# Patient Record
Sex: Male | Born: 1935
Health system: Southern US, Community
[De-identification: ages and names within clinical notes are randomized; demographics above are authoritative.]

## PROBLEM LIST (undated history)

## (undated) DIAGNOSIS — I480 Paroxysmal atrial fibrillation: Secondary | ICD-10-CM

## (undated) DIAGNOSIS — E785 Hyperlipidemia, unspecified: Secondary | ICD-10-CM

## (undated) DIAGNOSIS — Z972 Presence of dental prosthetic device (complete) (partial): Secondary | ICD-10-CM

## (undated) DIAGNOSIS — I10 Essential (primary) hypertension: Secondary | ICD-10-CM

## (undated) DIAGNOSIS — K219 Gastro-esophageal reflux disease without esophagitis: Secondary | ICD-10-CM

## (undated) DIAGNOSIS — E119 Type 2 diabetes mellitus without complications: Secondary | ICD-10-CM

## (undated) DIAGNOSIS — M109 Gout, unspecified: Secondary | ICD-10-CM

## (undated) DIAGNOSIS — E039 Hypothyroidism, unspecified: Secondary | ICD-10-CM

## (undated) HISTORY — PX: HERNIA REPAIR: SHX51

## (undated) HISTORY — PX: TONSILLECTOMY: SUR1361

## (undated) HISTORY — PX: COLONOSCOPY: SHX174

---

## 2007-09-18 ENCOUNTER — Ambulatory Visit: Payer: Self-pay | Admitting: Unknown Physician Specialty

## 2011-01-18 ENCOUNTER — Ambulatory Visit: Payer: Self-pay | Admitting: Unknown Physician Specialty

## 2012-08-01 DIAGNOSIS — N411 Chronic prostatitis: Secondary | ICD-10-CM | POA: Insufficient documentation

## 2012-08-01 DIAGNOSIS — N529 Male erectile dysfunction, unspecified: Secondary | ICD-10-CM | POA: Insufficient documentation

## 2012-08-01 DIAGNOSIS — R972 Elevated prostate specific antigen [PSA]: Secondary | ICD-10-CM | POA: Insufficient documentation

## 2012-08-01 DIAGNOSIS — N390 Urinary tract infection, site not specified: Secondary | ICD-10-CM | POA: Insufficient documentation

## 2012-08-01 DIAGNOSIS — R339 Retention of urine, unspecified: Secondary | ICD-10-CM | POA: Insufficient documentation

## 2012-08-01 DIAGNOSIS — N138 Other obstructive and reflux uropathy: Secondary | ICD-10-CM | POA: Insufficient documentation

## 2012-08-01 DIAGNOSIS — N509 Disorder of male genital organs, unspecified: Secondary | ICD-10-CM | POA: Insufficient documentation

## 2015-11-13 DIAGNOSIS — E1122 Type 2 diabetes mellitus with diabetic chronic kidney disease: Secondary | ICD-10-CM | POA: Diagnosis not present

## 2015-11-13 DIAGNOSIS — R195 Other fecal abnormalities: Secondary | ICD-10-CM | POA: Diagnosis not present

## 2015-11-13 DIAGNOSIS — N183 Chronic kidney disease, stage 3 (moderate): Secondary | ICD-10-CM | POA: Diagnosis not present

## 2015-11-13 DIAGNOSIS — R0789 Other chest pain: Secondary | ICD-10-CM | POA: Diagnosis not present

## 2015-11-13 DIAGNOSIS — N400 Enlarged prostate without lower urinary tract symptoms: Secondary | ICD-10-CM | POA: Diagnosis not present

## 2015-11-13 DIAGNOSIS — Z794 Long term (current) use of insulin: Secondary | ICD-10-CM | POA: Diagnosis not present

## 2015-11-13 DIAGNOSIS — I10 Essential (primary) hypertension: Secondary | ICD-10-CM | POA: Diagnosis not present

## 2015-11-13 DIAGNOSIS — E7800 Pure hypercholesterolemia, unspecified: Secondary | ICD-10-CM | POA: Diagnosis not present

## 2015-11-20 DIAGNOSIS — Z0000 Encounter for general adult medical examination without abnormal findings: Secondary | ICD-10-CM | POA: Diagnosis not present

## 2015-11-20 DIAGNOSIS — Z794 Long term (current) use of insulin: Secondary | ICD-10-CM | POA: Insufficient documentation

## 2015-11-20 DIAGNOSIS — E1122 Type 2 diabetes mellitus with diabetic chronic kidney disease: Secondary | ICD-10-CM | POA: Insufficient documentation

## 2015-11-20 DIAGNOSIS — I10 Essential (primary) hypertension: Secondary | ICD-10-CM | POA: Diagnosis not present

## 2015-11-20 DIAGNOSIS — E039 Hypothyroidism, unspecified: Secondary | ICD-10-CM | POA: Diagnosis not present

## 2015-11-20 DIAGNOSIS — Z23 Encounter for immunization: Secondary | ICD-10-CM | POA: Diagnosis not present

## 2015-11-20 DIAGNOSIS — E1140 Type 2 diabetes mellitus with diabetic neuropathy, unspecified: Secondary | ICD-10-CM | POA: Diagnosis not present

## 2015-11-20 DIAGNOSIS — E7800 Pure hypercholesterolemia, unspecified: Secondary | ICD-10-CM | POA: Diagnosis not present

## 2016-03-11 DIAGNOSIS — E039 Hypothyroidism, unspecified: Secondary | ICD-10-CM | POA: Diagnosis not present

## 2016-03-11 DIAGNOSIS — E1140 Type 2 diabetes mellitus with diabetic neuropathy, unspecified: Secondary | ICD-10-CM | POA: Diagnosis not present

## 2016-03-11 DIAGNOSIS — E7800 Pure hypercholesterolemia, unspecified: Secondary | ICD-10-CM | POA: Diagnosis not present

## 2016-03-11 DIAGNOSIS — Z23 Encounter for immunization: Secondary | ICD-10-CM | POA: Diagnosis not present

## 2016-03-11 DIAGNOSIS — Z794 Long term (current) use of insulin: Secondary | ICD-10-CM | POA: Diagnosis not present

## 2016-03-11 DIAGNOSIS — I10 Essential (primary) hypertension: Secondary | ICD-10-CM | POA: Diagnosis not present

## 2016-03-11 DIAGNOSIS — Z0000 Encounter for general adult medical examination without abnormal findings: Secondary | ICD-10-CM | POA: Diagnosis not present

## 2016-03-18 DIAGNOSIS — E1140 Type 2 diabetes mellitus with diabetic neuropathy, unspecified: Secondary | ICD-10-CM | POA: Diagnosis not present

## 2016-03-18 DIAGNOSIS — Z794 Long term (current) use of insulin: Secondary | ICD-10-CM | POA: Diagnosis not present

## 2016-03-18 DIAGNOSIS — E1122 Type 2 diabetes mellitus with diabetic chronic kidney disease: Secondary | ICD-10-CM | POA: Diagnosis not present

## 2016-03-18 DIAGNOSIS — N400 Enlarged prostate without lower urinary tract symptoms: Secondary | ICD-10-CM | POA: Diagnosis not present

## 2016-03-18 DIAGNOSIS — Z23 Encounter for immunization: Secondary | ICD-10-CM | POA: Diagnosis not present

## 2016-03-18 DIAGNOSIS — N183 Chronic kidney disease, stage 3 (moderate): Secondary | ICD-10-CM | POA: Diagnosis not present

## 2016-03-18 DIAGNOSIS — I10 Essential (primary) hypertension: Secondary | ICD-10-CM | POA: Diagnosis not present

## 2016-03-18 DIAGNOSIS — E039 Hypothyroidism, unspecified: Secondary | ICD-10-CM | POA: Diagnosis not present

## 2016-03-18 DIAGNOSIS — E7800 Pure hypercholesterolemia, unspecified: Secondary | ICD-10-CM | POA: Diagnosis not present

## 2016-05-19 DIAGNOSIS — I10 Essential (primary) hypertension: Secondary | ICD-10-CM | POA: Diagnosis not present

## 2016-05-19 DIAGNOSIS — E119 Type 2 diabetes mellitus without complications: Secondary | ICD-10-CM | POA: Diagnosis not present

## 2016-05-19 DIAGNOSIS — E7800 Pure hypercholesterolemia, unspecified: Secondary | ICD-10-CM | POA: Diagnosis not present

## 2016-05-19 DIAGNOSIS — M109 Gout, unspecified: Secondary | ICD-10-CM | POA: Diagnosis not present

## 2016-05-25 DIAGNOSIS — K921 Melena: Secondary | ICD-10-CM | POA: Diagnosis not present

## 2016-05-26 DIAGNOSIS — N411 Chronic prostatitis: Secondary | ICD-10-CM | POA: Diagnosis not present

## 2016-05-26 DIAGNOSIS — R339 Retention of urine, unspecified: Secondary | ICD-10-CM | POA: Diagnosis not present

## 2016-05-26 DIAGNOSIS — R972 Elevated prostate specific antigen [PSA]: Secondary | ICD-10-CM | POA: Diagnosis not present

## 2016-05-26 DIAGNOSIS — N403 Nodular prostate with lower urinary tract symptoms: Secondary | ICD-10-CM | POA: Diagnosis not present

## 2016-05-26 DIAGNOSIS — N138 Other obstructive and reflux uropathy: Secondary | ICD-10-CM | POA: Diagnosis not present

## 2016-05-31 DIAGNOSIS — K921 Melena: Secondary | ICD-10-CM | POA: Diagnosis not present

## 2016-06-23 DIAGNOSIS — E119 Type 2 diabetes mellitus without complications: Secondary | ICD-10-CM | POA: Diagnosis not present

## 2016-06-28 DIAGNOSIS — D649 Anemia, unspecified: Secondary | ICD-10-CM | POA: Diagnosis not present

## 2016-07-15 DIAGNOSIS — I10 Essential (primary) hypertension: Secondary | ICD-10-CM | POA: Diagnosis not present

## 2016-07-15 DIAGNOSIS — Z794 Long term (current) use of insulin: Secondary | ICD-10-CM | POA: Diagnosis not present

## 2016-07-15 DIAGNOSIS — E1140 Type 2 diabetes mellitus with diabetic neuropathy, unspecified: Secondary | ICD-10-CM | POA: Diagnosis not present

## 2016-07-15 DIAGNOSIS — N400 Enlarged prostate without lower urinary tract symptoms: Secondary | ICD-10-CM | POA: Diagnosis not present

## 2016-07-15 DIAGNOSIS — E7800 Pure hypercholesterolemia, unspecified: Secondary | ICD-10-CM | POA: Diagnosis not present

## 2016-07-18 DIAGNOSIS — E039 Hypothyroidism, unspecified: Secondary | ICD-10-CM | POA: Diagnosis not present

## 2016-07-18 DIAGNOSIS — Z8739 Personal history of other diseases of the musculoskeletal system and connective tissue: Secondary | ICD-10-CM | POA: Diagnosis not present

## 2016-07-18 DIAGNOSIS — E119 Type 2 diabetes mellitus without complications: Secondary | ICD-10-CM | POA: Diagnosis not present

## 2016-07-18 DIAGNOSIS — I10 Essential (primary) hypertension: Secondary | ICD-10-CM | POA: Diagnosis not present

## 2016-07-18 DIAGNOSIS — Z794 Long term (current) use of insulin: Secondary | ICD-10-CM | POA: Diagnosis not present

## 2016-07-18 DIAGNOSIS — E7800 Pure hypercholesterolemia, unspecified: Secondary | ICD-10-CM | POA: Diagnosis not present

## 2016-07-18 DIAGNOSIS — Z0000 Encounter for general adult medical examination without abnormal findings: Secondary | ICD-10-CM | POA: Diagnosis not present

## 2016-11-17 DIAGNOSIS — I10 Essential (primary) hypertension: Secondary | ICD-10-CM | POA: Diagnosis not present

## 2016-11-17 DIAGNOSIS — Z8739 Personal history of other diseases of the musculoskeletal system and connective tissue: Secondary | ICD-10-CM | POA: Diagnosis not present

## 2016-11-17 DIAGNOSIS — E7800 Pure hypercholesterolemia, unspecified: Secondary | ICD-10-CM | POA: Diagnosis not present

## 2016-11-17 DIAGNOSIS — E039 Hypothyroidism, unspecified: Secondary | ICD-10-CM | POA: Diagnosis not present

## 2016-11-17 DIAGNOSIS — E119 Type 2 diabetes mellitus without complications: Secondary | ICD-10-CM | POA: Diagnosis not present

## 2016-11-17 DIAGNOSIS — Z794 Long term (current) use of insulin: Secondary | ICD-10-CM | POA: Diagnosis not present

## 2016-11-24 DIAGNOSIS — E119 Type 2 diabetes mellitus without complications: Secondary | ICD-10-CM | POA: Diagnosis not present

## 2016-11-24 DIAGNOSIS — E7800 Pure hypercholesterolemia, unspecified: Secondary | ICD-10-CM | POA: Diagnosis not present

## 2016-11-24 DIAGNOSIS — D649 Anemia, unspecified: Secondary | ICD-10-CM | POA: Diagnosis not present

## 2016-11-24 DIAGNOSIS — I10 Essential (primary) hypertension: Secondary | ICD-10-CM | POA: Diagnosis not present

## 2016-11-24 DIAGNOSIS — Z8739 Personal history of other diseases of the musculoskeletal system and connective tissue: Secondary | ICD-10-CM | POA: Diagnosis not present

## 2016-11-24 DIAGNOSIS — Z0000 Encounter for general adult medical examination without abnormal findings: Secondary | ICD-10-CM | POA: Diagnosis not present

## 2016-11-24 DIAGNOSIS — Z794 Long term (current) use of insulin: Secondary | ICD-10-CM | POA: Diagnosis not present

## 2017-03-22 DIAGNOSIS — E119 Type 2 diabetes mellitus without complications: Secondary | ICD-10-CM | POA: Diagnosis not present

## 2017-03-22 DIAGNOSIS — Z8739 Personal history of other diseases of the musculoskeletal system and connective tissue: Secondary | ICD-10-CM | POA: Diagnosis not present

## 2017-03-22 DIAGNOSIS — I10 Essential (primary) hypertension: Secondary | ICD-10-CM | POA: Diagnosis not present

## 2017-03-22 DIAGNOSIS — D649 Anemia, unspecified: Secondary | ICD-10-CM | POA: Diagnosis not present

## 2017-03-22 DIAGNOSIS — Z0000 Encounter for general adult medical examination without abnormal findings: Secondary | ICD-10-CM | POA: Diagnosis not present

## 2017-03-22 DIAGNOSIS — Z794 Long term (current) use of insulin: Secondary | ICD-10-CM | POA: Diagnosis not present

## 2017-03-22 DIAGNOSIS — E7800 Pure hypercholesterolemia, unspecified: Secondary | ICD-10-CM | POA: Diagnosis not present

## 2017-03-29 DIAGNOSIS — E039 Hypothyroidism, unspecified: Secondary | ICD-10-CM | POA: Diagnosis not present

## 2017-03-29 DIAGNOSIS — Z794 Long term (current) use of insulin: Secondary | ICD-10-CM | POA: Diagnosis not present

## 2017-03-29 DIAGNOSIS — E7800 Pure hypercholesterolemia, unspecified: Secondary | ICD-10-CM | POA: Diagnosis not present

## 2017-03-29 DIAGNOSIS — E1122 Type 2 diabetes mellitus with diabetic chronic kidney disease: Secondary | ICD-10-CM | POA: Diagnosis not present

## 2017-03-29 DIAGNOSIS — I10 Essential (primary) hypertension: Secondary | ICD-10-CM | POA: Diagnosis not present

## 2017-03-29 DIAGNOSIS — Z8739 Personal history of other diseases of the musculoskeletal system and connective tissue: Secondary | ICD-10-CM | POA: Diagnosis not present

## 2017-03-29 DIAGNOSIS — N183 Chronic kidney disease, stage 3 (moderate): Secondary | ICD-10-CM | POA: Diagnosis not present

## 2017-07-24 DIAGNOSIS — N183 Chronic kidney disease, stage 3 (moderate): Secondary | ICD-10-CM | POA: Diagnosis not present

## 2017-07-24 DIAGNOSIS — E7800 Pure hypercholesterolemia, unspecified: Secondary | ICD-10-CM | POA: Diagnosis not present

## 2017-07-24 DIAGNOSIS — I10 Essential (primary) hypertension: Secondary | ICD-10-CM | POA: Diagnosis not present

## 2017-07-24 DIAGNOSIS — Z794 Long term (current) use of insulin: Secondary | ICD-10-CM | POA: Diagnosis not present

## 2017-07-24 DIAGNOSIS — E1122 Type 2 diabetes mellitus with diabetic chronic kidney disease: Secondary | ICD-10-CM | POA: Diagnosis not present

## 2017-07-24 DIAGNOSIS — Z8739 Personal history of other diseases of the musculoskeletal system and connective tissue: Secondary | ICD-10-CM | POA: Diagnosis not present

## 2017-07-24 DIAGNOSIS — E039 Hypothyroidism, unspecified: Secondary | ICD-10-CM | POA: Diagnosis not present

## 2017-07-31 DIAGNOSIS — Z8739 Personal history of other diseases of the musculoskeletal system and connective tissue: Secondary | ICD-10-CM | POA: Diagnosis not present

## 2017-07-31 DIAGNOSIS — G8929 Other chronic pain: Secondary | ICD-10-CM | POA: Diagnosis not present

## 2017-07-31 DIAGNOSIS — E7800 Pure hypercholesterolemia, unspecified: Secondary | ICD-10-CM | POA: Diagnosis not present

## 2017-07-31 DIAGNOSIS — M25512 Pain in left shoulder: Secondary | ICD-10-CM | POA: Diagnosis not present

## 2017-07-31 DIAGNOSIS — I10 Essential (primary) hypertension: Secondary | ICD-10-CM | POA: Diagnosis not present

## 2017-07-31 DIAGNOSIS — E1140 Type 2 diabetes mellitus with diabetic neuropathy, unspecified: Secondary | ICD-10-CM | POA: Diagnosis not present

## 2017-07-31 DIAGNOSIS — Z794 Long term (current) use of insulin: Secondary | ICD-10-CM | POA: Diagnosis not present

## 2017-08-03 DIAGNOSIS — N411 Chronic prostatitis: Secondary | ICD-10-CM | POA: Diagnosis not present

## 2017-08-03 DIAGNOSIS — R972 Elevated prostate specific antigen [PSA]: Secondary | ICD-10-CM | POA: Diagnosis not present

## 2017-08-03 DIAGNOSIS — R339 Retention of urine, unspecified: Secondary | ICD-10-CM | POA: Diagnosis not present

## 2017-08-03 DIAGNOSIS — N138 Other obstructive and reflux uropathy: Secondary | ICD-10-CM | POA: Diagnosis not present

## 2017-08-03 DIAGNOSIS — N403 Nodular prostate with lower urinary tract symptoms: Secondary | ICD-10-CM | POA: Diagnosis not present

## 2017-08-04 DIAGNOSIS — H2513 Age-related nuclear cataract, bilateral: Secondary | ICD-10-CM | POA: Diagnosis not present

## 2018-02-22 DIAGNOSIS — H2513 Age-related nuclear cataract, bilateral: Secondary | ICD-10-CM | POA: Diagnosis not present

## 2018-02-23 DIAGNOSIS — H2512 Age-related nuclear cataract, left eye: Secondary | ICD-10-CM | POA: Diagnosis not present

## 2018-03-09 DIAGNOSIS — E1140 Type 2 diabetes mellitus with diabetic neuropathy, unspecified: Secondary | ICD-10-CM | POA: Diagnosis not present

## 2018-03-09 DIAGNOSIS — Z794 Long term (current) use of insulin: Secondary | ICD-10-CM | POA: Diagnosis not present

## 2018-03-09 DIAGNOSIS — Z8739 Personal history of other diseases of the musculoskeletal system and connective tissue: Secondary | ICD-10-CM | POA: Diagnosis not present

## 2018-03-09 DIAGNOSIS — G8929 Other chronic pain: Secondary | ICD-10-CM | POA: Diagnosis not present

## 2018-03-09 DIAGNOSIS — I10 Essential (primary) hypertension: Secondary | ICD-10-CM | POA: Diagnosis not present

## 2018-03-09 DIAGNOSIS — E7800 Pure hypercholesterolemia, unspecified: Secondary | ICD-10-CM | POA: Diagnosis not present

## 2018-03-09 DIAGNOSIS — M25512 Pain in left shoulder: Secondary | ICD-10-CM | POA: Diagnosis not present

## 2018-03-16 DIAGNOSIS — I10 Essential (primary) hypertension: Secondary | ICD-10-CM | POA: Diagnosis not present

## 2018-03-16 DIAGNOSIS — Z0000 Encounter for general adult medical examination without abnormal findings: Secondary | ICD-10-CM | POA: Diagnosis not present

## 2018-03-16 DIAGNOSIS — E1140 Type 2 diabetes mellitus with diabetic neuropathy, unspecified: Secondary | ICD-10-CM | POA: Diagnosis not present

## 2018-03-16 DIAGNOSIS — Z794 Long term (current) use of insulin: Secondary | ICD-10-CM | POA: Diagnosis not present

## 2018-03-16 DIAGNOSIS — Z8739 Personal history of other diseases of the musculoskeletal system and connective tissue: Secondary | ICD-10-CM | POA: Diagnosis not present

## 2018-03-16 DIAGNOSIS — N400 Enlarged prostate without lower urinary tract symptoms: Secondary | ICD-10-CM | POA: Diagnosis not present

## 2018-03-16 DIAGNOSIS — E7800 Pure hypercholesterolemia, unspecified: Secondary | ICD-10-CM | POA: Diagnosis not present

## 2018-04-03 DIAGNOSIS — H2512 Age-related nuclear cataract, left eye: Secondary | ICD-10-CM | POA: Diagnosis not present

## 2018-04-09 ENCOUNTER — Encounter: Payer: Self-pay | Admitting: *Deleted

## 2018-04-09 ENCOUNTER — Other Ambulatory Visit: Payer: Self-pay

## 2018-04-09 DIAGNOSIS — I10 Essential (primary) hypertension: Secondary | ICD-10-CM | POA: Insufficient documentation

## 2018-04-09 DIAGNOSIS — E039 Hypothyroidism, unspecified: Secondary | ICD-10-CM | POA: Insufficient documentation

## 2018-04-09 DIAGNOSIS — I152 Hypertension secondary to endocrine disorders: Secondary | ICD-10-CM | POA: Insufficient documentation

## 2018-04-09 DIAGNOSIS — E1169 Type 2 diabetes mellitus with other specified complication: Secondary | ICD-10-CM | POA: Insufficient documentation

## 2018-04-09 DIAGNOSIS — E785 Hyperlipidemia, unspecified: Secondary | ICD-10-CM | POA: Insufficient documentation

## 2018-04-09 DIAGNOSIS — N400 Enlarged prostate without lower urinary tract symptoms: Secondary | ICD-10-CM | POA: Insufficient documentation

## 2018-04-09 NOTE — Progress Notes (Signed)
04/10/2018 10:20 AM   Glen Martinez [birth year] [id number]  Referring provider: Tracie Harrier, MD [address] [facility] [place], [place] [address]  Chief Complaint  Patient presents with  . Benign Prostatic Hypertrophy    HPI: Patient is an 84 year old male with BPH who was referred by Dr. Ginette Martinez for further evaluation.  His only complaint is intermittency that occurs occasionally.  He is currently on tamsulosin 0.4 mg daily and finasteride daily.  Patient denies any gross hematuria, dysuria or suprapubic/flank pain.  Patient denies any fevers, chills, nausea or vomiting.   His PVR 45 mL.    He does not have a history of prostate surgery, nephrolithiasis or family history of prostate cancer.    PMH: Past Medical History:  Diagnosis Date  . Diabetes mellitus without complication ([place])   . GERD (gastroesophageal reflux disease)   . Gout   . Hyperlipidemia   . Hypertension   . Hypothyroidism   . Wears dentures    partial upper and lower    Surgical History: Past Surgical History:  Procedure Laterality Date  . COLONOSCOPY    . HERNIA REPAIR    . TONSILLECTOMY      Home Medications:  Allergies as of 04/10/2018   No Known Allergies     Medication List        Accurate as of 04/10/18 10:20 AM. Always use your most recent med list.          allopurinol 100 MG tablet Commonly known as:  ZYLOPRIM Take 100 mg by mouth daily.   aspirin 81 MG tablet Take 81 mg by mouth daily.   finasteride 5 MG tablet Commonly known as:  PROSCAR Take 5 mg by mouth daily.   gabapentin 100 MG capsule Commonly known as:  NEURONTIN Take 100 mg by mouth daily.   insulin NPH-regular Human (70-30) 100 UNIT/ML injection Commonly known as:  NOVOLIN 70/30 Inject into the skin. 28 units at breakfast. 8 units at bedtime.   levothyroxine 75 MCG tablet Commonly known as:  SYNTHROID, LEVOTHROID Take 75 mcg by mouth daily before breakfast.   lisinopril 40 MG  tablet Commonly known as:  PRINIVIL,ZESTRIL Take 40 mg by mouth daily.   metoprolol tartrate 100 MG tablet Commonly known as:  LOPRESSOR Take 100 mg by mouth daily.   omeprazole 20 MG capsule Commonly known as:  PRILOSEC Take 20 mg by mouth daily.   simvastatin 40 MG tablet Commonly known as:  ZOCOR Take 40 mg by mouth daily.       Allergies: No Known Allergies  Family History: History reviewed. No pertinent family history.  Social History:  reports that he has never smoked. He has never used smokeless tobacco. He reports that he drinks alcohol. His drug history is not on file.  ROS: UROLOGY Frequent Urination?: No Hard to postpone urination?: No Burning/pain with urination?: No Get up at night to urinate?: No Leakage of urine?: No Urine stream starts and stops?: No Trouble starting stream?: No Do you have to strain to urinate?: No Blood in urine?: No Urinary tract infection?: No Sexually transmitted disease?: No Injury to kidneys or bladder?: No Painful intercourse?: No Weak stream?: No Erection problems?: No Penile pain?: No  Gastrointestinal Nausea?: No Vomiting?: No Indigestion/heartburn?: No Diarrhea?: No Constipation?: No  Constitutional Fever: No Night sweats?: No Weight loss?: No Fatigue?: No  Skin Skin rash/lesions?: No Itching?: No  Eyes Blurred vision?: Yes Double vision?: No  Ears/Nose/Throat Sore throat?: No Sinus  problems?: No  Hematologic/Lymphatic Swollen glands?: No Easy bruising?: No  Cardiovascular Leg swelling?: No Chest pain?: No  Respiratory Cough?: No Shortness of breath?: No  Endocrine Excessive thirst?: No  Musculoskeletal Back pain?: No Joint pain?: No  Neurological Headaches?: No Dizziness?: No  Psychologic Depression?: No Anxiety?: No  Physical Exam: BP (!) 195/81   Pulse (!) 59   Resp 16   Ht 5\' 6"  (1.676 m)   Wt 188 lb 9.6 oz (85.5 kg)   SpO2 96%   BMI 30.44 kg/m   Constitutional:   Well nourished. Alert and oriented, No acute distress. HEENT: Glen Martinez AT, moist mucus membranes.  Trachea midline, no masses. Cardiovascular: No clubbing, cyanosis, or edema. Respiratory: Normal respiratory effort, no increased work of breathing. GI: Abdomen is soft, non tender, non distended, no abdominal masses. Liver and spleen not palpable.  No hernias appreciated.  Stool sample for occult testing is not indicated.   GU: No CVA tenderness.  No bladder fullness or masses.  Patient with uncircumcised phallus.  Foreskin easily retracted.   Urethral meatus is patent.  No penile discharge. No penile lesions or rashes. Scrotum without lesions, cysts, rashes and/or edema.  Testicles are located scrotally bilaterally. No masses are appreciated in the testicles. Left and right epididymis are normal. Rectal: Patient with  normal sphincter tone. Anus and perineum without scarring or rashes. No rectal masses are appreciated. Prostate is approximately 60 grams, 3 mm x 5 mm nodule is appreciated on the ridge of the right lobe.  Seminal vesicles are normal. Skin: No rashes, bruises or suspicious lesions. Lymph: No cervical or inguinal adenopathy. Neurologic: Grossly intact, no focal deficits, moving all 4 extremities. Psychiatric: Normal mood and affect.  Laboratory Data: No results found for: WBC, HGB, HCT, MCV, PLT  No results found for: CREATININE  No results found for: PSA  No results found for: TESTOSTERONE  No results found for: HGBA1C  No results found for: TSH  No results found for: CHOL, HDL, CHOLHDL, VLDL, LDLCALC  No results found for: AST No results found for: ALT No components found for: ALKALINEPHOPHATASE No components found for: BILIRUBINTOTAL  No results found for: ESTRADIOL  Urinalysis No results found for: COLORURINE, APPEARANCEUR, LABSPEC, PHURINE, GLUCOSEU, HGBUR, BILIRUBINUR, KETONESUR, PROTEINUR, UROBILINOGEN, NITRITE, LEUKOCYTESUR  I have reviewed the labs.   Pertinent  Imaging: Results for Glen Martinez (MRN [id number]) as of 04/10/2018 10:20  Ref. Range 04/10/2018 10:02  Scan Result Unknown 31ml    Assessment & Plan:    1. BPH with LUTS Continue conservative management, avoiding bladder irritants and timed voiding's Continue tamsulosin 0.4 mg daily and finasteride 5 mg daily RTC in 6 months for IPSS, PSA and exam   2. Prostate nodule Stable per Dr. Bjorn Loser exam Discussed with patient that this may be a prostate cancer and the treatment of prostate cancer would be geared toward palliative care and not curative and it would be appropriate to continue monitoring the patient with biannual DRE's and PSA as long as they remain stable RTC in 6 months for DRE      Return in about 6 months (around 10/11/2018) for IPSS, PSA and exam.  These notes generated with voice recognition software. I apologize for typographical errors.  Zara Council, PA-C  [facility] [address]  [place],  [address] [phone]

## 2018-04-09 NOTE — Discharge Instructions (Signed)

## 2018-04-10 ENCOUNTER — Encounter: Payer: Self-pay | Admitting: Urology

## 2018-04-10 ENCOUNTER — Encounter: Payer: Self-pay | Admitting: Anesthesiology

## 2018-04-10 ENCOUNTER — Ambulatory Visit: Payer: PPO | Admitting: Urology

## 2018-04-10 VITALS — BP 195/81 | HR 59 | Resp 16 | Ht 66.0 in | Wt 188.6 lb

## 2018-04-10 DIAGNOSIS — N402 Nodular prostate without lower urinary tract symptoms: Secondary | ICD-10-CM | POA: Diagnosis not present

## 2018-04-10 DIAGNOSIS — N401 Enlarged prostate with lower urinary tract symptoms: Secondary | ICD-10-CM

## 2018-04-10 LAB — BLADDER SCAN AMB NON-IMAGING

## 2018-04-11 ENCOUNTER — Telehealth: Payer: Self-pay

## 2018-04-11 LAB — PSA: PROSTATE SPECIFIC AG, SERUM: 2.7 ng/mL (ref 0.0–4.0)

## 2018-04-11 NOTE — Telephone Encounter (Signed)
-----   Message from Nori Riis, PA-C sent at 04/11/2018  7:55 AM EDT ----- Please let [name] know that his PSA is stable and we will see him in 6 months.

## 2018-04-11 NOTE — Telephone Encounter (Signed)
Called pt, informed him of lab results. Pt gave verbal understanding.

## 2018-04-17 ENCOUNTER — Encounter
Admission: RE | Disposition: A | Discharge status: Home / Self Care | Payer: Self-pay | Source: Ambulatory Visit | Attending: Ophthalmology

## 2018-04-17 ENCOUNTER — Ambulatory Visit
Admission: RE | Admit: 2018-04-17 | Discharge: 2018-04-17 | Disposition: A | Discharge status: Home / Self Care | Payer: PPO | Source: Ambulatory Visit | Attending: Ophthalmology | Admitting: Ophthalmology

## 2018-04-17 DIAGNOSIS — E039 Hypothyroidism, unspecified: Secondary | ICD-10-CM | POA: Diagnosis not present

## 2018-04-17 DIAGNOSIS — Z538 Procedure and treatment not carried out for other reasons: Secondary | ICD-10-CM | POA: Insufficient documentation

## 2018-04-17 DIAGNOSIS — E7800 Pure hypercholesterolemia, unspecified: Secondary | ICD-10-CM | POA: Diagnosis not present

## 2018-04-17 DIAGNOSIS — I10 Essential (primary) hypertension: Secondary | ICD-10-CM | POA: Diagnosis not present

## 2018-04-17 DIAGNOSIS — H269 Unspecified cataract: Secondary | ICD-10-CM | POA: Diagnosis not present

## 2018-04-17 DIAGNOSIS — N183 Chronic kidney disease, stage 3 (moderate): Secondary | ICD-10-CM | POA: Diagnosis not present

## 2018-04-17 DIAGNOSIS — E1122 Type 2 diabetes mellitus with diabetic chronic kidney disease: Secondary | ICD-10-CM | POA: Diagnosis not present

## 2018-04-17 DIAGNOSIS — Z794 Long term (current) use of insulin: Secondary | ICD-10-CM | POA: Diagnosis not present

## 2018-04-17 HISTORY — DX: Gastro-esophageal reflux disease without esophagitis: K21.9

## 2018-04-17 HISTORY — DX: Presence of dental prosthetic device (complete) (partial): Z97.2

## 2018-04-17 HISTORY — DX: Type 2 diabetes mellitus without complications: E11.9

## 2018-04-17 HISTORY — DX: Hyperlipidemia, unspecified: E78.5

## 2018-04-17 HISTORY — DX: Hypothyroidism, unspecified: E03.9

## 2018-04-17 HISTORY — DX: Essential (primary) hypertension: I10

## 2018-04-17 HISTORY — DX: Gout, unspecified: M10.9

## 2018-04-17 LAB — GLUCOSE, CAPILLARY: Glucose-Capillary: 87 mg/dL (ref 70–99)

## 2018-04-17 SURGERY — PHACOEMULSIFICATION, CATARACT, WITH IOL INSERTION
Anesthesia: Topical | Laterality: Left

## 2018-04-17 SURGICAL SUPPLY — 24 items

## 2018-04-17 NOTE — Progress Notes (Signed)
Canceled due to high blood pressure

## 2018-04-17 NOTE — H&P (Signed)
The History and Physical notes are on paper, have been signed, and are to be scanned.    Surgery canceled due to elevated BP.  Glen Martinez 04/17/2018 10:06 AM

## 2018-04-25 NOTE — Discharge Instructions (Signed)

## 2018-04-30 ENCOUNTER — Encounter: Payer: Self-pay | Admitting: *Deleted

## 2018-04-30 ENCOUNTER — Other Ambulatory Visit: Payer: Self-pay

## 2018-05-02 ENCOUNTER — Ambulatory Visit: Payer: PPO | Admitting: Student in an Organized Health Care Education/Training Program

## 2018-05-02 ENCOUNTER — Ambulatory Visit
Admission: RE | Admit: 2018-05-02 | Discharge: 2018-05-02 | Disposition: A | Discharge status: Home / Self Care | Payer: PPO | Source: Ambulatory Visit | Attending: Ophthalmology | Admitting: Ophthalmology

## 2018-05-02 ENCOUNTER — Encounter
Admission: RE | Disposition: A | Discharge status: Home / Self Care | Payer: Self-pay | Source: Ambulatory Visit | Attending: Ophthalmology

## 2018-05-02 DIAGNOSIS — Z794 Long term (current) use of insulin: Secondary | ICD-10-CM | POA: Insufficient documentation

## 2018-05-02 DIAGNOSIS — H25812 Combined forms of age-related cataract, left eye: Secondary | ICD-10-CM | POA: Diagnosis not present

## 2018-05-02 DIAGNOSIS — E039 Hypothyroidism, unspecified: Secondary | ICD-10-CM | POA: Insufficient documentation

## 2018-05-02 DIAGNOSIS — I10 Essential (primary) hypertension: Secondary | ICD-10-CM | POA: Insufficient documentation

## 2018-05-02 DIAGNOSIS — E1136 Type 2 diabetes mellitus with diabetic cataract: Secondary | ICD-10-CM | POA: Insufficient documentation

## 2018-05-02 DIAGNOSIS — K219 Gastro-esophageal reflux disease without esophagitis: Secondary | ICD-10-CM | POA: Insufficient documentation

## 2018-05-02 DIAGNOSIS — H2512 Age-related nuclear cataract, left eye: Secondary | ICD-10-CM | POA: Insufficient documentation

## 2018-05-02 DIAGNOSIS — Z79899 Other long term (current) drug therapy: Secondary | ICD-10-CM | POA: Diagnosis not present

## 2018-05-02 DIAGNOSIS — E7800 Pure hypercholesterolemia, unspecified: Secondary | ICD-10-CM | POA: Diagnosis not present

## 2018-05-02 HISTORY — PX: CATARACT EXTRACTION W/PHACO: SHX586

## 2018-05-02 LAB — GLUCOSE, CAPILLARY: GLUCOSE-CAPILLARY: 148 mg/dL — AB (ref 70–99)

## 2018-05-02 SURGERY — PHACOEMULSIFICATION, CATARACT, WITH IOL INSERTION
Anesthesia: Monitor Anesthesia Care | Site: Eye | Laterality: Left | Wound class: "Clean "

## 2018-05-02 MED ORDER — NA HYALUR & NA CHOND-NA HYALUR 0.4-0.35 ML IO KIT
PACK | INTRAOCULAR | Status: DC | PRN
Start: 2018-05-02 — End: 2018-05-02
  Administered 2018-05-02: 1 mL via INTRAOCULAR

## 2018-05-02 MED ORDER — MOXIFLOXACIN HCL 0.5 % OP SOLN
1.0000 [drp] | OPHTHALMIC | Status: DC | PRN
  Administered 2018-05-02 (×3): 1 [drp] via OPHTHALMIC

## 2018-05-02 MED ORDER — EPINEPHRINE PF 1 MG/ML IJ SOLN
INTRAOCULAR | Status: DC | PRN
  Administered 2018-05-02: 68 mL via OPHTHALMIC

## 2018-05-02 MED ORDER — LIDOCAINE HCL (PF) 2 % IJ SOLN
INTRAOCULAR | Status: DC | PRN
  Administered 2018-05-02: 1 mL via INTRAMUSCULAR

## 2018-05-02 MED ORDER — PHENYLEPHRINE HCL 10 % OP SOLN
1.0000 [drp] | OPHTHALMIC | Status: DC | PRN
  Administered 2018-05-02 (×3): 1 [drp] via OPHTHALMIC

## 2018-05-02 MED ORDER — MIDAZOLAM HCL 2 MG/2ML IJ SOLN
INTRAMUSCULAR | Status: DC | PRN
  Administered 2018-05-02: 1 mg via INTRAVENOUS

## 2018-05-02 MED ORDER — CYCLOPENTOLATE HCL 2 % OP SOLN
1.0000 [drp] | OPHTHALMIC | Status: DC | PRN
  Administered 2018-05-02 (×3): 1 [drp] via OPHTHALMIC

## 2018-05-02 MED ORDER — CEFUROXIME OPHTHALMIC INJECTION 1 MG/0.1 ML
INJECTION | OPHTHALMIC | Status: DC | PRN
  Administered 2018-05-02: .3 mL via OPHTHALMIC

## 2018-05-02 MED ORDER — BRIMONIDINE TARTRATE-TIMOLOL 0.2-0.5 % OP SOLN
OPHTHALMIC | Status: DC | PRN
Start: 2018-05-02 — End: 2018-05-02
  Administered 2018-05-02: 1 [drp] via OPHTHALMIC

## 2018-05-02 MED ORDER — FENTANYL CITRATE (PF) 100 MCG/2ML IJ SOLN
INTRAMUSCULAR | Status: DC | PRN
  Administered 2018-05-02: 50 ug via INTRAVENOUS

## 2018-05-02 MED ORDER — TETRACAINE HCL 0.5 % OP SOLN
1.0000 [drp] | OPHTHALMIC | Status: DC | PRN
  Administered 2018-05-02 (×2): 1 [drp] via OPHTHALMIC

## 2018-05-02 SURGICAL SUPPLY — 27 items

## 2018-05-02 NOTE — Anesthesia Postprocedure Evaluation (Signed)
Anesthesia Post Note  Patient: Glen Martinez  Procedure(s) Performed: CATARACT EXTRACTION PHACO AND INTRAOCULAR LENS PLACEMENT (IOC) DIABETIC (Left Eye)  Patient location during evaluation: PACU Anesthesia Type: MAC Level of consciousness: awake and alert Pain management: pain level controlled Vital Signs Assessment: post-procedure vital signs reviewed and stable Respiratory status: spontaneous breathing Cardiovascular status: blood pressure returned to baseline Postop Assessment: no headache Anesthetic complications: no    Jaci Standard, III,  Braydee Shimkus D

## 2018-05-02 NOTE — Transfer of Care (Signed)
Immediate Anesthesia Transfer of Care Note  Patient: Glen Martinez  Procedure(s) Performed: CATARACT EXTRACTION PHACO AND INTRAOCULAR LENS PLACEMENT (IOC) DIABETIC (Left Eye)  Patient Location: PACU  Anesthesia Type: MAC  Level of Consciousness: awake, alert  and patient cooperative  Airway and Oxygen Therapy: Patient Spontanous Breathing and Patient connected to supplemental oxygen  Post-op Assessment: Post-op Vital signs reviewed, Patient's Cardiovascular Status Stable, Respiratory Function Stable, Patent Airway and No signs of Nausea or vomiting  Post-op Vital Signs: Reviewed and stable  Complications: No apparent anesthesia complications

## 2018-05-02 NOTE — Anesthesia Procedure Notes (Signed)
Procedure Name: [place] Performed by: Cameron Ali, CRNA Pre-anesthesia Checklist: Patient identified, Emergency Drugs available, Suction available, Timeout performed and Patient being monitored Patient Re-evaluated:Patient Re-evaluated prior to induction Oxygen Delivery Method: Nasal cannula Placement Confirmation: positive ETCO2

## 2018-05-02 NOTE — H&P (Signed)
The History and Physical notes are on paper, have been signed, and are to be scanned. The patient remains stable and unchanged from the H&P.   Previous H&P reviewed, patient examined, and there are no changes.  Glen Martinez 05/02/2018 7:36 AM

## 2018-05-02 NOTE — Anesthesia Preprocedure Evaluation (Signed)
Anesthesia Evaluation  Patient identified by MRN, date of birth, ID band Patient awake    Reviewed: Allergy & Precautions, H&P , NPO status , Patient's Chart, lab work & pertinent test results  Airway Mallampati: II  TM Distance: >3 FB Neck ROM: full    Dental no notable dental hx.    Pulmonary neg pulmonary ROS,    Pulmonary exam normal breath sounds clear to auscultation       Cardiovascular hypertension, On Medications Normal cardiovascular exam     Neuro/Psych negative neurological ROS     GI/Hepatic Neg liver ROS, Medicated,  Endo/Other  diabetes, Well Controlled, Type 2, Insulin DependentHypothyroidism   Renal/GU   negative genitourinary   Musculoskeletal   Abdominal   Peds  Hematology negative hematology ROS (+)   Anesthesia Other Findings   Reproductive/Obstetrics negative OB ROS                             Anesthesia Physical Anesthesia Plan  ASA: II  Anesthesia Plan: MAC   Post-op Pain Management:    Induction:   PONV Risk Score and Plan:   Airway Management Planned:   Additional Equipment:   Intra-op Plan:   Post-operative Plan:   Informed Consent: I have reviewed the patients History and Physical, chart, labs and discussed the procedure including the risks, benefits and alternatives for the proposed anesthesia with the patient or authorized representative who has indicated his/her understanding and acceptance.     Plan Discussed with:   Anesthesia Plan Comments:         Anesthesia Quick Evaluation

## 2018-05-02 NOTE — Op Note (Signed)
OPERATIVE NOTE  KOUROSH JABLONSKY [id number] 05/02/2018   PREOPERATIVE DIAGNOSIS:  Nuclear sclerotic cataract left eye. H25.12   POSTOPERATIVE DIAGNOSIS:    Nuclear sclerotic cataract left eye.     PROCEDURE:  Phacoemusification with posterior chamber intraocular lens placement of the left eye   LENS:   Implant Name Type Inv. Item Serial No. Manufacturer Lot No. LRB No. Used  LENS IOL DIOP 20.5 - [id number] Intraocular Lens LENS IOL DIOP 20.5 [id number] AMO  Left 1        ULTRASOUND TIME: 13  % of 1 minutes 30 seconds, CDE 11.8  SURGEON:  Wyonia Hough, MD   ANESTHESIA:  Topical with tetracaine drops and 2% Xylocaine jelly, augmented with 1% preservative-free intracameral lidocaine.    COMPLICATIONS:  None.   DESCRIPTION OF PROCEDURE:  The patient was identified in the holding room and transported to the operating room and placed in the supine position under the operating microscope.  The left eye was identified as the operative eye and it was prepped and draped in the usual sterile ophthalmic fashion.   A 1 millimeter clear-corneal paracentesis was made at the 1:30 position.  0.5 ml of preservative-free 1% lidocaine was injected into the anterior chamber.  The anterior chamber was filled with Viscoat viscoelastic.  A 2.4 millimeter keratome was used to make a near-clear corneal incision at the 10:30 position.  .  A curvilinear capsulorrhexis was made with a cystotome and capsulorrhexis forceps.  Balanced salt solution was used to hydrodissect and hydrodelineate the nucleus.   Phacoemulsification was then used in stop and chop fashion to remove the lens nucleus and epinucleus.  The remaining cortex was then removed using the irrigation and aspiration handpiece. Provisc was then placed into the capsular bag to distend it for lens placement.  A lens was then injected into the capsular bag.  The remaining viscoelastic was aspirated.   Wounds were hydrated with balanced salt  solution.  The anterior chamber was inflated to a physiologic pressure with balanced salt solution.  No wound leaks were noted. Cefuroxime 0.1 ml of a 10mg /ml solution was injected into the anterior chamber for a dose of 1 mg of intracameral antibiotic at the completion of the case.   Timolol and Brimonidine drops were applied to the eye.  The patient was taken to the recovery room in stable condition without complications of anesthesia or surgery.  Koen Antilla 05/02/2018, 8:05 AM

## 2018-05-03 ENCOUNTER — Encounter: Payer: Self-pay | Admitting: Ophthalmology

## 2018-07-16 DIAGNOSIS — I10 Essential (primary) hypertension: Secondary | ICD-10-CM | POA: Diagnosis not present

## 2018-07-16 DIAGNOSIS — E7800 Pure hypercholesterolemia, unspecified: Secondary | ICD-10-CM | POA: Diagnosis not present

## 2018-07-16 DIAGNOSIS — E1140 Type 2 diabetes mellitus with diabetic neuropathy, unspecified: Secondary | ICD-10-CM | POA: Diagnosis not present

## 2018-07-16 DIAGNOSIS — N400 Enlarged prostate without lower urinary tract symptoms: Secondary | ICD-10-CM | POA: Diagnosis not present

## 2018-07-16 DIAGNOSIS — Z8739 Personal history of other diseases of the musculoskeletal system and connective tissue: Secondary | ICD-10-CM | POA: Diagnosis not present

## 2018-07-16 DIAGNOSIS — Z794 Long term (current) use of insulin: Secondary | ICD-10-CM | POA: Diagnosis not present

## 2018-07-16 DIAGNOSIS — Z0000 Encounter for general adult medical examination without abnormal findings: Secondary | ICD-10-CM | POA: Diagnosis not present

## 2018-07-18 DIAGNOSIS — Z8739 Personal history of other diseases of the musculoskeletal system and connective tissue: Secondary | ICD-10-CM | POA: Diagnosis not present

## 2018-07-18 DIAGNOSIS — E7800 Pure hypercholesterolemia, unspecified: Secondary | ICD-10-CM | POA: Diagnosis not present

## 2018-07-18 DIAGNOSIS — Z794 Long term (current) use of insulin: Secondary | ICD-10-CM | POA: Diagnosis not present

## 2018-07-18 DIAGNOSIS — E1140 Type 2 diabetes mellitus with diabetic neuropathy, unspecified: Secondary | ICD-10-CM | POA: Diagnosis not present

## 2018-07-18 DIAGNOSIS — N182 Chronic kidney disease, stage 2 (mild): Secondary | ICD-10-CM | POA: Diagnosis not present

## 2018-07-18 DIAGNOSIS — N400 Enlarged prostate without lower urinary tract symptoms: Secondary | ICD-10-CM | POA: Diagnosis not present

## 2018-07-18 DIAGNOSIS — I10 Essential (primary) hypertension: Secondary | ICD-10-CM | POA: Diagnosis not present

## 2018-07-18 DIAGNOSIS — E1122 Type 2 diabetes mellitus with diabetic chronic kidney disease: Secondary | ICD-10-CM | POA: Diagnosis not present

## 2018-10-15 ENCOUNTER — Encounter: Payer: Self-pay | Admitting: Urology

## 2018-10-15 ENCOUNTER — Other Ambulatory Visit: Payer: Self-pay | Admitting: Family Medicine

## 2018-10-15 ENCOUNTER — Other Ambulatory Visit: Payer: PPO

## 2018-10-15 DIAGNOSIS — N401 Enlarged prostate with lower urinary tract symptoms: Secondary | ICD-10-CM

## 2018-10-17 ENCOUNTER — Ambulatory Visit: Payer: PPO | Admitting: Urology

## 2018-10-17 ENCOUNTER — Encounter: Payer: Self-pay | Admitting: Urology

## 2018-11-09 ENCOUNTER — Other Ambulatory Visit: Payer: PPO

## 2018-11-09 DIAGNOSIS — N401 Enlarged prostate with lower urinary tract symptoms: Secondary | ICD-10-CM

## 2018-11-10 LAB — PSA: Prostate Specific Ag, Serum: 2.9 ng/mL (ref 0.0–4.0)

## 2018-11-13 DIAGNOSIS — N400 Enlarged prostate without lower urinary tract symptoms: Secondary | ICD-10-CM | POA: Diagnosis not present

## 2018-11-13 DIAGNOSIS — I10 Essential (primary) hypertension: Secondary | ICD-10-CM | POA: Diagnosis not present

## 2018-11-13 DIAGNOSIS — E1140 Type 2 diabetes mellitus with diabetic neuropathy, unspecified: Secondary | ICD-10-CM | POA: Diagnosis not present

## 2018-11-13 DIAGNOSIS — N182 Chronic kidney disease, stage 2 (mild): Secondary | ICD-10-CM | POA: Diagnosis not present

## 2018-11-13 DIAGNOSIS — Z794 Long term (current) use of insulin: Secondary | ICD-10-CM | POA: Diagnosis not present

## 2018-11-13 DIAGNOSIS — Z8739 Personal history of other diseases of the musculoskeletal system and connective tissue: Secondary | ICD-10-CM | POA: Diagnosis not present

## 2018-11-13 DIAGNOSIS — E1122 Type 2 diabetes mellitus with diabetic chronic kidney disease: Secondary | ICD-10-CM | POA: Diagnosis not present

## 2018-11-13 DIAGNOSIS — E7800 Pure hypercholesterolemia, unspecified: Secondary | ICD-10-CM | POA: Diagnosis not present

## 2018-11-15 ENCOUNTER — Encounter: Payer: Self-pay | Admitting: Urology

## 2018-11-15 ENCOUNTER — Ambulatory Visit: Payer: PPO | Admitting: Urology

## 2018-11-15 VITALS — BP 183/82 | HR 59 | Ht 66.0 in | Wt 185.0 lb

## 2018-11-15 DIAGNOSIS — N401 Enlarged prostate with lower urinary tract symptoms: Secondary | ICD-10-CM

## 2018-11-15 LAB — BLADDER SCAN AMB NON-IMAGING

## 2018-11-15 MED ORDER — SILDENAFIL CITRATE 20 MG PO TABS: ORAL_TABLET | ORAL | 3 refills | Status: DC

## 2018-11-15 NOTE — Progress Notes (Signed)
11/15/2018  10:00 AM   Glen Martinez 03-16-1931 [id number]  Referring provider: Tracie Harrier, MD [address] [facility] [place], [place] [address]  Chief Complaint  Patient presents with  . Benign Prostatic Hypertrophy    HPI: Glen Martinez is a 85 y.o. Black or [place] American male that presents today for routine 65-month evaluation and management. He has a history of BPH with LUTS and prostate nodule. Previously followed by Dr. Jacqlyn Larsen (Last seen 2018), referred by Dr. Ginette Pitman for further evaluation.  Patient's BP was elevated today (183/82). He admits that he checks it daily and was aware of its elevation, usually his systolic runs ~536. He plans to follow up with his PCP within the next couple of weeks.  BPH with LUTS Patient is currently managing his symptoms with tamsulosin and finasteride daily.  I PSS 6/0.  PVR 5 mL.   His major complaint of occasional intermittency has remained relatively the same and he experiences occasional leakage following urination. His I-PSS score reports mild lower urinary symptomatology is delighted with his quality of life due to his urinary symptoms.  Denies dysuria, gross hematuria or flank/abdominal/pelvic/scrotal pain.  IPSS    Row Name 11/15/18 0900         International Prostate Symptom Score   How often have you had the sensation of not emptying your bladder?  Less than half the time     How often have you had to urinate less than every two hours?  Less than half the time     How often have you found you stopped and started again several times when you urinated?  Less than half the time     How often have you found it difficult to postpone urination?  Not at All     How often have you had a weak urinary stream?  Not at All     How often have you had to strain to start urination?  Not at All     How many times did you typically get up at night to urinate?  None     Total IPSS Score  6       Quality of Life due to  urinary symptoms   If you were to spend the rest of your life with your urinary condition just the way it is now how would you feel about that?  Delighted       Prostate Nodule Stable per Dr. Bjorn Loser exam  Last PSA (11/09/2018) was 2.9 ng/mL.  ED Patient reports that sildenafil is an effective aid in achieving and maintaining erections. Denies pain or curvature with erections.  PMH: Past Medical History:  Diagnosis Date  . Diabetes mellitus without complication ([place])   . GERD (gastroesophageal reflux disease)   . Gout   . Hyperlipidemia   . Hypertension   . Hypothyroidism   . Wears dentures    partial upper and lower    Surgical History: Past Surgical History:  Procedure Laterality Date  . CATARACT EXTRACTION W/PHACO Left 05/02/2018   Procedure: CATARACT EXTRACTION PHACO AND INTRAOCULAR LENS PLACEMENT ([place]) DIABETIC;  Surgeon: Leandrew Koyanagi, MD;  Location: [facility];  Service: Ophthalmology;  Laterality: Left;  diabetic - insulin  . COLONOSCOPY    . HERNIA REPAIR    . TONSILLECTOMY      Home Medications:  Allergies as of 11/15/2018   No Known Allergies     Medication List       Accurate as of  November 15, 2018 10:00 AM. Always use your most recent med list.        allopurinol 100 MG tablet Commonly known as:  ZYLOPRIM Take 100 mg by mouth daily.   aspirin 81 MG tablet Take 81 mg by mouth daily.   finasteride 5 MG tablet Commonly known as:  PROSCAR Take 5 mg by mouth daily.   gabapentin 100 MG capsule Commonly known as:  NEURONTIN Take 100 mg by mouth daily.   hydrochlorothiazide 25 MG tablet Commonly known as:  HYDRODIURIL Take 25 mg by mouth daily.   insulin NPH-regular Human (70-30) 100 UNIT/ML injection Inject into the skin. 28 units at breakfast. 8 units at bedtime.   INSULIN SYRINGE .3CC/31GX5/16" 31G X 5/16" 0.3 ML Misc TWICE DAILY   ULTICARE INSULIN SYRINGE 31G X 5/16" 0.3 ML Misc Generic drug:  Insulin Syringe-Needle U-100     levothyroxine 75 MCG tablet Commonly known as:  SYNTHROID, LEVOTHROID Take 75 mcg by mouth daily before breakfast.   lisinopril 40 MG tablet Commonly known as:  PRINIVIL,ZESTRIL Take 40 mg by mouth daily.   metoprolol succinate 100 MG 24 hr tablet Commonly known as:  TOPROL-XL Take by mouth.   metoprolol tartrate 100 MG tablet Commonly known as:  LOPRESSOR Take 100 mg by mouth daily.   omeprazole 20 MG capsule Commonly known as:  PRILOSEC Take 20 mg by mouth daily.   sildenafil 20 MG tablet Commonly known as:  REVATIO Take 3 to 5 tablets two hours before intercouse on an empty stomach.  Do not take with nitrates.   simvastatin 40 MG tablet Commonly known as:  ZOCOR Take 40 mg by mouth daily.       Allergies: No Known Allergies  Family History: No family history on file.  Social History:  reports that he has never smoked. He has never used smokeless tobacco. He reports current alcohol use. No history on file for drug.  ROS: UROLOGY Frequent Urination?: No Hard to postpone urination?: No Burning/pain with urination?: No Get up at night to urinate?: No Leakage of urine?: Yes Urine stream starts and stops?: Yes Trouble starting stream?: No Do you have to strain to urinate?: No Blood in urine?: No Urinary tract infection?: No Sexually transmitted disease?: No Injury to kidneys or bladder?: No Painful intercourse?: No Weak stream?: No Erection problems?: No Penile pain?: No  Gastrointestinal Nausea?: No Vomiting?: No Indigestion/heartburn?: No Diarrhea?: No Constipation?: No  Constitutional Fever: No Night sweats?: No Weight loss?: No Fatigue?: No  Skin Skin rash/lesions?: No Itching?: No  Eyes Blurred vision?: No Double vision?: No  Ears/Nose/Throat Sore throat?: No Sinus problems?: No  Hematologic/Lymphatic Swollen glands?: No Easy bruising?: Yes  Cardiovascular Leg swelling?: No Chest pain?: No  Respiratory Cough?:  No Shortness of breath?: No  Endocrine Excessive thirst?: No  Musculoskeletal Back pain?: No Joint pain?: No  Neurological Headaches?: No Dizziness?: No  Psychologic Depression?: No Anxiety?: No  Physical Exam: BP (!) 183/82 (BP Location: Left Arm, Patient Position: Sitting)   Pulse (!) 59   Ht 5\' 6"  (1.676 m)   Wt 185 lb (83.9 kg)   BMI 29.86 kg/m   Constitutional:  Alert and oriented, No acute distress. Respiratory: Normal respiratory effort, no increased work of breathing. Head: Normocephalic and Atraumatic. GU: No CVA tenderness. Uncircumcised phallus, easily retractable foreskin. Urethral meatus is patient. No penile lesions, rash, or discharge. Scrotum without lesions, cysts, rashes and/pr edema. Testicles located bilaterally, without masses. Normal epididymi. Rectal: Normal sphincter tone. Anus  and perineum without rashes or scaring. Prostate is enlarged, approximately 60 grams, right sided nodule about 5 mm x 5 mm. Seminal vesicle could not be palpated. Skin: No rashes, bruises or suspicious lesions. Neurologic: Grossly intact, no focal deficits, moving all 4 extremities. Psychiatric: Normal mood and affect.  Laboratory Results  Prostate Specific Antigen, Serum Ref: 0.1 - 4 ng/mL  02/07/2013 08:53 AM 2.6  09/04/2013 12:17 PM 2.5  04/02/2014 02:08 PM 2.8  04/10/2015 11:36 AM 2.68  05/26/2016 9:36 AM 2.17  08/03/2017 10:59 AM 2.12  04/10/2018 09:57 AM 2.7  11/09/2018 09:55 AM 2.9   Pertinent Imaging: Results for orders placed or performed in visit on 11/15/18  BLADDER SCAN AMB NON-IMAGING  Result Value Ref Range   Scan Result 35ml    Assessment & Plan:    1. BPH with LUTS  - We discussed methods to attempt prevent post urination leakage/dribbling, I.e. pushing on scrotum  - I-PSS is 6 today  - Patient retains an elevated outlook on his quality of life due to his urinary symptoms as his symptoms are very mild in nature and well managed on  tamsulosin and finasteride   - Plan to continue tamsulosin and finasteride  2. Prostate Nodule  - Palpated on DRE, remains stable  - PSA is 2.9, stable  3. ED  - Refill of sildenafil sent to pharmacy. Patient reports that it is effective in the management of his erectile symptoms.  Return in about 6 months (around 05/16/2019) for IPSS, SHIM, PSA and exam.  Zara Council, PA-C [place] [address], [place], [place] [address] [phone]  I, Temidayo Atanda-[place]leye , am acting as a scribe for [facility], PA-C  I have reviewed the above documentation for accuracy and completeness, and I agree with the above.    Zara Council, PA-C

## 2018-11-20 DIAGNOSIS — D649 Anemia, unspecified: Secondary | ICD-10-CM | POA: Diagnosis not present

## 2018-11-20 DIAGNOSIS — Z8739 Personal history of other diseases of the musculoskeletal system and connective tissue: Secondary | ICD-10-CM | POA: Diagnosis not present

## 2018-11-20 DIAGNOSIS — Z0000 Encounter for general adult medical examination without abnormal findings: Secondary | ICD-10-CM | POA: Diagnosis not present

## 2018-11-20 DIAGNOSIS — I10 Essential (primary) hypertension: Secondary | ICD-10-CM | POA: Diagnosis not present

## 2018-11-20 DIAGNOSIS — E1122 Type 2 diabetes mellitus with diabetic chronic kidney disease: Secondary | ICD-10-CM | POA: Diagnosis not present

## 2018-11-20 DIAGNOSIS — Z794 Long term (current) use of insulin: Secondary | ICD-10-CM | POA: Diagnosis not present

## 2018-11-20 DIAGNOSIS — E039 Hypothyroidism, unspecified: Secondary | ICD-10-CM | POA: Diagnosis not present

## 2018-11-20 DIAGNOSIS — N183 Chronic kidney disease, stage 3 (moderate): Secondary | ICD-10-CM | POA: Diagnosis not present

## 2018-11-20 DIAGNOSIS — N400 Enlarged prostate without lower urinary tract symptoms: Secondary | ICD-10-CM | POA: Diagnosis not present

## 2018-11-20 DIAGNOSIS — E7800 Pure hypercholesterolemia, unspecified: Secondary | ICD-10-CM | POA: Diagnosis not present

## 2019-03-19 DIAGNOSIS — N400 Enlarged prostate without lower urinary tract symptoms: Secondary | ICD-10-CM | POA: Diagnosis not present

## 2019-03-19 DIAGNOSIS — E7800 Pure hypercholesterolemia, unspecified: Secondary | ICD-10-CM | POA: Diagnosis not present

## 2019-03-19 DIAGNOSIS — E039 Hypothyroidism, unspecified: Secondary | ICD-10-CM | POA: Diagnosis not present

## 2019-03-19 DIAGNOSIS — I10 Essential (primary) hypertension: Secondary | ICD-10-CM | POA: Diagnosis not present

## 2019-03-19 DIAGNOSIS — Z8739 Personal history of other diseases of the musculoskeletal system and connective tissue: Secondary | ICD-10-CM | POA: Diagnosis not present

## 2019-03-19 DIAGNOSIS — E1122 Type 2 diabetes mellitus with diabetic chronic kidney disease: Secondary | ICD-10-CM | POA: Diagnosis not present

## 2019-03-19 DIAGNOSIS — N183 Chronic kidney disease, stage 3 (moderate): Secondary | ICD-10-CM | POA: Diagnosis not present

## 2019-03-19 DIAGNOSIS — Z794 Long term (current) use of insulin: Secondary | ICD-10-CM | POA: Diagnosis not present

## 2019-03-19 DIAGNOSIS — E538 Deficiency of other specified B group vitamins: Secondary | ICD-10-CM | POA: Diagnosis not present

## 2019-03-19 DIAGNOSIS — D649 Anemia, unspecified: Secondary | ICD-10-CM | POA: Diagnosis not present

## 2019-03-21 DIAGNOSIS — Z8739 Personal history of other diseases of the musculoskeletal system and connective tissue: Secondary | ICD-10-CM | POA: Diagnosis not present

## 2019-03-21 DIAGNOSIS — N183 Chronic kidney disease, stage 3 (moderate): Secondary | ICD-10-CM | POA: Diagnosis not present

## 2019-03-21 DIAGNOSIS — E039 Hypothyroidism, unspecified: Secondary | ICD-10-CM | POA: Diagnosis not present

## 2019-03-21 DIAGNOSIS — Z794 Long term (current) use of insulin: Secondary | ICD-10-CM | POA: Diagnosis not present

## 2019-03-21 DIAGNOSIS — E1122 Type 2 diabetes mellitus with diabetic chronic kidney disease: Secondary | ICD-10-CM | POA: Diagnosis not present

## 2019-03-21 DIAGNOSIS — Z0000 Encounter for general adult medical examination without abnormal findings: Secondary | ICD-10-CM | POA: Diagnosis not present

## 2019-03-21 DIAGNOSIS — E538 Deficiency of other specified B group vitamins: Secondary | ICD-10-CM | POA: Diagnosis not present

## 2019-03-21 DIAGNOSIS — E7800 Pure hypercholesterolemia, unspecified: Secondary | ICD-10-CM | POA: Diagnosis not present

## 2019-03-25 DIAGNOSIS — E119 Type 2 diabetes mellitus without complications: Secondary | ICD-10-CM | POA: Diagnosis not present

## 2019-05-10 ENCOUNTER — Other Ambulatory Visit: Payer: Self-pay

## 2019-05-10 DIAGNOSIS — R972 Elevated prostate specific antigen [PSA]: Secondary | ICD-10-CM

## 2019-05-13 ENCOUNTER — Other Ambulatory Visit: Payer: PPO

## 2019-05-13 ENCOUNTER — Encounter: Payer: Self-pay | Admitting: Urology

## 2019-05-14 ENCOUNTER — Encounter: Payer: Self-pay | Admitting: Urology

## 2019-05-14 NOTE — Progress Notes (Signed)
05/15/2019  9:37 AM   [name] June 17, 1931 [id number]  Referring provider: Tracie Harrier, MD [address] [facility] [place],  [place] [address]  Chief Complaint  Patient presents with  . Benign Prostatic Hypertrophy    HPI: Glen Martinez is a 85 y.o.  male with BPH and LU TS, prostate nodule and ED that presents today for routine 76-month evaluation and management.   BPH WITH LUTS  (prostate and/or bladder) IPSS score: 2/2     Previous score: 6/0  Previous PVR: 5 mL  Major complaint(s):  No complaints at this time.  Denies any dysuria, hematuria or suprapubic pain.   Currently taking: finasteride 5 mg daily   Denies any recent fevers, chills, nausea or vomiting.  He does not have a family history of PCa.  IPSS    Row Name 05/15/19 0900         International Prostate Symptom Score   How often have you had the sensation of not emptying your bladder?  Not at All     How often have you had to urinate less than every two hours?  Not at All     How often have you found you stopped and started again several times when you urinated?  Less than 1 in 5 times     How often have you found it difficult to postpone urination?  Not at All     How often have you had a weak urinary stream?  Not at All     How often have you had to strain to start urination?  Not at All     How many times did you typically get up at night to urinate?  1 Time     Total IPSS Score  2       Quality of Life due to urinary symptoms   If you were to spend the rest of your life with your urinary condition just the way it is now how would you feel about that?  Mostly Satisfied        Score:  1-7 Mild 8-19 Moderate 20-35 Severe   Prostate Nodule Stable per Dr. Bjorn Loser exam.   Last PSA (11/09/2018) was 2.9 ng/mL.  Erectile dysfunction His SHIM score is 17, which is mild ED.   He has been having difficulty with erections  His risk factors for ED are age, BPH, DM, HTN, HLD and  hypothyroidism.   He denies any painful erections or curvatures with his erections.   He is no longer having spontaneous erections.  He has tried PDE5i in the past with good results.     SHIM    Row Name 05/15/19 [phone]         SHIM: Over the last 6 months:   How do you rate your confidence that you could get and keep an erection?  High     When you had erections with sexual stimulation, how often were your erections hard enough for penetration (entering your partner)?  Almost Never or Never     During sexual intercourse, how often were you able to maintain your erection after you had penetrated (entered) your partner?  Sometimes (about half the time)     During sexual intercourse, how difficult was it to maintain your erection to completion of intercourse?  Not Difficult     When you attempted sexual intercourse, how often was it satisfactory for you?  Most Times (much more than half the time)  SHIM Total Score   SHIM  17        Score: 1-7 Severe ED 8-11 Moderate ED 12-16 Mild-Moderate ED 17-21 Mild ED 22-25 No ED     PMH: Past Medical History:  Diagnosis Date  . Diabetes mellitus without complication ([place])   . GERD (gastroesophageal reflux disease)   . Gout   . Hyperlipidemia   . Hypertension   . Hypothyroidism   . Wears dentures    partial upper and lower    Surgical History: Past Surgical History:  Procedure Laterality Date  . CATARACT EXTRACTION W/PHACO Left 05/02/2018   Procedure: CATARACT EXTRACTION PHACO AND INTRAOCULAR LENS PLACEMENT ([place]) DIABETIC;  Surgeon: Leandrew Koyanagi, MD;  Location: [facility];  Service: Ophthalmology;  Laterality: Left;  diabetic - insulin  . COLONOSCOPY    . HERNIA REPAIR    . TONSILLECTOMY      Home Medications:  Allergies as of 05/15/2019   No Known Allergies     Medication List       Accurate as of May 15, 2019  9:37 AM. If you have any questions, ask your nurse or doctor.        allopurinol 100 MG  tablet Commonly known as: ZYLOPRIM Take 100 mg by mouth daily.   aspirin 81 MG tablet Take 81 mg by mouth daily.   finasteride 5 MG tablet Commonly known as: PROSCAR Take 5 mg by mouth daily.   gabapentin 100 MG capsule Commonly known as: NEURONTIN Take 100 mg by mouth daily.   hydrochlorothiazide 25 MG tablet Commonly known as: HYDRODIURIL Take 25 mg by mouth daily.   insulin NPH-regular Human (70-30) 100 UNIT/ML injection Inject into the skin. 28 units at breakfast. 8 units at bedtime.   INSULIN SYRINGE .3CC/31GX5/16" 31G X 5/16" 0.3 ML Misc TWICE DAILY   UltiCare Insulin Syringe 31G X 5/16" 0.3 ML Misc Generic drug: Insulin Syringe-Needle U-100   levothyroxine 75 MCG tablet Commonly known as: SYNTHROID Take 75 mcg by mouth daily before breakfast.   lisinopril 40 MG tablet Commonly known as: ZESTRIL Take 40 mg by mouth daily.   metoprolol succinate 100 MG 24 hr tablet Commonly known as: TOPROL-XL Take by mouth.   metoprolol tartrate 100 MG tablet Commonly known as: LOPRESSOR Take 100 mg by mouth daily.   omeprazole 20 MG capsule Commonly known as: PRILOSEC Take 20 mg by mouth daily.   sildenafil 20 MG tablet Commonly known as: REVATIO Take 3 to 5 tablets two hours before intercouse on an empty stomach.  Do not take with nitrates.   simvastatin 40 MG tablet Commonly known as: ZOCOR Take 40 mg by mouth daily.   vitamin B-12 1000 MCG tablet Commonly known as: CYANOCOBALAMIN Take by mouth.       Allergies: No Known Allergies  Family History: History reviewed. No pertinent family history.  Social History:  reports that he has never smoked. He has never used smokeless tobacco. He reports current alcohol use. No history on file for drug.  ROS: UROLOGY Frequent Urination?: No Hard to postpone urination?: No Burning/pain with urination?: No Get up at night to urinate?: No Leakage of urine?: No Urine stream starts and stops?: No Trouble starting  stream?: No Do you have to strain to urinate?: No Blood in urine?: No Urinary tract infection?: No Sexually transmitted disease?: No Injury to kidneys or bladder?: No Painful intercourse?: No Weak stream?: No Erection problems?: No Penile pain?: No  Gastrointestinal Nausea?: No Vomiting?: No Indigestion/heartburn?: No  Diarrhea?: No Constipation?: No  Constitutional Fever: No Night sweats?: No Weight loss?: No Fatigue?: No  Skin Skin rash/lesions?: No Itching?: No  Eyes Blurred vision?: No Double vision?: No  Ears/Nose/Throat Sore throat?: No Sinus problems?: No  Hematologic/Lymphatic Swollen glands?: No Easy bruising?: No  Cardiovascular Leg swelling?: No Chest pain?: No  Respiratory Cough?: No Shortness of breath?: No  Endocrine Excessive thirst?: No  Musculoskeletal Back pain?: No Joint pain?: No  Neurological Headaches?: No Dizziness?: No  Psychologic Depression?: No Anxiety?: No  Physical Exam: BP (!) 206/80 (BP Location: Left Arm, Patient Position: Sitting, Cuff Size: Normal)   Pulse (!) 58   Ht 5\' 6"  (1.676 m)   Wt 182 lb (82.6 kg)   BMI 29.38 kg/m   Constitutional:  Well nourished. Alert and oriented, No acute distress. HEENT: [place] AT, moist mucus membranes.  Trachea midline, no masses. Cardiovascular: No clubbing, cyanosis, or edema. Respiratory: Normal respiratory effort, no increased work of breathing. GI: Abdomen is soft, non tender, non distended, no abdominal masses. Liver and spleen not palpable.  No hernias appreciated.  Stool sample for occult testing is not indicated.   GU: No CVA tenderness.  No bladder fullness or masses.  Patient with uncircumcised phallus.  Foreskin easily retracted  Urethral meatus is patent.  No penile discharge. No penile lesions or rashes. Scrotum without lesions, cysts, rashes and/or edema.  Testicles are located scrotally bilaterally. No masses are appreciated in the testicles. Left and right  epididymis are normal. Rectal: Patient with  normal sphincter tone. Anus and perineum without scarring or rashes. No rectal masses are appreciated. Prostate is approximately 50 grams, could only palpate the apex and midportion, 1 cm  Nodule in the right lobe.  Seminal vesicles could not be palpated.  Skin: No rashes, bruises or suspicious lesions. Lymph: No cervical or inguinal adenopathy. Neurologic: Grossly intact, no focal deficits, moving all 4 extremities. Psychiatric: Normal mood and affect.   Laboratory Results  Prostate Specific Antigen, Serum Ref: 0.1 - 4 ng/mL  02/07/2013 08:53 AM 2.6  09/04/2013 12:17 PM 2.5  04/02/2014 02:08 PM 2.8  04/10/2015 11:36 AM 2.68  05/26/2016 9:36 AM 2.17  08/03/2017 10:59 AM 2.12  04/10/2018 09:57 AM 2.7  11/09/2018 09:55 AM 2.9  I have reviewed the labs.  Assessment & Plan:    1. BPH with LUTS IPSS score is 2/2, it is improving Continue conservative management, avoiding bladder irritants and timed voiding's Continue finasteride 5 mg daily RTC in 12 months for IPSS, PSA and exam   2. Prostate Nodule  - Palpated on DRE, remains stable  - PSA is pending   3. Erectile dysfunction SHIM score is 17 Continue sildenafil 20 mg, refills given  RTC in 12 months for repeat SHIM score and exam    Return in about 1 year (around 05/14/2020) for IPSS, SHIM, PSA and exam.  Zara Council, [facility] [address], [place], [place] [address] [phone]

## 2019-05-15 ENCOUNTER — Encounter: Payer: Self-pay | Admitting: Urology

## 2019-05-15 ENCOUNTER — Ambulatory Visit: Payer: PPO | Admitting: Urology

## 2019-05-15 ENCOUNTER — Other Ambulatory Visit: Payer: Self-pay

## 2019-05-15 VITALS — BP 206/80 | HR 58 | Ht 66.0 in | Wt 182.0 lb

## 2019-05-15 DIAGNOSIS — N138 Other obstructive and reflux uropathy: Secondary | ICD-10-CM

## 2019-05-15 DIAGNOSIS — N401 Enlarged prostate with lower urinary tract symptoms: Secondary | ICD-10-CM | POA: Diagnosis not present

## 2019-05-15 DIAGNOSIS — N529 Male erectile dysfunction, unspecified: Secondary | ICD-10-CM

## 2019-05-15 DIAGNOSIS — N402 Nodular prostate without lower urinary tract symptoms: Secondary | ICD-10-CM

## 2019-05-15 MED ORDER — SILDENAFIL CITRATE 20 MG PO TABS: ORAL_TABLET | ORAL | 3 refills | Status: DC

## 2019-05-16 ENCOUNTER — Telehealth: Payer: Self-pay | Admitting: *Deleted

## 2019-05-16 LAB — PSA: Prostate Specific Ag, Serum: 2.7 ng/mL (ref 0.0–4.0)

## 2019-05-16 NOTE — Telephone Encounter (Addendum)
Called 2x, phone call was answered but unable to reach patient-asked to return call.  ----- Message from Nori Riis, PA-C sent at 05/16/2019  7:58 AM EDT ----- Please let Mr. Bondy know that his PSA is stable at 2.7 and we will see him next year.

## 2019-05-26 DIAGNOSIS — Z1159 Encounter for screening for other viral diseases: Secondary | ICD-10-CM | POA: Diagnosis not present

## 2019-06-03 ENCOUNTER — Encounter (INDEPENDENT_AMBULATORY_CARE_PROVIDER_SITE_OTHER): Payer: PPO | Admitting: Vascular Surgery

## 2019-06-20 ENCOUNTER — Ambulatory Visit (INDEPENDENT_AMBULATORY_CARE_PROVIDER_SITE_OTHER): Payer: PPO | Admitting: Nurse Practitioner

## 2019-06-20 ENCOUNTER — Other Ambulatory Visit: Payer: Self-pay

## 2019-06-20 ENCOUNTER — Encounter (INDEPENDENT_AMBULATORY_CARE_PROVIDER_SITE_OTHER): Payer: Self-pay

## 2019-06-20 ENCOUNTER — Encounter (INDEPENDENT_AMBULATORY_CARE_PROVIDER_SITE_OTHER): Payer: Self-pay | Admitting: Nurse Practitioner

## 2019-06-20 VITALS — BP 174/92 | HR 76 | Resp 14 | Ht 66.0 in | Wt 182.0 lb

## 2019-06-20 DIAGNOSIS — Z794 Long term (current) use of insulin: Secondary | ICD-10-CM

## 2019-06-20 DIAGNOSIS — E1122 Type 2 diabetes mellitus with diabetic chronic kidney disease: Secondary | ICD-10-CM

## 2019-06-20 DIAGNOSIS — R6889 Other general symptoms and signs: Secondary | ICD-10-CM

## 2019-06-20 DIAGNOSIS — N183 Chronic kidney disease, stage 3 unspecified: Secondary | ICD-10-CM

## 2019-06-20 DIAGNOSIS — E781 Pure hyperglyceridemia: Secondary | ICD-10-CM | POA: Diagnosis not present

## 2019-06-20 NOTE — Progress Notes (Signed)
SUBJECTIVE:  Patient ID: Glen Martinez, male    DOB: Feb 28, 1931, 85 y.o.   MRN: [id number] Chief Complaint  Patient presents with  . Follow-up    HPI  Glen Martinez is a 85 y.o. male that presents to the office after being referred by Dr. Ginette Pitman, due to abnormal arterial studies done by the patient's insurance company.  Based upon the visit notes the right was 0.66 and left was 0.46.  However, the patient denies any claudication-like symptoms.  He states that he recently was able to walk for about half an hour on the treadmill without any issues.  The late lower extremity issues that he does endorse is some burning in his bilateral feet which only happens off and on.  And it mainly is associated at night when he puts the covers on his feet.  The patient denies smoking.  He does have also have a history of diabetes as well as gout.  His wife is a patient here with peripheral artery disease and frequently has claudication-like symptoms.  She states that he never describe any symptoms like that.  He denies any fever, chills, nausea, vomiting or diarrhea.  He denies any chest pain or shortness of breath.  He denies any TIA-like symptoms or amaurosis fugax.  Past Medical History:  Diagnosis Date  . Diabetes mellitus without complication ([place])   . GERD (gastroesophageal reflux disease)   . Gout   . Hyperlipidemia   . Hypertension   . Hypothyroidism   . Wears dentures    partial upper and lower    Past Surgical History:  Procedure Laterality Date  . CATARACT EXTRACTION W/PHACO Left 05/02/2018   Procedure: CATARACT EXTRACTION PHACO AND INTRAOCULAR LENS PLACEMENT ([place]) DIABETIC;  Surgeon: Leandrew Koyanagi, MD;  Location: [facility];  Service: Ophthalmology;  Laterality: Left;  diabetic - insulin  . COLONOSCOPY    . HERNIA REPAIR    . TONSILLECTOMY      Social History   Socioeconomic History  . Marital status: Married    Spouse name: Not on file  . Number of children:  Not on file  . Years of education: Not on file  . Highest education level: Not on file  Occupational History  . Not on file  Social Needs  . Financial resource strain: Not on file  . Food insecurity    Worry: Not on file    Inability: Not on file  . Transportation needs    Medical: Not on file    Non-medical: Not on file  Tobacco Use  . Smoking status: Never Smoker  . Smokeless tobacco: Never Used  Substance and Sexual Activity  . Alcohol use: Yes    Comment: Holidays  . Drug use: Not on file  . Sexual activity: Not on file  Lifestyle  . Physical activity    Days per week: Not on file    Minutes per session: Not on file  . Stress: Not on file  Relationships  . Social Herbalist on phone: Not on file    Gets together: Not on file    Attends religious service: Not on file    Active member of club or organization: Not on file    Attends meetings of clubs or organizations: Not on file    Relationship status: Not on file  . Intimate partner violence    Fear of current or ex partner: Not on file    Emotionally abused: Not on file  Physically abused: Not on file    Forced sexual activity: Not on file  Other Topics Concern  . Not on file  Social History Narrative  . Not on file    History reviewed. No pertinent family history.  No Known Allergies   Review of Systems   Review of Systems: Negative Unless Checked Constitutional: [] Weight loss  [] Fever  [] Chills Cardiac: [] Chest pain   []  Atrial Fibrillation  [] Palpitations   [] Shortness of breath when laying flat   [] Shortness of breath with exertion. [] Shortness of breath at rest Vascular:  [] Pain in legs with walking   [] Pain in legs with standing [] Pain in legs when laying flat   [] Claudication    [] Pain in feet when laying flat    [] History of DVT   [] Phlebitis   [] Swelling in legs   [] Varicose veins   [] Non-healing ulcers Pulmonary:   [] Uses home oxygen   [] Productive cough   [] Hemoptysis   [] Wheeze   [] COPD   [] Asthma Neurologic:  [] Dizziness   [] Seizures  [] Blackouts [] History of stroke   [] History of TIA  [] Aphasia   [] Temporary Blindness   [] Weakness or numbness in arm   [] Weakness or numbness in leg Musculoskeletal:   [x] Joint swelling   [x] Joint pain   [] Low back pain  []  History of Knee Replacement [x] Arthritis [] back Surgeries  []  Spinal Stenosis    Hematologic:  [] Easy bruising  [] Easy bleeding   [] Hypercoagulable state   [] Anemic Gastrointestinal:  [] Diarrhea   [] Vomiting  [x] Gastroesophageal reflux/heartburn   [] Difficulty swallowing. [] Abdominal pain Genitourinary:  [] Chronic kidney disease   [] Difficult urination  [] Anuric   [] Blood in urine [] Frequent urination  [] Burning with urination   [] Hematuria Skin:  [] Rashes   [] Ulcers [] Wounds Psychological:  [] History of anxiety   []  History of major depression  []  Memory Difficulties      OBJECTIVE:   Physical Exam  BP (!) 174/92 (BP Location: Left Arm, Patient Position: Sitting, Cuff Size: Normal)   Pulse 76   Resp 14   Ht 5\' 6"  (1.676 m)   Wt 182 lb (82.6 kg)   BMI 29.38 kg/m   Gen: WD/WN, NAD, appears younger than stated age Head: [place]/AT, No temporalis wasting.  Ear/Nose/Throat: Hearing grossly intact, nares w/o erythema or drainage Eyes: PER, EOMI, sclera nonicteric.  Neck: Supple, no masses.  No JVD.  Pulmonary:  Good air movement, no use of accessory muscles.  Cardiac: RRR Vascular:  Vessel Right Left  Radial Palpable Palpable  Dorsalis Pedis Palpable Palpable  Posterior Tibial Palpable Palpable   Gastrointestinal: soft, non-distended. No guarding/no peritoneal signs.  Musculoskeletal: M/S 5/5 throughout.  No deformity or atrophy.  Neurologic: Pain and light touch intact in extremities.  Symmetrical.  Speech is fluent. Motor exam as listed above. Psychiatric: Judgment intact, Mood & affect appropriate for pt's clinical situation. Dermatologic: No Venous rashes. No Ulcers Noted.  No changes consistent with  cellulitis. Lymph : No Cervical lymphadenopathy, no lichenification or skin changes of chronic lymphedema.       ASSESSMENT AND PLAN:  1. Abnormal ankle brachial index (ABI) The patient had a normal screening from his insurance company, therefore we will perform an ABI in order to validate the results.  I discussed with the patient that sometimes the screening tests that are done by the insurance company do not always correlate.  Also, that in some rare instances some patients do have peripheral artery disease with very little symptoms.  Therefore it would be prudent in order to  assess his ABIs so that we are aware of his arterial status going forward.  Patient will follow-up with an ABI test at his convenience. - VAS [place] ABI WITH/WO TBI; Future  2. Pure hypertriglyceridemia Continue statin as ordered and reviewed, no changes at this time   3. Type 2 diabetes mellitus with stage 3 chronic kidney disease, with long-term current use of insulin (HCC) Continue hypoglycemic medications as already ordered, these medications have been reviewed and there are no changes at this time.  Hgb A1C to be monitored as already arranged by primary service    Current Outpatient Medications on File Prior to Visit  Medication Sig Dispense Refill  . allopurinol (ZYLOPRIM) 100 MG tablet Take 100 mg by mouth daily.    Marland Kitchen aspirin 81 MG tablet Take 81 mg by mouth daily.    . finasteride (PROSCAR) 5 MG tablet Take 5 mg by mouth daily.    Marland Kitchen gabapentin (NEURONTIN) 100 MG capsule Take 100 mg by mouth daily.    . insulin NPH-regular Human (NOVOLIN 70/30) (70-30) 100 UNIT/ML injection Inject into the skin. 28 units at breakfast. 8 units at bedtime.    Marland Kitchen levothyroxine (SYNTHROID, LEVOTHROID) 75 MCG tablet Take 75 mcg by mouth daily before breakfast.    . lisinopril (PRINIVIL,ZESTRIL) 40 MG tablet Take 40 mg by mouth daily.    Marland Kitchen omeprazole (PRILOSEC) 20 MG capsule Take 20 mg by mouth daily.    . simvastatin (ZOCOR) 40  MG tablet Take 40 mg by mouth daily.    . metoprolol succinate (TOPROL-XL) 100 MG 24 hr tablet Take by mouth.     No current facility-administered medications on file prior to visit.     There are no Patient Instructions on file for this visit. No follow-ups on file.   Kris Hartmann, NP  This note was completed with Sales executive.  Any errors are purely unintentional.

## 2019-06-26 ENCOUNTER — Encounter (INDEPENDENT_AMBULATORY_CARE_PROVIDER_SITE_OTHER): Payer: Self-pay | Admitting: Nurse Practitioner

## 2019-06-26 ENCOUNTER — Other Ambulatory Visit: Payer: Self-pay

## 2019-06-26 ENCOUNTER — Ambulatory Visit (INDEPENDENT_AMBULATORY_CARE_PROVIDER_SITE_OTHER): Payer: PPO

## 2019-06-26 ENCOUNTER — Ambulatory Visit (INDEPENDENT_AMBULATORY_CARE_PROVIDER_SITE_OTHER): Payer: PPO | Admitting: Nurse Practitioner

## 2019-06-26 ENCOUNTER — Encounter (INDEPENDENT_AMBULATORY_CARE_PROVIDER_SITE_OTHER): Payer: Self-pay

## 2019-06-26 VITALS — BP 195/76 | HR 61 | Resp 12 | Ht 66.0 in | Wt 181.0 lb

## 2019-06-26 DIAGNOSIS — E781 Pure hyperglyceridemia: Secondary | ICD-10-CM | POA: Diagnosis not present

## 2019-06-26 DIAGNOSIS — R6889 Other general symptoms and signs: Secondary | ICD-10-CM

## 2019-06-26 DIAGNOSIS — I10 Essential (primary) hypertension: Secondary | ICD-10-CM

## 2019-07-03 ENCOUNTER — Encounter (INDEPENDENT_AMBULATORY_CARE_PROVIDER_SITE_OTHER): Payer: Self-pay | Admitting: Nurse Practitioner

## 2019-07-03 DIAGNOSIS — R6889 Other general symptoms and signs: Secondary | ICD-10-CM | POA: Insufficient documentation

## 2019-07-03 NOTE — Progress Notes (Signed)
Glen Martinez, Glen Martinez    DOB: [birth year], 85 y.o.   MRN: [id number] Chief Complaint  Patient presents with  . Follow-up    Glen Martinez  Glen Martinez is a 85 y.o. Glen Martinez that presents to the office after being referred by Dr. Ginette Pitman, due to abnormal arterial studies done by the patient's insurance company.  Based upon the visit notes the right was 0.66 and left was 0.46.  However, the patient denies any claudication-like symptoms.  He states that he recently was able to walk for about half an hour on the treadmill without any issues.  The late lower extremity issues that he does endorse is some burning in his bilateral feet which only happens off and on.  And it mainly is associated at night when he puts the covers on his feet.  The patient denies smoking.  He does have also have a history of diabetes as well as gout.  His wife is a patient here with peripheral artery disease and frequently has claudication-like symptoms.  She states that he never describe any symptoms like that.  He denies any fever, chills, nausea, vomiting or diarrhea.  He denies any chest pain or shortness of breath.  He denies any TIA-like symptoms or amaurosis fugax.    Today the patient has a right ABI of 1.17 with a left of 1.17.  The right TBI is 1.04 with a left of 0.90.  The patient has triphasic tibial artery waveforms in the left lower extremity.  The right lower extremity has triphasic waveforms in the anterior tibial artery with biphasic in the posterior tibial artery.  The patient has strong toe waveforms bilaterally.   Past Medical History:  Diagnosis Date  . Diabetes mellitus without complication ([place])   . GERD (gastroesophageal reflux disease)   . Gout   . Hyperlipidemia   . Hypertension   . Hypothyroidism   . Wears dentures    partial upper and lower    Past Surgical History:  Procedure Laterality Date  . CATARACT EXTRACTION W/PHACO Left 05/02/2018   Procedure: CATARACT EXTRACTION  PHACO AND INTRAOCULAR LENS PLACEMENT ([place]) DIABETIC;  Surgeon: Leandrew Koyanagi, MD;  Location: [place];  Service: Ophthalmology;  Laterality: Left;  diabetic - insulin  . COLONOSCOPY    . HERNIA REPAIR    . TONSILLECTOMY      Social History   Socioeconomic History  . Marital status: Married    Spouse name: Not on file  . Number of children: Not on file  . Years of education: Not on file  . Highest education level: Not on file  Occupational History  . Not on file  Social Needs  . Financial resource strain: Not on file  . Food insecurity    Worry: Not on file    Inability: Not on file  . Transportation needs    Medical: Not on file    Non-medical: Not on file  Tobacco Use  . Smoking status: Never Smoker  . Smokeless tobacco: Never Used  Substance and Sexual Activity  . Alcohol use: Yes    Comment: Holidays  . Drug use: Not on file  . Sexual activity: Not on file  Lifestyle  . Physical activity    Days per week: Not on file    Minutes per session: Not on file  . Stress: Not on file  Relationships  . Social Herbalist on phone: Not on file    Gets  together: Not on file    Attends religious service: Not on file    Active member of club or organization: Not on file    Attends meetings of clubs or organizations: Not on file    Relationship status: Not on file  . Intimate partner violence    Fear of current or ex partner: Not on file    Emotionally abused: Not on file    Physically abused: Not on file    Forced sexual activity: Not on file  Other Topics Concern  . Not on file  Social History Narrative  . Not on file    History reviewed. No pertinent family history.  No Known Allergies   Review of Systems   Review of Systems: Negative Unless Checked Constitutional: [] Weight loss  [] Fever  [] Chills Cardiac: [] Chest pain   []  Atrial Fibrillation  [] Palpitations   [] Shortness of breath when laying flat   [] Shortness of breath with  exertion. [] Shortness of breath at rest Vascular:  [] Pain in legs with walking   [] Pain in legs with standing [] Pain in legs when laying flat   [] Claudication    [] Pain in feet when laying flat    [] History of DVT   [] Phlebitis   [] Swelling in legs   [] Varicose veins   [] Non-healing ulcers Pulmonary:   [] Uses home oxygen   [] Productive cough   [] Hemoptysis   [] Wheeze  [] COPD   [] Asthma Neurologic:  [] Dizziness   [] Seizures  [] Blackouts [] History of stroke   [] History of TIA  [] Aphasia   [] Temporary Blindness   [] Weakness or numbness in arm   [] Weakness or numbness in leg Musculoskeletal:   [] Joint swelling   [] Joint pain   [] Low back pain  []  History of Knee Replacement [x] Arthritis [] back Surgeries  []  Spinal Stenosis    Hematologic:  [] Easy bruising  [] Easy bleeding   [] Hypercoagulable state   [] Anemic Gastrointestinal:  [] Diarrhea   [] Vomiting  [x] Gastroesophageal reflux/heartburn   [] Difficulty swallowing. [] Abdominal pain Genitourinary:  [] Chronic kidney disease   [] Difficult urination  [] Anuric   [] Blood in urine [] Frequent urination  [] Burning with urination   [] Hematuria Skin:  [] Rashes   [] Ulcers [] Wounds Psychological:  [] History of anxiety   []  History of major depression  []  Memory Difficulties      OBJECTIVE:   Physical Exam  BP (!) 195/76 (BP Location: Left Arm, Patient Position: Sitting, Cuff Size: Large)   Pulse 61   Resp 12   Ht 5\' 6"  (1.676 m)   Wt 181 lb (82.1 kg)   BMI 29.21 kg/m   Gen: WD/WN, NAD Head: [place]/AT, No temporalis wasting.  Ear/Nose/Throat: Hearing grossly intact, nares w/o erythema or drainage Eyes: PER, EOMI, sclera nonicteric.  Neck: Supple, no masses.  No JVD.  Pulmonary:  Good air movement, no use of accessory muscles.  Cardiac: RRR Vascular:  Vessel Right Left  Radial Palpable Palpable  Dorsalis Pedis Palpable Palpable  Posterior Tibial Palpable Palpable   Gastrointestinal: soft, non-distended. No guarding/no peritoneal signs.   Musculoskeletal: M/S 5/5 throughout.  No deformity or atrophy.  Neurologic: Pain and light touch intact in extremities.  Symmetrical.  Speech is fluent. Motor exam as listed above. Psychiatric: Judgment intact, Mood & affect appropriate for pt's clinical situation. Dermatologic: No Venous rashes. No Ulcers Noted.  No changes consistent with cellulitis. Lymph : No Cervical lymphadenopathy, no lichenification or skin changes of chronic lymphedema.       ASSESSMENT AND PLAN:  1. Abnormal ankle brachial index (ABI) Based on noninvasive  studies today the patient has no evidence of peripheral artery disease.  The patient is advised to contact our office if he begins to have claudication-like symptoms, rest pain or ulcerations.  Otherwise the patient will follow-up on an as-needed basis.  2. Essential hypertension Continue antihypertensive medications as already ordered, these medications have been reviewed and there are no changes at this time.   3. Pure hypertriglyceridemia Continue statin as ordered and reviewed, no changes at this time    Current Outpatient Medications on File Prior to Visit  Medication Sig Dispense Refill  . allopurinol (ZYLOPRIM) 100 MG tablet Take 100 mg by mouth daily.    Glen Kitchen aspirin 81 MG tablet Take 81 mg by mouth daily.    . finasteride (PROSCAR) 5 MG tablet Take 5 mg by mouth daily.    Glen Kitchen gabapentin (NEURONTIN) 100 MG capsule Take 100 mg by mouth daily.    . hydrochlorothiazide (HYDRODIURIL) 25 MG tablet Take 25 mg by mouth daily.    . insulin NPH-regular Human (NOVOLIN 70/30) (70-30) 100 UNIT/ML injection Inject into the skin. 28 units at breakfast. 8 units at bedtime.    Glen Kitchen levothyroxine (SYNTHROID, LEVOTHROID) 75 MCG tablet Take 75 mcg by mouth daily before breakfast.    . lisinopril (PRINIVIL,ZESTRIL) 40 MG tablet Take 40 mg by mouth daily.    . metoprolol succinate (TOPROL-XL) 100 MG 24 hr tablet Take by mouth.    Glen Kitchen omeprazole (PRILOSEC) 20 MG capsule Take 20  mg by mouth daily.    . simvastatin (ZOCOR) 40 MG tablet Take 40 mg by mouth daily.     No current facility-administered medications on file prior to visit.     There are no Patient Instructions on file for this visit. No follow-ups on file.   Kris Hartmann, NP  This note was completed with Sales executive.  Any errors are purely unintentional.

## 2019-07-19 DIAGNOSIS — Z794 Long term (current) use of insulin: Secondary | ICD-10-CM | POA: Diagnosis not present

## 2019-07-19 DIAGNOSIS — Z8739 Personal history of other diseases of the musculoskeletal system and connective tissue: Secondary | ICD-10-CM | POA: Diagnosis not present

## 2019-07-19 DIAGNOSIS — E7800 Pure hypercholesterolemia, unspecified: Secondary | ICD-10-CM | POA: Diagnosis not present

## 2019-07-19 DIAGNOSIS — E538 Deficiency of other specified B group vitamins: Secondary | ICD-10-CM | POA: Diagnosis not present

## 2019-07-19 DIAGNOSIS — E1122 Type 2 diabetes mellitus with diabetic chronic kidney disease: Secondary | ICD-10-CM | POA: Diagnosis not present

## 2019-07-19 DIAGNOSIS — N183 Chronic kidney disease, stage 3 unspecified: Secondary | ICD-10-CM | POA: Diagnosis not present

## 2019-07-19 DIAGNOSIS — Z0000 Encounter for general adult medical examination without abnormal findings: Secondary | ICD-10-CM | POA: Diagnosis not present

## 2019-07-19 DIAGNOSIS — E039 Hypothyroidism, unspecified: Secondary | ICD-10-CM | POA: Diagnosis not present

## 2019-07-24 DIAGNOSIS — E1140 Type 2 diabetes mellitus with diabetic neuropathy, unspecified: Secondary | ICD-10-CM | POA: Diagnosis not present

## 2019-07-24 DIAGNOSIS — I10 Essential (primary) hypertension: Secondary | ICD-10-CM | POA: Diagnosis not present

## 2019-07-24 DIAGNOSIS — N183 Chronic kidney disease, stage 3 unspecified: Secondary | ICD-10-CM | POA: Diagnosis not present

## 2019-07-24 DIAGNOSIS — R7989 Other specified abnormal findings of blood chemistry: Secondary | ICD-10-CM | POA: Diagnosis not present

## 2019-07-24 DIAGNOSIS — N1831 Chronic kidney disease, stage 3a: Secondary | ICD-10-CM | POA: Diagnosis not present

## 2019-07-24 DIAGNOSIS — E1122 Type 2 diabetes mellitus with diabetic chronic kidney disease: Secondary | ICD-10-CM | POA: Diagnosis not present

## 2019-07-24 DIAGNOSIS — Z8739 Personal history of other diseases of the musculoskeletal system and connective tissue: Secondary | ICD-10-CM | POA: Diagnosis not present

## 2019-07-24 DIAGNOSIS — E039 Hypothyroidism, unspecified: Secondary | ICD-10-CM | POA: Diagnosis not present

## 2019-07-24 DIAGNOSIS — E1121 Type 2 diabetes mellitus with diabetic nephropathy: Secondary | ICD-10-CM | POA: Diagnosis not present

## 2019-07-24 DIAGNOSIS — E7800 Pure hypercholesterolemia, unspecified: Secondary | ICD-10-CM | POA: Diagnosis not present

## 2019-07-24 DIAGNOSIS — Z794 Long term (current) use of insulin: Secondary | ICD-10-CM | POA: Diagnosis not present

## 2019-11-25 DIAGNOSIS — I10 Essential (primary) hypertension: Secondary | ICD-10-CM | POA: Diagnosis not present

## 2019-11-25 DIAGNOSIS — N1831 Chronic kidney disease, stage 3a: Secondary | ICD-10-CM | POA: Diagnosis not present

## 2019-11-25 DIAGNOSIS — Z8739 Personal history of other diseases of the musculoskeletal system and connective tissue: Secondary | ICD-10-CM | POA: Diagnosis not present

## 2019-11-25 DIAGNOSIS — E1121 Type 2 diabetes mellitus with diabetic nephropathy: Secondary | ICD-10-CM | POA: Diagnosis not present

## 2019-11-25 DIAGNOSIS — E1140 Type 2 diabetes mellitus with diabetic neuropathy, unspecified: Secondary | ICD-10-CM | POA: Diagnosis not present

## 2019-11-25 DIAGNOSIS — Z794 Long term (current) use of insulin: Secondary | ICD-10-CM | POA: Diagnosis not present

## 2019-11-25 DIAGNOSIS — E039 Hypothyroidism, unspecified: Secondary | ICD-10-CM | POA: Diagnosis not present

## 2019-11-25 DIAGNOSIS — E7800 Pure hypercholesterolemia, unspecified: Secondary | ICD-10-CM | POA: Diagnosis not present

## 2019-12-24 DIAGNOSIS — N1831 Chronic kidney disease, stage 3a: Secondary | ICD-10-CM | POA: Diagnosis not present

## 2019-12-24 DIAGNOSIS — D649 Anemia, unspecified: Secondary | ICD-10-CM | POA: Diagnosis not present

## 2019-12-24 DIAGNOSIS — N400 Enlarged prostate without lower urinary tract symptoms: Secondary | ICD-10-CM | POA: Diagnosis not present

## 2019-12-24 DIAGNOSIS — I10 Essential (primary) hypertension: Secondary | ICD-10-CM | POA: Diagnosis not present

## 2019-12-24 DIAGNOSIS — Z0000 Encounter for general adult medical examination without abnormal findings: Secondary | ICD-10-CM | POA: Diagnosis not present

## 2019-12-24 DIAGNOSIS — Z794 Long term (current) use of insulin: Secondary | ICD-10-CM | POA: Diagnosis not present

## 2019-12-24 DIAGNOSIS — E1121 Type 2 diabetes mellitus with diabetic nephropathy: Secondary | ICD-10-CM | POA: Diagnosis not present

## 2019-12-24 DIAGNOSIS — E1140 Type 2 diabetes mellitus with diabetic neuropathy, unspecified: Secondary | ICD-10-CM | POA: Diagnosis not present

## 2019-12-24 DIAGNOSIS — E538 Deficiency of other specified B group vitamins: Secondary | ICD-10-CM | POA: Diagnosis not present

## 2020-01-01 ENCOUNTER — Other Ambulatory Visit: Payer: Self-pay

## 2020-01-01 ENCOUNTER — Emergency Department: Payer: PPO

## 2020-01-01 ENCOUNTER — Encounter: Payer: Self-pay | Admitting: Emergency Medicine

## 2020-01-01 ENCOUNTER — Emergency Department
Admission: EM | Admit: 2020-01-01 | Discharge: 2020-01-01 | Disposition: A | Discharge status: Home / Self Care | Payer: PPO | Attending: Emergency Medicine | Admitting: Emergency Medicine

## 2020-01-01 DIAGNOSIS — R9431 Abnormal electrocardiogram [ECG] [EKG]: Secondary | ICD-10-CM | POA: Diagnosis not present

## 2020-01-01 DIAGNOSIS — Z7982 Long term (current) use of aspirin: Secondary | ICD-10-CM | POA: Insufficient documentation

## 2020-01-01 DIAGNOSIS — E1122 Type 2 diabetes mellitus with diabetic chronic kidney disease: Secondary | ICD-10-CM | POA: Diagnosis not present

## 2020-01-01 DIAGNOSIS — I129 Hypertensive chronic kidney disease with stage 1 through stage 4 chronic kidney disease, or unspecified chronic kidney disease: Secondary | ICD-10-CM | POA: Diagnosis not present

## 2020-01-01 DIAGNOSIS — Z79899 Other long term (current) drug therapy: Secondary | ICD-10-CM | POA: Diagnosis not present

## 2020-01-01 DIAGNOSIS — E039 Hypothyroidism, unspecified: Secondary | ICD-10-CM | POA: Insufficient documentation

## 2020-01-01 DIAGNOSIS — R079 Chest pain, unspecified: Secondary | ICD-10-CM | POA: Diagnosis not present

## 2020-01-01 DIAGNOSIS — N183 Chronic kidney disease, stage 3 unspecified: Secondary | ICD-10-CM | POA: Diagnosis not present

## 2020-01-01 DIAGNOSIS — R0789 Other chest pain: Secondary | ICD-10-CM | POA: Insufficient documentation

## 2020-01-01 DIAGNOSIS — I10 Essential (primary) hypertension: Secondary | ICD-10-CM | POA: Diagnosis not present

## 2020-01-01 DIAGNOSIS — Z794 Long term (current) use of insulin: Secondary | ICD-10-CM | POA: Insufficient documentation

## 2020-01-01 DIAGNOSIS — E1121 Type 2 diabetes mellitus with diabetic nephropathy: Secondary | ICD-10-CM | POA: Diagnosis not present

## 2020-01-01 DIAGNOSIS — N1831 Chronic kidney disease, stage 3a: Secondary | ICD-10-CM | POA: Diagnosis not present

## 2020-01-01 DIAGNOSIS — R1010 Upper abdominal pain, unspecified: Secondary | ICD-10-CM | POA: Diagnosis not present

## 2020-01-01 LAB — CBC
HCT: 43.5 % (ref 39.0–52.0)
Hemoglobin: 14.2 g/dL (ref 13.0–17.0)
MCH: 30.4 pg (ref 26.0–34.0)
MCHC: 32.6 g/dL (ref 30.0–36.0)
MCV: 93.1 fL (ref 80.0–100.0)
Platelets: 146 10*3/uL — ABNORMAL LOW (ref 150–400)
RBC: 4.67 MIL/uL (ref 4.22–5.81)
RDW: 13.5 % (ref 11.5–15.5)
WBC: 8.8 10*3/uL (ref 4.0–10.5)
nRBC: 0 % (ref 0.0–0.2)

## 2020-01-01 LAB — BASIC METABOLIC PANEL
Anion gap: 7 (ref 5–15)
BUN: 15 mg/dL (ref 8–23)
CO2: 25 mmol/L (ref 22–32)
Calcium: 8.9 mg/dL (ref 8.9–10.3)
Chloride: 106 mmol/L (ref 98–111)
Creatinine, Ser: 1.21 mg/dL (ref 0.61–1.24)
GFR calc Af Amer: >60 mL/min (ref >60)
GFR calc non Af Amer: 54 mL/min — ABNORMAL LOW (ref >60)
Glucose, Bld: 181 mg/dL — ABNORMAL HIGH (ref 70–99)
Potassium: 3.7 mmol/L (ref 3.5–5.1)
Sodium: 138 mmol/L (ref 135–145)

## 2020-01-01 LAB — HEPATIC FUNCTION PANEL
ALT: 15 U/L (ref 0–44)
AST: 19 U/L (ref 15–41)
Albumin: 4.1 g/dL (ref 3.5–5.0)
Alkaline Phosphatase: 75 U/L (ref 38–126)
Bilirubin, Direct: <0.1 mg/dL (ref 0.0–0.2)
Total Bilirubin: 0.5 mg/dL (ref 0.3–1.2)
Total Protein: 7.3 g/dL (ref 6.5–8.1)

## 2020-01-01 LAB — TROPONIN I (HIGH SENSITIVITY)
Troponin I (High Sensitivity): 9 ng/L (ref <18)
Troponin I (High Sensitivity): 10 ng/L (ref <18)

## 2020-01-01 LAB — LIPASE, BLOOD: Lipase: 23 U/L (ref 11–51)

## 2020-01-01 MED ORDER — FAMOTIDINE 20 MG PO TABS: 20.0000 mg | ORAL_TABLET | Freq: Every day | ORAL | 1 refills | Status: AC

## 2020-01-01 NOTE — ED Triage Notes (Signed)
Pt reports CP that started Saturday and continues intermittently. Pt reports pain radiates from back to his chest and around his ribs. Pt denies SOB or nausea.

## 2020-01-01 NOTE — Discharge Instructions (Addendum)
Please seek medical attention for any high fevers, chest pain, shortness of breath, change in behavior, persistent vomiting, bloody stool or any other new or concerning symptoms.  

## 2020-01-01 NOTE — ED Provider Notes (Signed)
[facility] Emergency Department Provider Note   ____________________________________________   I have reviewed the triage vital signs and the nursing notes.   HISTORY  Chief Complaint Chest Pain   History limited by: Not Limited   HPI Glen Martinez is a 86 y.o. male who presents to the emergency department today because of concern for chest pain. The patient states that he has had this pain on and off for the past 4 days. It initially started in his stomach and then he said it moved up the left side of his chest before moving down to his stomach again. The patient denies any pattern to the pain. Does not seem to be worse with exertion or eating. The patient has not had any shortness of breath. Has not had any diaphoresis. The patient denies any nausea or vomiting. Tried taking some pepto bismal without any significant change in symptoms.    Records reviewed. Per medical record review patient has a history of EKG performed at [facility] with sinus bradycardia, and read as possible inferior subendocardial injury, unfortunately actual image not able to be viewed.   Past Medical History:  Diagnosis Date  . Diabetes mellitus without complication ([place])   . GERD (gastroesophageal reflux disease)   . Gout   . Hyperlipidemia   . Hypertension   . Hypothyroidism   . Wears dentures    partial upper and lower    Patient Active Problem List   Diagnosis Date Noted  . Abnormal ankle brachial index (ABI) 07/03/2019  . Benign prostatic hyperplasia 04/09/2018  . Hyperlipidemia 04/09/2018  . Hypertension 04/09/2018  . Hypothyroidism 04/09/2018  . Personal history of gout 07/18/2016  . Type 2 diabetes mellitus with stage 3 chronic kidney disease, with long-term current use of insulin ([place]) 11/20/2015  . ED (erectile dysfunction) of organic origin 08/01/2012  . Elevated prostate specific antigen (PSA) 08/01/2012  . Nodular prostate with urinary obstruction  08/01/2012  . Incomplete emptying of bladder 08/01/2012  . Chronic prostatitis 08/01/2012  . Disorder of male genital organs 08/01/2012  . Urinary tract infection 08/01/2012    Past Surgical History:  Procedure Laterality Date  . CATARACT EXTRACTION W/PHACO Left 05/02/2018   Procedure: CATARACT EXTRACTION PHACO AND INTRAOCULAR LENS PLACEMENT ([place]) DIABETIC;  Surgeon: Leandrew Koyanagi, MD;  Location: [place];  Service: Ophthalmology;  Laterality: Left;  diabetic - insulin  . COLONOSCOPY    . HERNIA REPAIR    . TONSILLECTOMY      Prior to Admission medications   Medication Sig Start Date End Date Taking? Authorizing Provider  allopurinol (ZYLOPRIM) 100 MG tablet Take 100 mg by mouth daily.    [provider]  aspirin 81 MG tablet Take 81 mg by mouth daily.    [provider]  finasteride (PROSCAR) 5 MG tablet Take 5 mg by mouth daily.    [provider]  gabapentin (NEURONTIN) 100 MG capsule Take 100 mg by mouth daily.    [provider]  hydrochlorothiazide (HYDRODIURIL) 25 MG tablet Take 25 mg by mouth daily.    [provider]  insulin NPH-regular Human (NOVOLIN 70/30) (70-30) 100 UNIT/ML injection Inject into the skin. 28 units at breakfast. 8 units at bedtime.    [provider]  levothyroxine (SYNTHROID, LEVOTHROID) 75 MCG tablet Take 75 mcg by mouth daily before breakfast.    [provider]  lisinopril (PRINIVIL,ZESTRIL) 40 MG tablet Take 40 mg by mouth daily.    [provider]  metoprolol succinate (TOPROL-XL) 100 MG 24 hr tablet Take by mouth. 07/18/18 07/18/19  [provider]  omeprazole (PRILOSEC) 20 MG capsule Take 20 mg by mouth daily.    [provider]  simvastatin (ZOCOR) 40 MG tablet Take 40 mg by mouth daily.    [provider]    Allergies Patient has no known allergies.  No family history on file.  Social History Social History   Tobacco Use  .  Smoking status: Never Smoker  . Smokeless tobacco: Never Used  Substance Use Topics  . Alcohol use: Yes    Comment: Holidays  . Drug use: Not on file    Review of Systems Constitutional: No fever/chills Eyes: No visual changes. ENT: No sore throat. Cardiovascular: Positive for intermittent chest pain. Respiratory: Denies shortness of breath. Gastrointestinal: Positive for upper abdominal pain.  Genitourinary: Negative for dysuria. Musculoskeletal: Negative for back pain. Skin: Negative for rash. Neurological: Negative for headaches, focal weakness or numbness.  ____________________________________________   PHYSICAL EXAM:  VITAL SIGNS: ED Triage Vitals [01/01/20 1540]  Enc Vitals Group     BP (!) 192/72     Pulse Rate (!) 53     Resp 18     Temp 98.3 F (36.8 C)     Temp Source Oral     SpO2 98 %   Constitutional: Alert and oriented.  Eyes: Conjunctivae are normal.  ENT      Head: Normocephalic and atraumatic.      Nose: No congestion/rhinnorhea.      Mouth/Throat: Mucous membranes are moist.      Neck: No stridor. Hematological/Lymphatic/Immunilogical: No cervical lymphadenopathy. Cardiovascular: Normal rate, regular rhythm.  No murmurs, rubs, or gallops.  Respiratory: Normal respiratory effort without tachypnea nor retractions. Breath sounds are clear and equal bilaterally. No wheezes/rales/rhonchi. Gastrointestinal: Soft and non tender. No rebound. No guarding.  Genitourinary: Deferred Musculoskeletal: Normal range of motion in all extremities. No lower extremity edema. Neurologic:  Normal speech and language. No gross focal neurologic deficits are appreciated.  Skin:  Skin is warm, dry and intact. No rash noted. Psychiatric: Mood and affect are normal. Speech and behavior are normal. Patient exhibits appropriate insight and judgment.  ____________________________________________    LABS (pertinent positives/negatives)  BMP wnl except glu 181 CBC wbc 8.8,  hgb 14.2, plt 146 Trop hs 9 -> 10  ____________________________________________   EKG  I, Nance Pear, attending physician, personally viewed and interpreted this EKG  EKG Time: 1535 Rate: 54 Rhythm: sinus bradycardia Axis: normal Intervals: qtc 419 QRS: narrow ST changes: st depression II, III, avF, t wave inversion V3-v6 Impression: abnormal ekg  ____________________________________________    RADIOLOGY  CXR No acute abnormality  ____________________________________________   PROCEDURES  Procedures  ____________________________________________   INITIAL IMPRESSION / ASSESSMENT AND PLAN / ED COURSE  Pertinent labs & imaging results that were available during my care of the patient were reviewed by me and considered in my medical decision making (see chart for details).   Patient presented to the emergency department today because of concerns for intermittent chest pain that has been present for the past few days.  Differential would be broad including pneumonia, pneumothorax, PE, ACS, dissection amongst other etiologies.  Patient's work-up here without any concerning findings.  At this point I have lower suspicion for ACS.  However did discuss with patient would likely be a good idea for him to follow-up with cardiology.  Additionally will give patient prescription for antiacid in case his symptoms are secondary  to acid reflux. ____________________________________________   FINAL CLINICAL IMPRESSION(S) / ED DIAGNOSES  Final diagnoses:  Nonspecific chest pain     Note: This dictation was prepared with Dragon dictation. Any transcriptional errors that result from this process are unintentional     Nance Pear, MD 01/01/20 2017

## 2020-01-14 DIAGNOSIS — E1140 Type 2 diabetes mellitus with diabetic neuropathy, unspecified: Secondary | ICD-10-CM | POA: Diagnosis not present

## 2020-01-14 DIAGNOSIS — R079 Chest pain, unspecified: Secondary | ICD-10-CM | POA: Diagnosis not present

## 2020-01-14 DIAGNOSIS — E7800 Pure hypercholesterolemia, unspecified: Secondary | ICD-10-CM | POA: Diagnosis not present

## 2020-01-14 DIAGNOSIS — Z794 Long term (current) use of insulin: Secondary | ICD-10-CM | POA: Diagnosis not present

## 2020-01-14 DIAGNOSIS — I10 Essential (primary) hypertension: Secondary | ICD-10-CM | POA: Diagnosis not present

## 2020-01-20 DIAGNOSIS — M545 Low back pain: Secondary | ICD-10-CM | POA: Diagnosis not present

## 2020-01-20 DIAGNOSIS — M47816 Spondylosis without myelopathy or radiculopathy, lumbar region: Secondary | ICD-10-CM | POA: Diagnosis not present

## 2020-01-20 DIAGNOSIS — G8929 Other chronic pain: Secondary | ICD-10-CM | POA: Diagnosis not present

## 2020-03-26 DIAGNOSIS — E119 Type 2 diabetes mellitus without complications: Secondary | ICD-10-CM | POA: Diagnosis not present

## 2020-03-26 DIAGNOSIS — H43821 Vitreomacular adhesion, right eye: Secondary | ICD-10-CM | POA: Diagnosis not present

## 2020-03-31 DIAGNOSIS — M19071 Primary osteoarthritis, right ankle and foot: Secondary | ICD-10-CM | POA: Diagnosis not present

## 2020-03-31 DIAGNOSIS — M79604 Pain in right leg: Secondary | ICD-10-CM | POA: Diagnosis not present

## 2020-04-21 DIAGNOSIS — I10 Essential (primary) hypertension: Secondary | ICD-10-CM | POA: Diagnosis not present

## 2020-04-21 DIAGNOSIS — N1831 Chronic kidney disease, stage 3a: Secondary | ICD-10-CM | POA: Diagnosis not present

## 2020-04-21 DIAGNOSIS — E538 Deficiency of other specified B group vitamins: Secondary | ICD-10-CM | POA: Diagnosis not present

## 2020-04-21 DIAGNOSIS — E1122 Type 2 diabetes mellitus with diabetic chronic kidney disease: Secondary | ICD-10-CM | POA: Diagnosis not present

## 2020-04-21 DIAGNOSIS — D649 Anemia, unspecified: Secondary | ICD-10-CM | POA: Diagnosis not present

## 2020-04-21 DIAGNOSIS — E1140 Type 2 diabetes mellitus with diabetic neuropathy, unspecified: Secondary | ICD-10-CM | POA: Diagnosis not present

## 2020-04-21 DIAGNOSIS — N400 Enlarged prostate without lower urinary tract symptoms: Secondary | ICD-10-CM | POA: Diagnosis not present

## 2020-04-21 DIAGNOSIS — Z794 Long term (current) use of insulin: Secondary | ICD-10-CM | POA: Diagnosis not present

## 2020-04-28 DIAGNOSIS — Z0000 Encounter for general adult medical examination without abnormal findings: Secondary | ICD-10-CM | POA: Diagnosis not present

## 2020-04-28 DIAGNOSIS — E039 Hypothyroidism, unspecified: Secondary | ICD-10-CM | POA: Diagnosis not present

## 2020-04-28 DIAGNOSIS — E1140 Type 2 diabetes mellitus with diabetic neuropathy, unspecified: Secondary | ICD-10-CM | POA: Diagnosis not present

## 2020-04-28 DIAGNOSIS — Z794 Long term (current) use of insulin: Secondary | ICD-10-CM | POA: Diagnosis not present

## 2020-04-28 DIAGNOSIS — E1122 Type 2 diabetes mellitus with diabetic chronic kidney disease: Secondary | ICD-10-CM | POA: Diagnosis not present

## 2020-04-28 DIAGNOSIS — E7800 Pure hypercholesterolemia, unspecified: Secondary | ICD-10-CM | POA: Diagnosis not present

## 2020-04-28 DIAGNOSIS — N1831 Chronic kidney disease, stage 3a: Secondary | ICD-10-CM | POA: Diagnosis not present

## 2020-05-11 ENCOUNTER — Other Ambulatory Visit: Payer: Self-pay

## 2020-05-11 ENCOUNTER — Other Ambulatory Visit: Payer: PPO

## 2020-05-11 DIAGNOSIS — N138 Other obstructive and reflux uropathy: Secondary | ICD-10-CM

## 2020-05-13 NOTE — Progress Notes (Deleted)
05/14/2020  8:20 AM   Glen Martinez Feb 17, 1931 [id number]  Referring provider: Tracie Harrier, [facility] [place],  [place] [id number]  No chief complaint on file.   HPI: Glen Martinez is a 86 y.o.  male with BPH and LU TS, prostate nodule and ED that presents today for routine 38-month evaluation and management.   BPH WITH LUTS  (prostate and/or bladder) IPSS score: 2/2     Previous score: 2/2     Previous PVR: 5 mL  Major complaint(s):  ***.  Patient denies any modifying or aggravating factors.  Patient denies any gross hematuria, dysuria or suprapubic/flank pain.  Patient denies any fevers, chills, nausea or vomiting.   Currently taking: finasteride 5 mg daily   He does not have a family history of PCa.    Score:  1-7 Mild 8-19 Moderate 20-35 Severe   Prostate Nodule Stable per Dr. Bjorn Loser exam.   Last PSA (11/09/2018) was 2.9 ng/mL.  Erectile dysfunction His SHIM score is ***, which is *** ED.  Previous SHIM: 17    His risk factors for ED are age, BPH, DM, HTN, HLD and hypothyroidism.   He is no longer having spontaneous erections.  He denies any pain or curvature with erections.  He has taken PDE 5 inhibitors with good results.    Score: 1-7 Severe ED 8-11 Moderate ED 12-16 Mild-Moderate ED 17-21 Mild ED 22-25 No ED     PMH: Past Medical History:  Diagnosis Date   Diabetes mellitus without complication ([facility])    GERD (gastroesophageal reflux disease)    Gout    Hyperlipidemia    Hypertension    Hypothyroidism    Wears dentures    partial upper and lower    Surgical History: Past Surgical History:  Procedure Laterality Date   CATARACT EXTRACTION W/PHACO Left 05/02/2018   Procedure: CATARACT EXTRACTION PHACO AND INTRAOCULAR LENS PLACEMENT ([place]) DIABETIC;  Surgeon: Leandrew Koyanagi, MD;  Location: [place];  Service: Ophthalmology;  Laterality: Left;  diabetic - insulin   COLONOSCOPY      HERNIA REPAIR     TONSILLECTOMY      Home Medications:  Allergies as of 05/14/2020   No Known Allergies     Medication List       Accurate as of May 13, 2020  8:20 AM. If you have any questions, ask your nurse or doctor.        allopurinol 100 MG tablet Commonly known as: ZYLOPRIM Take 100 mg by mouth daily.   aspirin 81 MG tablet Take 81 mg by mouth daily.   famotidine 20 MG tablet Commonly known as: Pepcid Take 1 tablet (20 mg total) by mouth daily.   finasteride 5 MG tablet Commonly known as: PROSCAR Take 5 mg by mouth daily.   gabapentin 100 MG capsule Commonly known as: NEURONTIN Take 100 mg by mouth daily.   hydrochlorothiazide 25 MG tablet Commonly known as: HYDRODIURIL Take 25 mg by mouth daily.   insulin NPH-regular Human (70-30) 100 UNIT/ML injection Inject into the skin. 28 units at breakfast. 8 units at bedtime.   levothyroxine 75 MCG tablet Commonly known as: SYNTHROID Take 75 mcg by mouth daily before breakfast.   lisinopril 40 MG tablet Commonly known as: ZESTRIL Take 40 mg by mouth daily.   metoprolol succinate 100 MG 24 hr tablet Commonly known as: TOPROL-XL Take by mouth.   omeprazole 20 MG capsule Commonly known as: PRILOSEC Take 20 mg  by mouth daily.   simvastatin 40 MG tablet Commonly known as: ZOCOR Take 40 mg by mouth daily.       Allergies: No Known Allergies  Family History: No family history on file.  Social History:  reports that he has never smoked. He has never used smokeless tobacco. He reports current alcohol use. No history on file for drug use.  ROS: For pertinent review of systems please refer to history of present illness  Physical Exam: There were no vitals taken for this visit.  Constitutional:  Well nourished. Alert and oriented, No acute distress. HEENT: [place] AT, moist mucus membranes.  Trachea midline Cardiovascular: No clubbing, cyanosis, or edema. Respiratory: Normal respiratory effort, no  increased work of breathing. GI: Abdomen is soft, non tender, non distended, no abdominal masses. Liver and spleen not palpable.  No hernias appreciated.  Stool sample for occult testing is not indicated.   GU: No CVA tenderness.  No bladder fullness or masses.  Patient with circumcised/uncircumcised phallus. ***Foreskin easily retracted***  Urethral meatus is patent.  No penile discharge. No penile lesions or rashes. Scrotum without lesions, cysts, rashes and/or edema.  Testicles are located scrotally bilaterally. No masses are appreciated in the testicles. Left and right epididymis are normal. Rectal: Patient with  normal sphincter tone. Anus and perineum without scarring or rashes. No rectal masses are appreciated. Prostate is approximately *** grams, *** nodules are appreciated. Seminal vesicles are normal. Skin: No rashes, bruises or suspicious lesions. Lymph: No inguinal adenopathy. Neurologic: Grossly intact, no focal deficits, moving all 4 extremities. Psychiatric: Normal mood and affect.   Laboratory Results  Prostate Specific Antigen, Serum Ref: 0.1 - 4 ng/mL  02/07/2013 08:53 AM 2.6  09/04/2013 12:17 PM 2.5  04/02/2014 02:08 PM 2.8  04/10/2015 11:36 AM 2.68  05/26/2016 9:36 AM 2.17  08/03/2017 10:59 AM 2.12   Component     Latest Ref Rng & Units 04/10/2018 11/09/2018 05/15/2019  Prostate Specific Ag, Serum     0.0 - 4.0 ng/mL 2.7 2.9 2.7   Assessment & Plan:    1. BPH with LUTS IPSS score is 2/2, it is improving Continue conservative management, avoiding bladder irritants and timed voiding's Continue finasteride 5 mg daily RTC in 12 months for IPSS, PSA and exam   2. Prostate Nodule ***  3. Erectile dysfunction SHIM score is 17 Continue sildenafil 20 mg, refills given  RTC in 12 months for repeat SHIM score and exam    No follow-ups on file.  Zara Council, PA-C [facility] [address] [place],  [address] [phone]

## 2020-05-14 ENCOUNTER — Ambulatory Visit: Payer: PPO | Admitting: Urology

## 2020-05-15 ENCOUNTER — Encounter: Payer: Self-pay | Admitting: Urology

## 2020-05-26 ENCOUNTER — Other Ambulatory Visit: Payer: Self-pay

## 2020-05-26 ENCOUNTER — Other Ambulatory Visit: Payer: PPO

## 2020-05-26 DIAGNOSIS — N401 Enlarged prostate with lower urinary tract symptoms: Secondary | ICD-10-CM | POA: Diagnosis not present

## 2020-05-26 DIAGNOSIS — N138 Other obstructive and reflux uropathy: Secondary | ICD-10-CM | POA: Diagnosis not present

## 2020-05-27 LAB — PSA: Prostate Specific Ag, Serum: 2.2 ng/mL (ref 0.0–4.0)

## 2020-05-28 NOTE — Progress Notes (Signed)
05/29/2020  11:16 AM   Glen Martinez August 02, 1931 [id number]  Referring provider: Tracie Harrier, MD [address] [facility] [place],  [place] [address]  Chief Complaint  Patient presents with  . Benign Prostatic Hypertrophy    HPI: Glen Martinez is a 86 y.o.  male with BPH and LU TS, prostate nodule and ED that presents today for routine 68-month evaluation and management.   BPH WITH LUTS  (prostate and/or bladder) IPSS score: 2/1    Previous score: 2/2     Previous PVR: 5 mL  Major complaint(s):  None.  Patient denies any modifying or aggravating factors.  Patient denies any gross hematuria, dysuria or suprapubic/flank pain.  Patient denies any fevers, chills, nausea or vomiting.   Currently taking: finasteride 5 mg daily   He does not have a family history of PCa.   IPSS    Row Name 05/29/20 1100         International Prostate Symptom Score   How often have you had the sensation of not emptying your bladder? Not at All     How often have you had to urinate less than every two hours? Not at All     How often have you found you stopped and started again several times when you urinated? Not at All     How often have you found it difficult to postpone urination? Less than 1 in 5 times     How often have you had a weak urinary stream? Not at All     How often have you had to strain to start urination? Not at All     How many times did you typically get up at night to urinate? 1 Time     Total IPSS Score 2       Quality of Life due to urinary symptoms   If you were to spend the rest of your life with your urinary condition just the way it is now how would you feel about that? Pleased            Score:  1-7 Mild 8-19 Moderate 20-35 Severe   Prostate Nodule Stable per Dr. Bjorn Loser exam.   Last PSA (05/2020) was 2.2 ng/mL.  Erectile dysfunction His SHIM score is 16, which is mild to moderate ED.  Previous SHIM: 17    His risk factors for ED are  age, BPH, DM, HTN, HLD and hypothyroidism.   He is no longer having spontaneous erections.  He denies any pain or curvature with erections.  He has taken PDE 5 inhibitors with good results.   SHIM    Row Name 05/29/20 1103         SHIM: Over the last 6 months:   How do you rate your confidence that you could get and keep an erection? Moderate     When you had erections with sexual stimulation, how often were your erections hard enough for penetration (entering your partner)? A Few Times (much less than half the time)     During sexual intercourse, how often were you able to maintain your erection after you had penetrated (entered) your partner? A Few Times (much less than half the time)     During sexual intercourse, how difficult was it to maintain your erection to completion of intercourse? Slightly Difficult     When you attempted sexual intercourse, how often was it satisfactory for you? Almost Always or Always  SHIM Total Score   SHIM 16            Score: 1-7 Severe ED 8-11 Moderate ED 12-16 Mild-Moderate ED 17-21 Mild ED 22-25 No ED  PMH: Past Medical History:  Diagnosis Date  . Diabetes mellitus without complication ([facility])   . GERD (gastroesophageal reflux disease)   . Gout   . Hyperlipidemia   . Hypertension   . Hypothyroidism   . Wears dentures    partial upper and lower    Surgical History: Past Surgical History:  Procedure Laterality Date  . CATARACT EXTRACTION W/PHACO Left 05/02/2018   Procedure: CATARACT EXTRACTION PHACO AND INTRAOCULAR LENS PLACEMENT ([place]) DIABETIC;  Surgeon: Leandrew Koyanagi, MD;  Location: [facility];  Service: Ophthalmology;  Laterality: Left;  diabetic - insulin  . COLONOSCOPY    . HERNIA REPAIR    . TONSILLECTOMY      Home Medications:  Allergies as of 05/29/2020   No Known Allergies     Medication List       Accurate as of May 29, 2020 11:16 AM. If you have any questions, ask your nurse or doctor.          allopurinol 100 MG tablet Commonly known as: ZYLOPRIM Take 100 mg by mouth daily.   aspirin 81 MG tablet Take 81 mg by mouth daily.   atenolol 100 MG tablet Commonly known as: TENORMIN Take by mouth.   B-12 1000 MCG Tabs Take 1 tablet by mouth daily.   famotidine 20 MG tablet Commonly known as: Pepcid Take 1 tablet (20 mg total) by mouth daily.   finasteride 5 MG tablet Commonly known as: PROSCAR Take 5 mg by mouth daily.   gabapentin 300 MG capsule Commonly known as: NEURONTIN Take 300 mg by mouth at bedtime. What changed: Another medication with the same name was removed. Continue taking this medication, and follow the directions you see here. Changed by: Zara Council, PA-C   hydrochlorothiazide 25 MG tablet Commonly known as: HYDRODIURIL Take 25 mg by mouth daily.   insulin NPH-regular Human (70-30) 100 UNIT/ML injection Inject into the skin. 28 units at breakfast. 8 units at bedtime.   levothyroxine 75 MCG tablet Commonly known as: SYNTHROID Take 75 mcg by mouth daily before breakfast.   lisinopril 40 MG tablet Commonly known as: ZESTRIL Take 40 mg by mouth daily.   metoprolol succinate 100 MG 24 hr tablet Commonly known as: TOPROL-XL Take by mouth.   nitroGLYCERIN 0.4 MG SL tablet Commonly known as: NITROSTAT SMARTSIG:1 Tablet(s) Sublingual PRN   omeprazole 20 MG capsule Commonly known as: PRILOSEC Take 20 mg by mouth daily.   simvastatin 40 MG tablet Commonly known as: ZOCOR Take 40 mg by mouth daily.       Allergies: No Known Allergies  Family History: No family history on file.  Social History:  reports that he has never smoked. He has never used smokeless tobacco. He reports current alcohol use. No history on file for drug use.  ROS: For pertinent review of systems please refer to history of present illness  Physical Exam: BP (!) 179/80   Pulse (!) 51   Ht 5\' 6"  (1.676 m)   Wt 185 lb (83.9 kg)   BMI 29.86 kg/m    Constitutional:  Well nourished. Alert and oriented, No acute distress. HEENT:  AT, mask in place.  Trachea midline Cardiovascular: No clubbing, cyanosis, or edema. Respiratory: Normal respiratory effort, no increased work of breathing. GU: No CVA tenderness.  No bladder fullness or masses.  Patient with uncircumcised phallus. Foreskin easily retracted  Urethral meatus is patent.  No penile discharge. No penile lesions or rashes. Scrotum without lesions, cysts, rashes and/or edema.  Testicles are located scrotally bilaterally. No masses are appreciated in the testicles. Left and right epididymis are normal. Rectal: Patient with  normal sphincter tone. Anus and perineum without scarring or rashes. No rectal masses are appreciated. Prostate is approximately 50 grams, could only palpate the apex and midportion, 1 cm nodule in the right lobe.  Seminal vesicles could not be palpated.   Skin: No rashes, bruises or suspicious lesions. Lymph: No inguinal adenopathy. Neurologic: Grossly intact, no focal deficits, moving all 4 extremities. Psychiatric: Normal mood and affect.   Laboratory Results  Prostate Specific Antigen, Serum Ref: 0.1 - 4 ng/mL  02/07/2013 08:53 AM 2.6  09/04/2013 12:17 PM 2.5  04/02/2014 02:08 PM 2.8  04/10/2015 11:36 AM 2.68  05/26/2016 9:36 AM 2.17  08/03/2017 10:59 AM 2.12    Component     Latest Ref Rng & Units 04/10/2018 11/09/2018 05/15/2019 05/26/2020  Prostate Specific Ag, Serum     0.0 - 4.0 ng/mL 2.7 2.9 2.7 2.2   Assessment & Plan:    1. BPH with LUTS IPSS score is 2/1, it is improving Continue conservative management, avoiding bladder irritants and timed voiding's Continue finasteride 5 mg daily RTC in 12 months for IPSS, PSA and exam   2. Prostate Nodule Unchanged  3. Erectile dysfunction SHIM score is 16 Continue sildenafil 20 mg, refills given  RTC in 12 months for repeat SHIM score and exam    Return in about 1 year (around 05/29/2021) for  IPSS, SHIM, PSA and exam.  Zara Council, [facility] [address] [place], [place] [address] [phone]

## 2020-05-29 ENCOUNTER — Encounter: Payer: Self-pay | Admitting: Urology

## 2020-05-29 ENCOUNTER — Other Ambulatory Visit: Payer: Self-pay

## 2020-05-29 ENCOUNTER — Ambulatory Visit (INDEPENDENT_AMBULATORY_CARE_PROVIDER_SITE_OTHER): Payer: PPO | Admitting: Urology

## 2020-05-29 VITALS — BP 179/80 | HR 51 | Ht 66.0 in | Wt 185.0 lb

## 2020-05-29 DIAGNOSIS — N529 Male erectile dysfunction, unspecified: Secondary | ICD-10-CM

## 2020-05-29 DIAGNOSIS — N401 Enlarged prostate with lower urinary tract symptoms: Secondary | ICD-10-CM

## 2020-05-29 DIAGNOSIS — N402 Nodular prostate without lower urinary tract symptoms: Secondary | ICD-10-CM

## 2020-05-29 DIAGNOSIS — N138 Other obstructive and reflux uropathy: Secondary | ICD-10-CM | POA: Diagnosis not present

## 2020-08-24 DIAGNOSIS — N1831 Chronic kidney disease, stage 3a: Secondary | ICD-10-CM | POA: Diagnosis not present

## 2020-08-24 DIAGNOSIS — E1140 Type 2 diabetes mellitus with diabetic neuropathy, unspecified: Secondary | ICD-10-CM | POA: Diagnosis not present

## 2020-08-24 DIAGNOSIS — E039 Hypothyroidism, unspecified: Secondary | ICD-10-CM | POA: Diagnosis not present

## 2020-08-24 DIAGNOSIS — E1122 Type 2 diabetes mellitus with diabetic chronic kidney disease: Secondary | ICD-10-CM | POA: Diagnosis not present

## 2020-08-24 DIAGNOSIS — Z0000 Encounter for general adult medical examination without abnormal findings: Secondary | ICD-10-CM | POA: Diagnosis not present

## 2020-08-24 DIAGNOSIS — Z794 Long term (current) use of insulin: Secondary | ICD-10-CM | POA: Diagnosis not present

## 2020-08-24 DIAGNOSIS — E7800 Pure hypercholesterolemia, unspecified: Secondary | ICD-10-CM | POA: Diagnosis not present

## 2020-08-31 DIAGNOSIS — E1140 Type 2 diabetes mellitus with diabetic neuropathy, unspecified: Secondary | ICD-10-CM | POA: Diagnosis not present

## 2020-08-31 DIAGNOSIS — I10 Essential (primary) hypertension: Secondary | ICD-10-CM | POA: Diagnosis not present

## 2020-08-31 DIAGNOSIS — D649 Anemia, unspecified: Secondary | ICD-10-CM | POA: Diagnosis not present

## 2020-08-31 DIAGNOSIS — N1831 Chronic kidney disease, stage 3a: Secondary | ICD-10-CM | POA: Diagnosis not present

## 2020-08-31 DIAGNOSIS — E1122 Type 2 diabetes mellitus with diabetic chronic kidney disease: Secondary | ICD-10-CM | POA: Diagnosis not present

## 2020-08-31 DIAGNOSIS — E039 Hypothyroidism, unspecified: Secondary | ICD-10-CM | POA: Diagnosis not present

## 2020-08-31 DIAGNOSIS — Z794 Long term (current) use of insulin: Secondary | ICD-10-CM | POA: Diagnosis not present

## 2020-09-16 DIAGNOSIS — M1712 Unilateral primary osteoarthritis, left knee: Secondary | ICD-10-CM | POA: Diagnosis not present

## 2020-09-16 DIAGNOSIS — M25562 Pain in left knee: Secondary | ICD-10-CM | POA: Diagnosis not present

## 2020-11-02 ENCOUNTER — Other Ambulatory Visit: Payer: Self-pay | Admitting: Urology

## 2020-12-22 DIAGNOSIS — E1122 Type 2 diabetes mellitus with diabetic chronic kidney disease: Secondary | ICD-10-CM | POA: Diagnosis not present

## 2020-12-22 DIAGNOSIS — I10 Essential (primary) hypertension: Secondary | ICD-10-CM | POA: Diagnosis not present

## 2020-12-22 DIAGNOSIS — D649 Anemia, unspecified: Secondary | ICD-10-CM | POA: Diagnosis not present

## 2020-12-22 DIAGNOSIS — N1831 Chronic kidney disease, stage 3a: Secondary | ICD-10-CM | POA: Diagnosis not present

## 2020-12-22 DIAGNOSIS — Z794 Long term (current) use of insulin: Secondary | ICD-10-CM | POA: Diagnosis not present

## 2020-12-22 DIAGNOSIS — E039 Hypothyroidism, unspecified: Secondary | ICD-10-CM | POA: Diagnosis not present

## 2020-12-22 DIAGNOSIS — E1140 Type 2 diabetes mellitus with diabetic neuropathy, unspecified: Secondary | ICD-10-CM | POA: Diagnosis not present

## 2020-12-29 DIAGNOSIS — M109 Gout, unspecified: Secondary | ICD-10-CM | POA: Diagnosis not present

## 2020-12-29 DIAGNOSIS — Z0000 Encounter for general adult medical examination without abnormal findings: Secondary | ICD-10-CM | POA: Diagnosis not present

## 2020-12-29 DIAGNOSIS — Z794 Long term (current) use of insulin: Secondary | ICD-10-CM | POA: Diagnosis not present

## 2020-12-29 DIAGNOSIS — E1142 Type 2 diabetes mellitus with diabetic polyneuropathy: Secondary | ICD-10-CM | POA: Diagnosis not present

## 2020-12-29 DIAGNOSIS — E039 Hypothyroidism, unspecified: Secondary | ICD-10-CM | POA: Diagnosis not present

## 2020-12-29 DIAGNOSIS — N189 Chronic kidney disease, unspecified: Secondary | ICD-10-CM | POA: Diagnosis not present

## 2020-12-29 DIAGNOSIS — I129 Hypertensive chronic kidney disease with stage 1 through stage 4 chronic kidney disease, or unspecified chronic kidney disease: Secondary | ICD-10-CM | POA: Diagnosis not present

## 2020-12-29 DIAGNOSIS — E1122 Type 2 diabetes mellitus with diabetic chronic kidney disease: Secondary | ICD-10-CM | POA: Diagnosis not present

## 2020-12-29 DIAGNOSIS — Z79899 Other long term (current) drug therapy: Secondary | ICD-10-CM | POA: Diagnosis not present

## 2021-04-20 DIAGNOSIS — Z20822 Contact with and (suspected) exposure to covid-19: Secondary | ICD-10-CM | POA: Diagnosis not present

## 2021-04-20 DIAGNOSIS — U071 COVID-19: Secondary | ICD-10-CM | POA: Diagnosis not present

## 2021-05-18 ENCOUNTER — Other Ambulatory Visit: Payer: Self-pay

## 2021-05-18 DIAGNOSIS — N138 Other obstructive and reflux uropathy: Secondary | ICD-10-CM

## 2021-05-31 ENCOUNTER — Other Ambulatory Visit: Payer: Self-pay

## 2021-06-01 ENCOUNTER — Encounter: Payer: Self-pay | Admitting: Urology

## 2021-06-03 NOTE — Progress Notes (Deleted)
06/04/2021  7:30 AM   Glen Martinez [birth year] [id number]  Referring provider: Tracie Harrier, MD [address] [facility] [place],  [place] [address]  Urological history: 1.  BPH with LU TS -PSA pending -I PSS *** -Finasteride 5 mg daily  2.  Prostate nodule -1 cm nodule in the right lobe x stable on serial exams  3. ED -Contributing factors of ED are age, BPH, DM, HTN, HLD and hypothyroidism -Sildenafil 20 mg, on-demand-dosing  No chief complaint on file.   HPI: Glen Martinez is a 87 y.o.  male who presents today for routine 52-monthevaluation and management.       Score:  1-7 Mild 8-19 Moderate 20-35 Severe        Score: 1-7 Severe ED 8-11 Moderate ED 12-16 Mild-Moderate ED 17-21 Mild ED 22-25 No ED  PMH: Past Medical History:  Diagnosis Date   Diabetes mellitus without complication ([facility])    GERD (gastroesophageal reflux disease)    Gout    Hyperlipidemia    Hypertension    Hypothyroidism    Wears dentures    partial upper and lower    Surgical History: Past Surgical History:  Procedure Laterality Date   CATARACT EXTRACTION W/PHACO Left 05/02/2018   Procedure: CATARACT EXTRACTION [place] AND [place] [facility];  Surgeon: BLeandrew Koyanagi MD;  Location: [facility]  Service: Ophthalmology;  Laterality: Left;  diabetic - insulin   COLONOSCOPY     HERNIA REPAIR     TONSILLECTOMY      Home Medications:  Allergies as of 06/04/2021   No Known Allergies      Medication List        Accurate as of June 03, 2021  7:30 AM. If you have any questions, ask your nurse or doctor.          allopurinol 100 MG tablet Commonly known as: ZYLOPRIM Take 100 mg by mouth daily.   aspirin 81 MG tablet Take 81 mg by mouth daily.   atenolol 100 MG tablet Commonly known as: TENORMIN Take by mouth.   B-12 1000 MCG Tabs Take 1 tablet by mouth daily.   famotidine 20 MG tablet Commonly  known as: Pepcid Take 1 tablet (20 mg total) by mouth daily.   finasteride 5 MG tablet Commonly known as: PROSCAR Take 5 mg by mouth daily.   gabapentin 300 MG capsule Commonly known as: NEURONTIN Take 300 mg by mouth at bedtime.   hydrochlorothiazide 25 MG tablet Commonly known as: HYDRODIURIL Take 25 mg by mouth daily.   insulin NPH-regular Human (70-30) 100 UNIT/ML injection Inject into the skin. 28 units at breakfast. 8 units at bedtime.   levothyroxine 75 MCG tablet Commonly known as: SYNTHROID Take 75 mcg by mouth daily before breakfast.   lisinopril 40 MG tablet Commonly known as: ZESTRIL Take 40 mg by mouth daily.   metoprolol succinate 100 MG 24 hr tablet Commonly known as: TOPROL-XL Take by mouth.   nitroGLYCERIN 0.4 MG SL tablet Commonly known as: NITROSTAT SMARTSIG:1 Tablet(s) Sublingual PRN   omeprazole 20 MG capsule Commonly known as: PRILOSEC Take 20 mg by mouth daily.   sildenafil 20 MG tablet Commonly known as: REVATIO TAKE 3 TO 5 TABLETS TWO HOURS BEFORE INTERCOURSE ON AN EMPTY STOMACH. DO NOT TAKE WITH NITRATES   simvastatin 40 MG tablet Commonly known as: ZOCOR Take 40 mg by mouth daily.        Allergies: No Known Allergies  Family History:  No family history on file.  Social History:  reports that he has never smoked. He has never used smokeless tobacco. He reports current alcohol use. No history on file for drug use.  ROS: For pertinent review of systems please refer to history of present illness  Physical Exam: There were no vitals taken for this visit.  Constitutional:  Well nourished. Alert and oriented, No acute distress. HEENT: [place] AT, moist mucus membranes.  Trachea midline Cardiovascular: No clubbing, cyanosis, or edema. Respiratory: Normal respiratory effort, no increased work of breathing. GI: Abdomen is soft, non tender, non distended, no abdominal masses. Liver and spleen not palpable.  No hernias appreciated.  Stool  sample for occult testing is not indicated.   GU: No CVA tenderness.  No bladder fullness or masses.  Patient with circumcised/uncircumcised phallus. ***Foreskin easily retracted***  Urethral meatus is patent.  No penile discharge. No penile lesions or rashes. Scrotum without lesions, cysts, rashes and/or edema.  Testicles are located scrotally bilaterally. No masses are appreciated in the testicles. Left and right epididymis are normal. Rectal: Patient with  normal sphincter tone. Anus and perineum without scarring or rashes. No rectal masses are appreciated. Prostate is approximately *** grams, *** nodules are appreciated. Seminal vesicles are normal. Skin: No rashes, bruises or suspicious lesions. Lymph: No inguinal adenopathy. Neurologic: Grossly intact, no focal deficits, moving all 4 extremities. Psychiatric: Normal mood and affect.   Laboratory Results WBC (White Blood Cell Count) 4.1 - 10.2 10^3/uL 10.0   RBC (Red Blood Cell Count) 4.69 - 6.13 10^6/uL 4.62 Low    Hemoglobin 14.1 - 18.1 gm/dL 14.0 Low    Hematocrit 40.0 - 52.0 % 43.9   MCV (Mean Corpuscular Volume) 80.0 - 100.0 fl 95.0   MCH (Mean Corpuscular Hemoglobin) 27.0 - 31.2 pg 30.3   MCHC (Mean Corpuscular Hemoglobin Concentration) 32.0 - 36.0 gm/dL 31.9 Low    Platelet Count 150 - 450 10^3/uL 135 Low    RDW-CV (Red Cell Distribution Width) 11.6 - 14.8 % 13.6   MPV (Mean Platelet Volume) 9.4 - 12.4 fl 12.2   Neutrophils 1.50 - 7.80 10^3/uL 4.79   Lymphocytes 1.00 - 3.60 10^3/uL 4.09 High    Monocytes 0.00 - 1.50 10^3/uL 0.85   Eosinophils 0.00 - 0.55 10^3/uL 0.21   Basophils 0.00 - 0.09 10^3/uL 0.06   Neutrophil % 32.0 - 70.0 % 47.8   Lymphocyte % 10.0 - 50.0 % 40.8   Monocyte % 4.0 - 13.0 % 8.5   Eosinophil % 1.0 - 5.0 % 2.1   Basophil% 0.0 - 2.0 % 0.6   Immature Granulocyte % <=0.7 % 0.2   Immature Granulocyte Count <=0.06 10^3/L 0.02   Resulting Agency  [facility] - [facility]  Specimen Collected: 12/22/20 08:59  Last Resulted: 12/22/20 09:22  Received From: [facility]  Result Received: 05/18/21 10:26   Glucose 70 - 110 mg/dL 154 High    Sodium 136 - 145 mmol/L 141   Potassium 3.6 - 5.1 mmol/L 4.1   Chloride 97 - 109 mmol/L 104   Carbon Dioxide (CO2) 22.0 - 32.0 mmol/L 30.6   Urea Nitrogen (BUN) 7 - 25 mg/dL 18   Creatinine 0.7 - 1.3 mg/dL 1.3   Glomerular Filtration Rate (eGFR), MDRD Estimate >60 mL/min/1.73sq m 63   Calcium 8.7 - 10.3 mg/dL 9.1   AST  8 - 39 U/L 13   ALT  6 - 57 U/L 10   Alk Phos (alkaline Phosphatase) 34 - 104  U/L 107 High    Albumin 3.5 - 4.8 g/dL 4.3   Bilirubin, Total 0.3 - 1.2 mg/dL 0.5   Protein, Total 6.1 - 7.9 g/dL 6.7   A/G Ratio 1.0 - 5.0 gm/dL 1.8   Resulting [facility] - [facility]  Specimen Collected: 12/22/20 08:59 Last Resulted: 12/22/20 11:53  Received From: [facility]  Result Received: 05/18/21 10:26   Hemoglobin A1C 4.2 - 5.6 % 7.4 High    Average Blood Glucose (Calc) mg/dL [facility]  Narrative Performed by [facility] - [facility] Normal Range:    4.2 - 5.6%  Increased Risk:  5.7 - 6.4%  Diabetes:        >= 6.5%  Glycemic Control for adults with diabetes:  <7%   Specimen Collected: 12/22/20 08:59 Last Resulted: 12/22/20 10:55  Received From: [facility]  Result Received: 05/18/21 10:26   Cholesterol, Total 100 - 200 mg/dL 177   Triglyceride 35 - 199 mg/dL 129   HDL (High Density Lipoprotein) Cholesterol 29.0 - 71.0 mg/dL 64.2   LDL Calculated 0 - 130 mg/dL 87   VLDL Cholesterol mg/dL 26   Cholesterol/HDL Ratio  2.8   Resulting Agency  [facility] - [facility]  Specimen Collected: 12/22/20 08:59 Last Resulted: 12/22/20 11:53  Received From: [facility]  Result Received: 05/18/21 10:26   Thyroid Stimulating Hormone (TSH) 0.450-5.330 uIU/ml uIU/mL 9.960 High    Resulting Agency  [facility] - [facility]  Specimen  Collected: 12/22/20 08:59 Last Resulted: 12/22/20 11:50  Received From: [facility]  Result Received: 05/18/21 10:26   Color Yellow, Violet, Light Violet, Dark Violet Yellow   Clarity Clear Clear   Specific Gravity 1.000 - 1.030 1.020   pH, Urine 5.0 - 8.0 6.0   Protein, Urinalysis Negative, Trace mg/dL Negative   Glucose, Urinalysis Negative mg/dL Negative   Ketones, Urinalysis Negative mg/dL Negative   Blood, Urinalysis Negative Negative   Nitrite, Urinalysis Negative Negative   Leukocyte Esterase, Urinalysis Negative Negative   White Blood Cells, Urinalysis None Seen, 0-3 /hpf None Seen   Red Blood Cells, Urinalysis None Seen, 0-3 /hpf None Seen   Bacteria, Urinalysis None Seen /hpf None Seen   Squamous Epithelial Cells, Urinalysis Rare, Few, None Seen /hpf None Seen   Resulting Agency  [facility] - [facility]  Specimen Collected: 12/22/20 08:59 Last Resulted: 12/22/20 10:35  Received From: [facility]  Result Received: 05/18/21 10:26  I have reviewed the labs.   Assessment & Plan:    1. BPH with LUTS IPSS score is 2/1, it is improving Continue conservative management, avoiding bladder irritants and timed voiding's Continue finasteride 5 mg daily RTC in 12 months for IPSS, PSA and exam   2. Prostate Nodule Unchanged  3. Erectile dysfunction SHIM score is 16 Continue sildenafil 20 mg, refills given  RTC in 12 months for repeat SHIM score and exam    No follow-ups on file.  Zara Council, PA-C [facility] [address], [place] , [place] [address] [phone]

## 2021-06-04 ENCOUNTER — Ambulatory Visit: Payer: Self-pay | Admitting: Urology

## 2021-06-04 DIAGNOSIS — N529 Male erectile dysfunction, unspecified: Secondary | ICD-10-CM

## 2021-06-04 DIAGNOSIS — N402 Nodular prostate without lower urinary tract symptoms: Secondary | ICD-10-CM

## 2021-06-04 DIAGNOSIS — N138 Other obstructive and reflux uropathy: Secondary | ICD-10-CM

## 2021-06-07 ENCOUNTER — Other Ambulatory Visit: Payer: Self-pay

## 2021-06-07 ENCOUNTER — Other Ambulatory Visit: Payer: Medicare HMO

## 2021-06-07 DIAGNOSIS — N401 Enlarged prostate with lower urinary tract symptoms: Secondary | ICD-10-CM | POA: Diagnosis not present

## 2021-06-07 DIAGNOSIS — N138 Other obstructive and reflux uropathy: Secondary | ICD-10-CM

## 2021-06-08 LAB — PSA: Prostate Specific Ag, Serum: 2.5 ng/mL (ref 0.0–4.0)

## 2021-06-14 NOTE — Progress Notes (Signed)
06/15/2021  2:02 PM   Glen Martinez 29-Mar-1931 [id number]  Referring provider: Tracie Harrier, [facility] [place],  [place] [address]  Urological history: 1.  BPH with LU TS -PSA 2.5 in 05/2021 -I PSS 9/2 -managed with finasteride 5 mg daily  2.  Prostate nodule -1 cm nodule in the right lobe x stable on serial exams  3. ED -contributing factors of ED are age, BPH, DM, HTN, HLD and hypothyroidism -SHIM 16  -Sildenafil 20 mg, on-demand-dosing  Chief Complaint  Patient presents with   Benign Prostatic Hypertrophy     HPI: Glen Martinez is a 87 y.o.  male who presents today for routine 82-monthevaluation and management.   Patient denies any modifying or aggravating factors.  Patient denies any gross hematuria, dysuria or suprapubic/flank pain.  Patient denies any fevers, chills, nausea or vomiting.     IPSS     Row Name 06/15/21 1300         International Prostate Symptom Score   How often have you had the sensation of not emptying your bladder? Less than half the time     How often have you had to urinate less than every two hours? Less than 1 in 5 times     How often have you found you stopped and started again several times when you urinated? Less than half the time     How often have you found it difficult to postpone urination? Less than 1 in 5 times     How often have you had a weak urinary stream? Less than half the time     How often have you had to strain to start urination? Not at All     How many times did you typically get up at night to urinate? 1 Time     Total IPSS Score 9           Quality of Life due to urinary symptoms   If you were to spend the rest of your life with your urinary condition just the way it is now how would you feel about that? Mostly Satisfied               Score:  1-7 Mild 8-19 Moderate 20-35 Severe  Patient still having spontaneous erections.  He denies any pain or curvature  with erections.     SHIM     Row Name 06/15/21 1346         SHIM: Over the last 6 months:   How do you rate your confidence that you could get and keep an erection? High     When you had erections with sexual stimulation, how often were your erections hard enough for penetration (entering your partner)? A Few Times (much less than half the time)     During sexual intercourse, how often were you able to maintain your erection after you had penetrated (entered) your partner? A Few Times (much less than half the time)     During sexual intercourse, how difficult was it to maintain your erection to completion of intercourse? Slightly Difficult     When you attempted sexual intercourse, how often was it satisfactory for you? Most Times (much more than half the time)           SHIM Total Score   SHIM 16               Score: 1-7 Severe ED 8-11 Moderate ED 12-16  Mild-Moderate ED 17-21 Mild ED 22-25 No ED  PMH: Past Medical History:  Diagnosis Date   Diabetes mellitus without complication (HCC)    GERD (gastroesophageal reflux disease)    Gout    Hyperlipidemia    Hypertension    Hypothyroidism    Wears dentures    partial upper and lower    Surgical History: Past Surgical History:  Procedure Laterality Date   CATARACT EXTRACTION W/PHACO Left 05/02/2018   Procedure: CATARACT EXTRACTION PHACO AND INTRAOCULAR LENS PLACEMENT ([place]) DIABETIC;  Surgeon: Leandrew Koyanagi, MD;  Location: [place];  Service: Ophthalmology;  Laterality: Left;  diabetic - insulin   COLONOSCOPY     HERNIA REPAIR     TONSILLECTOMY      Home Medications:  Allergies as of 06/15/2021   No Known Allergies      Medication List        Accurate as of June 15, 2021  2:02 PM. If you have any questions, ask your nurse or doctor.          allopurinol 100 MG tablet Commonly known as: ZYLOPRIM Take 100 mg by mouth daily.   aspirin 81 MG tablet Take 81 mg by mouth daily.    atenolol 100 MG tablet Commonly known as: TENORMIN Take by mouth.   B-12 1000 MCG Tabs Take 1 tablet by mouth daily.   famotidine 20 MG tablet Commonly known as: Pepcid Take 1 tablet (20 mg total) by mouth daily.   finasteride 5 MG tablet Commonly known as: PROSCAR Take 1 tablet (5 mg total) by mouth daily.   gabapentin 300 MG capsule Commonly known as: NEURONTIN Take 300 mg by mouth at bedtime.   hydrochlorothiazide 25 MG tablet Commonly known as: HYDRODIURIL Take 25 mg by mouth daily.   insulin NPH-regular Human (70-30) 100 UNIT/ML injection Inject into the skin. 28 units at breakfast. 8 units at bedtime.   levothyroxine 75 MCG tablet Commonly known as: SYNTHROID Take 75 mcg by mouth daily before breakfast.   lisinopril 40 MG tablet Commonly known as: ZESTRIL Take 40 mg by mouth daily.   metoprolol succinate 100 MG 24 hr tablet Commonly known as: TOPROL-XL Take by mouth.   nitroGLYCERIN 0.4 MG SL tablet Commonly known as: NITROSTAT SMARTSIG:1 Tablet(s) Sublingual PRN   omeprazole 20 MG capsule Commonly known as: PRILOSEC Take 20 mg by mouth daily.   sildenafil 20 MG tablet Commonly known as: REVATIO TAKE 3 TO 5 TABLETS TWO HOURS BEFORE INTERCOURSE ON AN EMPTY STOMACH. DO NOT TAKE WITH NITRATES   simvastatin 40 MG tablet Commonly known as: ZOCOR Take 40 mg by mouth daily.        Allergies: No Known Allergies  Family History: No family history on file.  Social History:  reports that he has never smoked. He has never used smokeless tobacco. He reports current alcohol use. No history on file for drug use.  ROS: For pertinent review of systems please refer to history of present illness  Physical Exam: BP (!) 161/70   Pulse 61   Ht 5' 6" (1.676 m)   Wt 185 lb (83.9 kg)   BMI 29.86 kg/m   Constitutional:  Well nourished. Alert and oriented, No acute distress. HEENT: [place] AT, mask in place.  Trachea midline Cardiovascular: No clubbing, cyanosis,  or edema. Respiratory: Normal respiratory effort, no increased work of breathing. GU: No CVA tenderness.  No bladder fullness or masses.  Patient with uncircumcised phallus.  Foreskin easily retracted  Urethral meatus  is patent.  No penile discharge. No penile lesions or rashes. Scrotum without lesions, cysts, rashes and/or edema.  Testicles are located scrotally bilaterally. No masses are appreciated in the testicles. Left and right epididymis are normal. Rectal: Patient with  normal sphincter tone. Anus and perineum without scarring or rashes. No rectal masses are appreciated. Prostate is approximately 50 + grams, could only palpate the apex and midportion of gland, 1 cm rubbery nodule in the right apex.  Seminal vesicles could not be palpated  Neurologic: Grossly intact, no focal deficits, moving all 4 extremities. Psychiatric: Normal mood and affect.   Laboratory Results WBC (White Blood Cell Count) 4.1 - 10.2 10^3/uL 10.0   RBC (Red Blood Cell Count) 4.69 - 6.13 10^6/uL 4.62 Low    Hemoglobin 14.1 - 18.1 gm/dL 14.0 Low    Hematocrit 40.0 - 52.0 % 43.9   MCV (Mean Corpuscular Volume) 80.0 - 100.0 fl 95.0   MCH (Mean Corpuscular Hemoglobin) 27.0 - 31.2 pg 30.3   MCHC (Mean Corpuscular Hemoglobin Concentration) 32.0 - 36.0 gm/dL 31.9 Low    Platelet Count 150 - 450 10^3/uL 135 Low    RDW-CV (Red Cell Distribution Width) 11.6 - 14.8 % 13.6   MPV (Mean Platelet Volume) 9.4 - 12.4 fl 12.2   Neutrophils 1.50 - 7.80 10^3/uL 4.79   Lymphocytes 1.00 - 3.60 10^3/uL 4.09 High    Monocytes 0.00 - 1.50 10^3/uL 0.85   Eosinophils 0.00 - 0.55 10^3/uL 0.21   Basophils 0.00 - 0.09 10^3/uL 0.06   Neutrophil % 32.0 - 70.0 % 47.8   Lymphocyte % 10.0 - 50.0 % 40.8   Monocyte % 4.0 - 13.0 % 8.5   Eosinophil % 1.0 - 5.0 % 2.1   Basophil% 0.0 - 2.0 % 0.6   Immature Granulocyte % <=0.7 % 0.2   Immature Granulocyte Count <=0.06 10^3/L 0.02   Resulting Agency  [facility] - [facility]  Specimen Collected:  12/22/20 08:59 Last Resulted: 12/22/20 09:22  Received From: [place]  Result Received: 05/18/21 10:26   Glucose 70 - 110 mg/dL 154 High    Sodium 136 - 145 mmol/L 141   Potassium 3.6 - 5.1 mmol/L 4.1   Chloride 97 - 109 mmol/L 104   Carbon Dioxide (CO2) 22.0 - 32.0 mmol/L 30.6   Urea Nitrogen (BUN) 7 - 25 mg/dL 18   Creatinine 0.7 - 1.3 mg/dL 1.3   Glomerular Filtration Rate (eGFR), MDRD Estimate >60 mL/min/1.73sq m 63   Calcium 8.7 - 10.3 mg/dL 9.1   AST  8 - 39 U/L 13   ALT  6 - 57 U/L 10   Alk Phos (alkaline Phosphatase) 34 - 104 U/L 107 High    Albumin 3.5 - 4.8 g/dL 4.3   Bilirubin, Total 0.3 - 1.2 mg/dL 0.5   Protein, Total 6.1 - 7.9 g/dL 6.7   A/G Ratio 1.0 - 5.0 gm/dL 1.8   Resulting Agency  [place] - [facility]  Specimen Collected: 12/22/20 08:59 Last Resulted: 12/22/20 11:53  Received From: [place]  Result Received: 05/18/21 10:26   Hemoglobin A1C 4.2 - 5.6 % 7.4 High    Average Blood Glucose (Calc) mg/dL 166   Resulting Agency  [facility] - [facility]  Narrative Performed by [facility] [place] - [facility] Normal Range:    4.2 - 5.6%  Increased Risk:  5.7 - 6.4%  Diabetes:        >= 6.5%  Glycemic Control for adults  with diabetes:  <7%   Specimen Collected: 12/22/20 08:59 Last Resulted: 12/22/20 10:55  Received From: [facility]  Result Received: 05/18/21 10:26   Cholesterol, Total 100 - 200 mg/dL 177   Triglyceride 35 - 199 mg/dL 129   HDL (High Density Lipoprotein) Cholesterol 29.0 - 71.0 mg/dL 64.2   LDL Calculated 0 - 130 mg/dL 87   VLDL Cholesterol mg/dL 26   Cholesterol/HDL Ratio  2.8   [facility] - [facility]  Specimen Collected: 12/22/20 08:59 Last Resulted: 12/22/20 11:53  Received From: [facility]  Result Received: 05/18/21 10:26   Thyroid Stimulating Hormone (TSH) 0.450-5.330 uIU/ml uIU/mL 9.960 High    Resulting Agency  [facility] -  LAB  Specimen Collected: 12/22/20 08:59 Last Resulted: 12/22/20 11:50  Received From: [facility]  Result Received: 05/18/21 10:26   Color Yellow, Violet, Light Violet, Dark Violet Yellow   Clarity Clear Clear   Specific Gravity 1.000 - 1.030 1.020   pH, Urine 5.0 - 8.0 6.0   Protein, Urinalysis Negative, Trace mg/dL Negative   Glucose, Urinalysis Negative mg/dL Negative   Ketones, Urinalysis Negative mg/dL Negative   Blood, Urinalysis Negative Negative   Nitrite, Urinalysis Negative Negative   Leukocyte Esterase, Urinalysis Negative Negative   White Blood Cells, Urinalysis None Seen, 0-3 /hpf None Seen   Red Blood Cells, Urinalysis None Seen, 0-3 /hpf None Seen   Bacteria, Urinalysis None Seen /hpf None Seen   Squamous Epithelial Cells, Urinalysis Rare, Few, None Seen /hpf None Seen   Resulting Agency  [facility] - [facility]  Specimen Collected: 12/22/20 08:59 Last Resulted: 12/22/20 10:35  Received From: [facility] [facility]  Result Received: 05/18/21 10:26  I have reviewed the labs.   Assessment & Plan:    1. BPH with LUTS -PSA stable -DRE benign -UA benign -PVR < 300 cc -moderate bother -symptoms - None -continue finasteride 5 mg daily, dutasteride 0.5 mg daily: script given   2. Prostate Nodule -Unchanged  3. Erectile dysfunction -mild to moderate disease -Continue sildenafil 20 mg, refills given    Return in about 1 year (around 06/15/2022) for IPSS, SHIM, PSA and exam.  Zara Council, [facility] [place] [address], [place], [place] [address] [phone]

## 2021-06-15 ENCOUNTER — Other Ambulatory Visit: Payer: Self-pay

## 2021-06-15 ENCOUNTER — Ambulatory Visit: Payer: Medicare HMO | Admitting: Urology

## 2021-06-15 ENCOUNTER — Encounter: Payer: Self-pay | Admitting: Urology

## 2021-06-15 VITALS — BP 161/70 | HR 61 | Ht 66.0 in | Wt 185.0 lb

## 2021-06-15 DIAGNOSIS — N138 Other obstructive and reflux uropathy: Secondary | ICD-10-CM

## 2021-06-15 DIAGNOSIS — N401 Enlarged prostate with lower urinary tract symptoms: Secondary | ICD-10-CM

## 2021-06-15 DIAGNOSIS — N529 Male erectile dysfunction, unspecified: Secondary | ICD-10-CM | POA: Diagnosis not present

## 2021-06-15 DIAGNOSIS — N402 Nodular prostate without lower urinary tract symptoms: Secondary | ICD-10-CM | POA: Diagnosis not present

## 2021-06-15 MED ORDER — FINASTERIDE 5 MG PO TABS: 5.0000 mg | ORAL_TABLET | Freq: Every day | ORAL | 3 refills | Status: DC

## 2021-07-09 ENCOUNTER — Inpatient Hospital Stay (HOSPITAL_COMMUNITY)
Admission: EM | Admit: 2021-07-09 | Discharge: 2021-07-12 | DRG: 040 | Disposition: A | Discharge status: Home / Self Care | Payer: Medicare HMO | Attending: Neurology | Admitting: Neurology

## 2021-07-09 ENCOUNTER — Emergency Department: Payer: Medicare HMO

## 2021-07-09 ENCOUNTER — Other Ambulatory Visit: Payer: Self-pay

## 2021-07-09 ENCOUNTER — Emergency Department
Admission: EM | Admit: 2021-07-09 | Discharge: 2021-07-09 | Disposition: A | Discharge status: Other Acute Inpatient Hospital | Payer: Medicare HMO | Attending: Emergency Medicine | Admitting: Emergency Medicine

## 2021-07-09 DIAGNOSIS — E785 Hyperlipidemia, unspecified: Secondary | ICD-10-CM | POA: Diagnosis present

## 2021-07-09 DIAGNOSIS — G464 Cerebellar stroke syndrome: Secondary | ICD-10-CM | POA: Diagnosis not present

## 2021-07-09 DIAGNOSIS — Z79899 Other long term (current) drug therapy: Secondary | ICD-10-CM

## 2021-07-09 DIAGNOSIS — E7800 Pure hypercholesterolemia, unspecified: Secondary | ICD-10-CM | POA: Diagnosis not present

## 2021-07-09 DIAGNOSIS — G936 Cerebral edema: Secondary | ICD-10-CM | POA: Insufficient documentation

## 2021-07-09 DIAGNOSIS — M109 Gout, unspecified: Secondary | ICD-10-CM | POA: Diagnosis present

## 2021-07-09 DIAGNOSIS — I614 Nontraumatic intracerebral hemorrhage in cerebellum: Secondary | ICD-10-CM | POA: Insufficient documentation

## 2021-07-09 DIAGNOSIS — E039 Hypothyroidism, unspecified: Secondary | ICD-10-CM | POA: Insufficient documentation

## 2021-07-09 DIAGNOSIS — H53462 Homonymous bilateral field defects, left side: Secondary | ICD-10-CM | POA: Diagnosis present

## 2021-07-09 DIAGNOSIS — I10 Essential (primary) hypertension: Secondary | ICD-10-CM | POA: Diagnosis present

## 2021-07-09 DIAGNOSIS — S4992XA Unspecified injury of left shoulder and upper arm, initial encounter: Secondary | ICD-10-CM | POA: Diagnosis not present

## 2021-07-09 DIAGNOSIS — E119 Type 2 diabetes mellitus without complications: Secondary | ICD-10-CM | POA: Diagnosis not present

## 2021-07-09 DIAGNOSIS — Z794 Long term (current) use of insulin: Secondary | ICD-10-CM | POA: Diagnosis not present

## 2021-07-09 DIAGNOSIS — Z20822 Contact with and (suspected) exposure to covid-19: Secondary | ICD-10-CM | POA: Diagnosis not present

## 2021-07-09 DIAGNOSIS — K219 Gastro-esophageal reflux disease without esophagitis: Secondary | ICD-10-CM | POA: Diagnosis present

## 2021-07-09 DIAGNOSIS — I6389 Other cerebral infarction: Secondary | ICD-10-CM | POA: Diagnosis not present

## 2021-07-09 DIAGNOSIS — Z7982 Long term (current) use of aspirin: Secondary | ICD-10-CM

## 2021-07-09 DIAGNOSIS — I672 Cerebral atherosclerosis: Secondary | ICD-10-CM | POA: Diagnosis not present

## 2021-07-09 DIAGNOSIS — N400 Enlarged prostate without lower urinary tract symptoms: Secondary | ICD-10-CM | POA: Diagnosis present

## 2021-07-09 DIAGNOSIS — W1830XA Fall on same level, unspecified, initial encounter: Secondary | ICD-10-CM | POA: Diagnosis present

## 2021-07-09 DIAGNOSIS — N179 Acute kidney failure, unspecified: Secondary | ICD-10-CM | POA: Diagnosis present

## 2021-07-09 DIAGNOSIS — D696 Thrombocytopenia, unspecified: Secondary | ICD-10-CM | POA: Diagnosis present

## 2021-07-09 DIAGNOSIS — I63433 Cerebral infarction due to embolism of bilateral posterior cerebral arteries: Principal | ICD-10-CM | POA: Diagnosis present

## 2021-07-09 DIAGNOSIS — Z79890 Hormone replacement therapy (postmenopausal): Secondary | ICD-10-CM | POA: Diagnosis not present

## 2021-07-09 DIAGNOSIS — R42 Dizziness and giddiness: Secondary | ICD-10-CM | POA: Insufficient documentation

## 2021-07-09 DIAGNOSIS — E1165 Type 2 diabetes mellitus with hyperglycemia: Secondary | ICD-10-CM | POA: Diagnosis present

## 2021-07-09 DIAGNOSIS — I639 Cerebral infarction, unspecified: Secondary | ICD-10-CM | POA: Diagnosis not present

## 2021-07-09 DIAGNOSIS — R29701 NIHSS score 1: Secondary | ICD-10-CM | POA: Diagnosis present

## 2021-07-09 DIAGNOSIS — D72829 Elevated white blood cell count, unspecified: Secondary | ICD-10-CM | POA: Diagnosis not present

## 2021-07-09 DIAGNOSIS — I619 Nontraumatic intracerebral hemorrhage, unspecified: Secondary | ICD-10-CM | POA: Diagnosis present

## 2021-07-09 DIAGNOSIS — E11649 Type 2 diabetes mellitus with hypoglycemia without coma: Secondary | ICD-10-CM | POA: Diagnosis not present

## 2021-07-09 DIAGNOSIS — R410 Disorientation, unspecified: Secondary | ICD-10-CM | POA: Insufficient documentation

## 2021-07-09 DIAGNOSIS — S06360A Traumatic hemorrhage of cerebrum, unspecified, without loss of consciousness, initial encounter: Secondary | ICD-10-CM | POA: Diagnosis not present

## 2021-07-09 DIAGNOSIS — I161 Hypertensive emergency: Secondary | ICD-10-CM | POA: Diagnosis present

## 2021-07-09 DIAGNOSIS — R29818 Other symptoms and signs involving the nervous system: Secondary | ICD-10-CM | POA: Diagnosis not present

## 2021-07-09 DIAGNOSIS — I6523 Occlusion and stenosis of bilateral carotid arteries: Secondary | ICD-10-CM | POA: Diagnosis not present

## 2021-07-09 DIAGNOSIS — M19012 Primary osteoarthritis, left shoulder: Secondary | ICD-10-CM | POA: Diagnosis not present

## 2021-07-09 HISTORY — DX: Cerebral infarction, unspecified: I63.9

## 2021-07-09 LAB — BASIC METABOLIC PANEL
Anion gap: 9 (ref 5–15)
BUN: 15 mg/dL (ref 8–23)
CO2: 28 mmol/L (ref 22–32)
Calcium: 8.9 mg/dL (ref 8.9–10.3)
Chloride: 100 mmol/L (ref 98–111)
Creatinine, Ser: 1.44 mg/dL — ABNORMAL HIGH (ref 0.61–1.24)
GFR, Estimated: 47 mL/min — ABNORMAL LOW (ref >60)
Glucose, Bld: 223 mg/dL — ABNORMAL HIGH (ref 70–99)
Potassium: 3.8 mmol/L (ref 3.5–5.1)
Sodium: 137 mmol/L (ref 135–145)

## 2021-07-09 LAB — CBC
HCT: 42.5 % (ref 39.0–52.0)
Hemoglobin: 14.5 g/dL (ref 13.0–17.0)
MCH: 32 pg (ref 26.0–34.0)
MCHC: 34.1 g/dL (ref 30.0–36.0)
MCV: 93.8 fL (ref 80.0–100.0)
Platelets: 180 10*3/uL (ref 150–400)
RBC: 4.53 MIL/uL (ref 4.22–5.81)
RDW: 14.2 % (ref 11.5–15.5)
WBC: 12.8 10*3/uL — ABNORMAL HIGH (ref 4.0–10.5)
nRBC: 0 % (ref 0.0–0.2)

## 2021-07-09 LAB — RESP PANEL BY RT-PCR (FLU A&B, COVID) ARPGX2
Influenza A by PCR: NEGATIVE
Influenza B by PCR: NEGATIVE
SARS Coronavirus 2 by RT PCR: NEGATIVE

## 2021-07-09 LAB — TROPONIN I (HIGH SENSITIVITY): Troponin I (High Sensitivity): 21 ng/L — ABNORMAL HIGH (ref <18)

## 2021-07-09 MED ORDER — ACETAMINOPHEN 500 MG PO TABS
1000.0000 mg | ORAL_TABLET | Freq: Once | ORAL | Status: AC
  Administered 2021-07-09: 1000 mg via ORAL
  Filled 2021-07-09: qty 2

## 2021-07-09 MED ORDER — OXYCODONE HCL 5 MG PO TABS
2.5000 mg | ORAL_TABLET | Freq: Once | ORAL | Status: AC
  Administered 2021-07-09: 2.5 mg via ORAL
  Filled 2021-07-09: qty 1

## 2021-07-09 MED ORDER — IOHEXOL 350 MG/ML SOLN: 75.0000 mL | Freq: Once | INTRAVENOUS | Status: DC | PRN

## 2021-07-09 MED ORDER — SODIUM CHLORIDE 0.9 % IV BOLUS
500.0000 mL | Freq: Once | INTRAVENOUS | Status: AC
  Administered 2021-07-09: 500 mL via INTRAVENOUS

## 2021-07-09 MED ORDER — CLEVIDIPINE BUTYRATE 0.5 MG/ML IV EMUL
0.0000 mg/h | INTRAVENOUS | Status: DC
  Administered 2021-07-09: 1 mg/h via INTRAVENOUS
  Filled 2021-07-09: qty 50

## 2021-07-09 MED ORDER — CLEVIDIPINE BUTYRATE 0.5 MG/ML IV EMUL
0.0000 mg/h | INTRAVENOUS | Status: DC
  Administered 2021-07-10: 26 mg/h via INTRAVENOUS
  Administered 2021-07-10: 30 mg/h via INTRAVENOUS
  Administered 2021-07-10: 32 mg/h via INTRAVENOUS
  Administered 2021-07-10: 16 mg/h via INTRAVENOUS
  Administered 2021-07-10 (×2): 32 mg/h via INTRAVENOUS
  Filled 2021-07-09: qty 100
  Filled 2021-07-09 (×2): qty 50
  Filled 2021-07-09 (×2): qty 100
  Filled 2021-07-09 (×2): qty 50

## 2021-07-09 MED ORDER — CLEVIDIPINE BUTYRATE 0.5 MG/ML IV EMUL
INTRAVENOUS | Status: AC
  Administered 2021-07-09: 32 mg/h via INTRAVENOUS
  Filled 2021-07-09: qty 50

## 2021-07-09 NOTE — ED Notes (Addendum)
Reports given to [facility] and [facility] transport team .  Per ERMD Ct request to be done at [facility]

## 2021-07-09 NOTE — ED Provider Notes (Signed)
[facility] EMERGENCY DEPARTMENT Provider Note   CSN: [id number] Arrival date & time: 07/09/21  2133     History No chief complaint on file.   Glen Martinez is a 87 y.o. male.  HPI  87 year old male with past medical history of DM, HTN, HLD presents the emergency department as a transfer from [facility] for concern of CVA with hemorrhagic conversion.  Patient was seen by previous physician over at [facility].  Presented there with confusion and dizziness, last known normal was last night. Please refer to their note for full HPI. MRI imaging was positive for right-sided CVA with edema and some hemorrhagic conversion.  Neurology there spoke with our on-call neurologist who recommended ER to ER transfer for more emergent treatment.  On arrival patient is sitting up, pleasant, comfortable with no complaints.  Denies any active headache.  Vitals are stable.  Past Medical History:  Diagnosis Date   Diabetes mellitus without complication (HCC)    GERD (gastroesophageal reflux disease)    Gout    Hyperlipidemia    Hypertension    Hypothyroidism    Wears dentures    partial upper and lower    Patient Active Problem List   Diagnosis Date Noted   Abnormal ankle brachial index (ABI) 07/03/2019   Benign prostatic hyperplasia 04/09/2018   Hyperlipidemia 04/09/2018   Hypertension 04/09/2018   Hypothyroidism 04/09/2018   Personal history of gout 07/18/2016   Type 2 diabetes mellitus with stage 3 chronic kidney disease, with long-term current use of insulin ([place]) 11/20/2015   ED (erectile dysfunction) of organic origin 08/01/2012   Elevated prostate specific antigen (PSA) 08/01/2012   Nodular prostate with urinary obstruction 08/01/2012   Incomplete emptying of bladder 08/01/2012   Chronic prostatitis 08/01/2012   Disorder of male genital organs 08/01/2012   Urinary tract infection 08/01/2012    Past Surgical History:  Procedure Laterality Date   CATARACT EXTRACTION  W/PHACO Left 05/02/2018   Procedure: CATARACT EXTRACTION PHACO AND INTRAOCULAR LENS PLACEMENT ([place]) DIABETIC;  Surgeon: Leandrew Koyanagi, MD;  Location: [place];  Service: Ophthalmology;  Laterality: Left;  diabetic - insulin   COLONOSCOPY     HERNIA REPAIR     TONSILLECTOMY         No family history on file.  Social History   Tobacco Use   Smoking status: Never   Smokeless tobacco: Never  Vaping Use   Vaping Use: Never used  Substance Use Topics   Alcohol use: Yes    Comment: Holidays    Home Medications Prior to Admission medications   Medication Sig Start Date End Date Taking? Authorizing Provider  allopurinol (ZYLOPRIM) 100 MG tablet Take 100 mg by mouth daily.    [provider]  aspirin 81 MG tablet Take 81 mg by mouth daily.    [provider]  atenolol (TENORMIN) 100 MG tablet Take by mouth. 08/01/12   [provider]  Cyanocobalamin (B-12) 1000 MCG TABS Take 1 tablet by mouth daily. 03/24/20   [provider]  famotidine (PEPCID) 20 MG tablet Take 1 tablet (20 mg total) by mouth daily. 01/01/20 12/31/20  Nance Pear, MD  finasteride (PROSCAR) 5 MG tablet Take 1 tablet (5 mg total) by mouth daily. 06/15/21   Zara Council A, PA-C  gabapentin (NEURONTIN) 300 MG capsule Take 300 mg by mouth at bedtime. 05/04/20   [provider]  hydrochlorothiazide (HYDRODIURIL) 25 MG tablet Take 25 mg by mouth daily.    [provider]  insulin NPH-regular Human (NOVOLIN 70/30) (70-30) 100 UNIT/ML injection Inject into the skin. 28 units at breakfast. 8 units at bedtime.    [provider]  levothyroxine (SYNTHROID, LEVOTHROID) 75 MCG tablet Take 75 mcg by mouth daily before breakfast.    [provider]  lisinopril (PRINIVIL,ZESTRIL) 40 MG tablet Take 40 mg by mouth daily.    [provider]  metoprolol succinate (TOPROL-XL) 100 MG 24 hr tablet Take by mouth. 07/18/18 07/18/19  [provider]  nitroGLYCERIN (NITROSTAT) 0.4 MG SL tablet SMARTSIG:1 Tablet(s) Sublingual PRN 01/01/20   [provider]  omeprazole (PRILOSEC) 20 MG capsule Take 20 mg by mouth daily.    [provider]  sildenafil (REVATIO) 20 MG tablet TAKE 3 TO 5 TABLETS TWO HOURS BEFORE INTERCOURSE ON AN EMPTY STOMACH. DO NOT TAKE WITH NITRATES 11/02/20   Zara Council A, PA-C  simvastatin (ZOCOR) 40 MG tablet Take 40 mg by mouth daily.    [provider]    Allergies    Patient has no known allergies.  Review of Systems   Review of Systems  Constitutional:  Negative for chills and fever.  HENT:  Negative for congestion.   Eyes:  Negative for visual disturbance.  Respiratory:  Negative for shortness of breath.   Cardiovascular:  Negative for chest pain.  Gastrointestinal:  Negative for abdominal pain, diarrhea and vomiting.  Genitourinary:  Negative for dysuria.  Skin:  Negative for rash.  Neurological:  Positive for light-headedness. Negative for facial asymmetry, speech difficulty and headaches.  Psychiatric/Behavioral:  Positive for confusion.    Physical Exam Updated Vital Signs BP (!) 157/68 (BP Location: Right Arm)   Pulse 68   Resp 15   SpO2 96%   Physical Exam Vitals and nursing note reviewed.  Constitutional:      General: He is not in acute distress.    Appearance: Normal appearance.  HENT:     Head: Normocephalic.     Mouth/Throat:     Mouth: Mucous membranes are moist.  Eyes:     Pupils: Pupils are equal, round, and reactive to light.  Cardiovascular:     Rate and Rhythm: Normal rate.  Pulmonary:     Effort: Pulmonary effort is normal. No respiratory distress.  Abdominal:     Palpations: Abdomen is soft.     Tenderness: There is no abdominal tenderness.  Musculoskeletal:     Cervical back: No rigidity.  Skin:    General: Skin is warm.  Neurological:     Mental Status: He is alert and oriented to person, place, and time. Mental status  is at baseline.     Cranial Nerves: No cranial nerve deficit.  Psychiatric:        Mood and Affect: Mood normal.    ED Results / Procedures / Treatments   Labs (all labs ordered are listed, but only abnormal results are displayed) Labs Reviewed - No data to display  EKG None  Radiology CT HEAD WO CONTRAST (5MM)  Result Date: 07/09/2021 CLINICAL DATA:  Head trauma with dizziness. EXAM: CT HEAD WITHOUT CONTRAST TECHNIQUE: Contiguous axial images were obtained from the base of the skull through the vertex without intravenous contrast. COMPARISON:  None. FINDINGS: Brain: Mild low density in the periventricular white matter likely related to small vessel disease. Expected cerebral and cerebellar atrophy for age. Right occipital hypoattenuation, including on 17/2, 61/4, and 31/5. No hemorrhage, hydrocephalus, intra-axial, or extra-axial fluid collection. Vascular: No hyperdense vessel or unexpected  calcification. Skull: No significant soft tissue swelling.  No skull fracture. Sinuses/Orbits: Normal imaged portions of the orbits and globes. Mucosal thickening ethmoid air cells. Clear mastoid air cells. Other: None. IMPRESSION: 1. Hypoattenuation in the right occipital lobe, suspicious for PCA distribution infarct. Correlate with acute/subacute symptoms. Consider further evaluation with pre and post-contrast brain MR. 2. No typical posttraumatic findings identified. 3. Sinus disease. Electronically Signed   By: Abigail Miyamoto M.D.   On: 07/09/2021 14:25   MR BRAIN WO CONTRAST  Result Date: 07/09/2021 CLINICAL DATA:  Brain mass or lesion EXAM: MRI HEAD WITHOUT CONTRAST TECHNIQUE: Multiplanar, multiecho pulse sequences of the brain and surrounding structures were obtained without intravenous contrast. COMPARISON:  Same day CT head. FINDINGS: Brain: Acute infarcts in the right occipital lobe and right hippocampus (right PCA territory). Additional smaller acute infarct in the left occipital cortex. Fairly  extensive susceptibility artifact in the right occipital lobe, compatible with petechial hemorrhage. Associated edema and regional mass effect without midline shift. No hydrocephalus, extra-axial fluid collection, or mass lesion. Additional mild scattered T2 hyperintensities in the white matter, nonspecific but compatible with chronic microvascular ischemic disease. Vascular: Diminished right vertebral artery flow void. Otherwise, major arterial flow voids are maintained at the skull base. Skull and upper cervical spine: Normal marrow signal. Sinuses/Orbits: Mild paranasal sinus mucosal thickening. No acute orbital findings. Other: T2 hyperintense and mildly T1 hyperintense signal within the left petrous apex. No associated restricted diffusion. On same day CT head, this region is opacified without definite cortical destruction/expansion. IMPRESSION: 1. Acute infarcts in the right occipital lobe and right hippocampus (right PCA territory). Additional smaller acute infarct in the left occipital cortex (left PCA territory). Associated edema and regional mass effect without midline shift. Fairly extensive petechial hemorrhage in the right occipital lobe. Given involvement of multiple vascular territories, consider a central embolic etiology. 2. Diminished right vertebral artery flow void. This could be technical; however, given the above findings recommend CTA or MRA to further evaluate for stenosis/occlusion. 3. T2 hyperintense and mildly T1 hyperintense signal within the left petrous apex, favored to reflect trapped fluid when correlating with same day CT head. Cholesterol granuloma is thought less likely. Electronically Signed   By: Margaretha Sheffield M.D.   On: 07/09/2021 18:53   DG Shoulder Left  Result Date: 07/09/2021 CLINICAL DATA:  Initial encounter for trauma and pain. EXAM: LEFT SHOULDER - 2+ VIEW COMPARISON:  None. FINDINGS: No acute fracture or dislocation. Visualized portion of the left hemithorax is  normal. Degenerative changes about the acromioclavicular joint. IMPRESSION: Degenerative change, without acute osseous finding. Electronically Signed   By: Abigail Miyamoto M.D.   On: 07/09/2021 14:13    Procedures Procedures   Medications Ordered in ED Medications - No data to display  ED Course  I have reviewed the triage vital signs and the nursing notes.  Pertinent labs & imaging results that were available during my care of the patient were reviewed by me and considered in my medical decision making (see chart for details).    MDM Rules/Calculators/A&P                           87 year old male presents the emergency department as a transfer from [facility] for CVA with hemorrhagic conversion.  Arrives on Cleviprex infusion.  Dr. Lorrin Goodell, neurology aware of the patient's arrival, accepts the patient for admission.  Neuro exam appears stable, blood pressure is 157/68 on arrival.  Patient has no  complaints.  Patients evaluation and results requires admission for further treatment and care. Patient agrees with admission plan, offers no new complaints and is stable/unchanged at time of admit.  Final Clinical Impression(s) / ED Diagnoses Final diagnoses:  None    Rx / DC Orders ED Discharge Orders     None        Lorelle Gibbs, DO 07/09/21 2233

## 2021-07-09 NOTE — ED Notes (Signed)
RN spoke with pt's daughter, Festus Holts, for update on pt with pt's consent. Pt then also spoke with Festus Holts on the phone.

## 2021-07-09 NOTE — ED Triage Notes (Signed)
Pt here with dizziness. Pt fell last night and hit his head and then his right shoulder. Pt N/VD.

## 2021-07-09 NOTE — H&P (Addendum)
NEUROLOGY CONSULTATION NOTE   Date of service: July 09, 2021 Patient Name: Glen Martinez MRN:  [id number] DOB:  [birth year] Reason for consult: "Hemorrhagic transformation of stroke" Requesting Provider: Lorelle Gibbs, DO _ _ _   _ __   _ __ _ _  __ __   _ __   __ _  History of Present Illness  Glen Martinez is a 87 y.o. male with PMH significant for DM2, GERd, HTN, Hypothyroidism, Gout, who presented to [facility] with dizziness and fall with head trauma but no LOC.   He reports he got up in the morning around 0200 on 07/09/2021 and upon getting up from the bed felt dizzy and fell forward and hit his head on the wall.  He felt fine afterwards so he went to use the bathroom and upon coming back his left shoulder ran into the wall.  The shoulder was hurting really bad and he noted he had couple more episodes of dizziness which she describes as vertigo so he eventually came to the ED.  He had a CTH in the ED which demonstrated R occipital lobe hypodensity concerning for acute/subacute R PCA infarct. No ICH was noted. He subsequently had an MRI a few hours later which demonstrated fairly extensive petechial hemorrhage in the R occipital lobe. He was also noted to have smaller infarct in the L occipital lobe and R hippocampus.  He is on aspirin 81mg  daily, not on anticoagulation.  mRS: 0 tNK/thrombectomy: none, CTH shows completed stroke with now ICH on MRI Brain. LKW: 0200 on 07/09/21. NIHSS components Score: Comment  1a Level of Conscious 0[x]  1[]  2[]  3[]      1b LOC Questions 0[x]  1[]  2[]       1c LOC Commands 0[x]  1[]  2[]       2 Best Gaze 0[x]  1[]  2[]       3 Visual 0[]  1[x]  2[]  3[]      4 Facial Palsy 0[x]  1[]  2[]  3[]      5a Motor Arm - left 0[x]  1[]  2[]  3[]  4[]  UN[]    5b Motor Arm - Right 0[x]  1[]  2[]  3[]  4[]  UN[]    6a Motor Leg - Left 0[x]  1[]  2[]  3[]  4[]  UN[]    6b Motor Leg - Right 0[x]  1[]  2[]  3[]  4[]  UN[]    7 Limb Ataxia 0[x]  1[]  2[]  3[]  UN[]     8 Sensory 0[x]  1[]  2[]  UN[]       9 Best Language 0[x]  1[]  2[]  3[]      10 Dysarthria 0[x]  1[]  2[]  UN[]      11 Extinct. and Inattention 0[x]  1[]  2[]       TOTAL: 1      ROS   Constitutional Denies weight loss, fever and chills.   HEENT Denies changes in vision and hearing.   Respiratory Denies SOB and cough.   CV Denies palpitations and CP   GI Denies abdominal pain, nausea, vomiting and diarrhea.   GU Denies dysuria and urinary frequency.   MSK Denies myalgia and joint pain.   Skin Denies rash and pruritus.   Neurological Denies headache and syncope.   Psychiatric Denies recent changes in mood. Denies anxiety and depression.    Past History   Past Medical History:  Diagnosis Date  . Diabetes mellitus without complication ([place])   . GERD (gastroesophageal reflux disease)   . Gout   . Hyperlipidemia   . Hypertension   . Hypothyroidism   . Wears dentures    partial upper and lower   Past Surgical History:  Procedure Laterality Date  . CATARACT EXTRACTION W/PHACO Left 05/02/2018   Procedure: CATARACT EXTRACTION PHACO AND INTRAOCULAR LENS PLACEMENT ([place]) DIABETIC;  Surgeon: Leandrew Koyanagi, MD;  Location: [place];  Service: Ophthalmology;  Laterality: Left;  diabetic - insulin  . COLONOSCOPY    . HERNIA REPAIR    . TONSILLECTOMY     No family history on file. Social History   Socioeconomic History  . Marital status: Married    Spouse name: Not on file  . Number of children: Not on file  . Years of education: Not on file  . Highest education level: Not on file  Occupational History  . Not on file  Tobacco Use  . Smoking status: Never  . Smokeless tobacco: Never  Vaping Use  . Vaping Use: Never used  Substance and Sexual Activity  . Alcohol use: Yes    Comment: Holidays  . Drug use: Not on file  . Sexual activity: Not on file  Other Topics Concern  . Not on file  Social History Narrative  . Not on file   Social Determinants of Health   Financial Resource Strain: Not on file   Food Insecurity: Not on file  Transportation Needs: Not on file  Physical Activity: Not on file  Stress: Not on file  Social Connections: Not on file   No Known Allergies  Medications  (Not in a hospital admission)    Vitals   Vitals:   07/09/21 2137 07/09/21 2137 07/09/21 2230 07/09/21 2300  BP:  (!) 157/68 (!) 149/78 (!) 145/95  Pulse: 68  74 81  Resp: 15  17 (!) 27  SpO2: 96%  92% 94%     There is no height or weight on file to calculate BMI.  Physical Exam   General: Laying comfortably in bed; in no acute distress.  HENT: Normal oropharynx and mucosa. Normal external appearance of ears and nose.  Neck: Supple, no pain or tenderness  CV: No JVD. No peripheral edema.  Pulmonary: Symmetric Chest rise. Normal respiratory effort.  Abdomen: Soft to touch, non-tender.  Ext: No cyanosis, edema, or deformity  Skin: No rash. Normal palpation of skin.   Musculoskeletal: Normal digits and nails by inspection. No clubbing.   Neurologic Examination  Mental status/Cognition: Alert, oriented to self, place, month and year, good attention.  Speech/language: Fluent, comprehension intact, object naming intact, repetition intact.  Cranial nerves:   CN II Pupils equal and reactive to light, L superior temporal quadrantanopsia.   CN III,IV,VI EOM intact, no gaze preference or deviation, no nystagmus    CN V normal sensation in V1, V2, and V3 segments bilaterally    CN VII no asymmetry, no nasolabial fold flattening    CN VIII normal hearing to speech    CN IX & X normal palatal elevation, no uvular deviation    CN XI 5/5 head turn and 5/5 shoulder shrug bilaterally    CN XII midline tongue protrusion    Motor:  Muscle bulk: normal, tone normal, pronator drift none tremor none Mvmt Root Nerve  Muscle Right Left Comments  SA C5/6 Ax Deltoid 5 5   EF C5/6 Mc Biceps 5 5   EE C6/7/8 Rad Triceps 5 5   WF C6/7 Med FCR     WE C7/8 PIN ECU     F Ab C8/T1 U ADM/FDI 5 5   HF L1/2/3 Fem  Illopsoas 5 5   KE L2/3/4 Fem Quad 5 5   DF L4/5  D Peron Tib Ant 5 5   PF S1/2 Tibial Grc/Sol 5 5    Reflexes:  Right Left Comments  Pectoralis      Biceps (C5/6) 2 2   Brachioradialis (C5/6) 2 2    Triceps (C6/7) 2 2    Patellar (L3/4) 2 2    Achilles (S1)      [place]man      Plantar down down   Jaw jerk    Sensation:  Light touch Intact throughout   Pin prick    Temperature    Vibration   Proprioception    Coordination/Complex Motor:  - Finger to Nose intact BL - Heel to shin intact BL - Rapid alternating movement are normal - Gait: unsafe to assess given his vertigo, vision deficit.  Labs   CBC:  Recent Labs  Lab 07/09/21 1327  WBC 12.8*  HGB 14.5  HCT 42.5  MCV 93.8  PLT 122    Basic Metabolic Panel:  Lab Results  Component Value Date   NA 137 07/09/2021   K 3.8 07/09/2021   CO2 28 07/09/2021   GLUCOSE 223 (H) 07/09/2021   BUN 15 07/09/2021   CREATININE 1.44 (H) 07/09/2021   CALCIUM 8.9 07/09/2021   GFRNONAA 47 (L) 07/09/2021   GFRAA >60 01/01/2020   Lipid Panel: No results found for: LDLCALC HgbA1c: No results found for: HGBA1C Urine Drug Screen: No results found for: LABOPIA, COCAINSCRNUR, LABBENZ, AMPHETMU, THCU, LABBARB  Alcohol Level No results found for: [place]  CT Head without contrast: Personally reviewed CT head without contrast which demonstrates hypodense attenuation in the right occipital lobe suspicious for a right PCA territory infarct.  MRI Brain: Personally reviewed MRI brain without contrast which demonstrates acute infarct in the right occipital lobe and right hippocampus along with an additional small infarct in the left occipital cortex.  He also has fairly extensive petechial hemorrhage in the right occipital lobe which was not noted on the initial CT head without contrast and appears to be new since.  CT angio head and neck without contrast and repeat CT head without contrast is pending.  Impression   Glen Martinez is a 87  y.o. male with PMH significant for DM2, GERd, HTN, Hypothyroidism, Gout, who presented to [facility] with episode of vertigo and fell forward into the wall and another episode where he walked into the wall and hit his left shoulder.  CT head without contrast demonstrates a new right PCA territory stroke.  MRI brain a couple hours later is demonstrates extensive petechial hemorrhage inside the new right occipital stroke.  MRI brain also demonstrates an additional small infarct in the right hippocampus and in the left occipital cortex.  His neurological exam is notable for left superior temporal quadrantanopsia.  With a NIH stroke scale of 1 and an ICH score of 1.  Primary Diagnosis:  Cerebral infarction due to embolism of  bilateral posterior cerebral artery.   Hemorrhagic transformation of stroke.  Secondary Diagnosis: Essential (primary) hypertension and Type 2 diabetes mellitus w/o complications, Cytotoxic edema.  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 - Risk factor modification - Echocardiogram - PT consult, OT consult, Speech consult. - Stroke team to follow   GERD: Continue home PPI.  Benign prostatic hyperplasia: Continue home finasteride.  Hypertension: Will hold home medications at this time and continue Cleviprex and  labetalol.  Hypothyroidism: Continue home Synthroid 75 mcg daily.  Hyperlipidemia: Serum LDL is pending at this time. Will continue home simvastatin 40 mg daily.  ______________________________________________________________________  This patient is critically ill and at significant risk of neurological worsening, death and care requires constant monitoring of vital signs, hemodynamics,respiratory and cardiac monitoring, neurological assessment, discussion with family, other specialists and medical decision making of high complexity. I spent 50 minutes of neurocritical care time  in the care of  this patient. This was time spent independent of any time provided by nurse practitioner or PA.  Donnetta Simpers [facility] Pager Number [phone] 07/10/2021  12:43 AM   Thank you for the opportunity to take part in the care of this patient. If you have any further questions, please contact the neurology consultation attending.  Signed,  [place] Pager Number [phone] _ _ _   _ __   _ __ _ _  __ __   _ __   __ _

## 2021-07-09 NOTE — ED Notes (Signed)
Glen Martinez, daughter, [phone] would like an update when available

## 2021-07-09 NOTE — ED Triage Notes (Signed)
Arrives via [place] from [facility]. Fall yesterday, CT shows stroke in R occipital lobe. Cleviprex infusing at 32mg /hr upon arrival to [facility] ED. BP 129/71. Per [facility], NIH score 1 for slight facial droop.

## 2021-07-09 NOTE — Progress Notes (Signed)
Neurology Telephone Note  I was contacted by Dr. Jari Pigg to discuss this patient who presented to [facility] with complaints of dizziness and a fall (+head trauma, no LOC) last night. He is 88 yo and has pmhx for DM2, HTN, HL. CT head in ED this afternoon showed hypoattenuation in R occipital lobe c/f acute/subacute PCA infarct. No posttraumatic findings were noted and no ICH.   MRI brain without contrast in ED 4 hrs later showed:  1. Acute infarcts in the right occipital lobe and right hippocampus (right PCA territory). Additional smaller acute infarct in the left occipital cortex (left PCA territory). Associated edema and regional mass effect without midline shift. Fairly extensive petechial hemorrhage in the right occipital lobe. Given involvement of multiple vascular territories, consider a central embolic etiology. 2. Diminished right vertebral artery flow void. This could be technical; however, given the above findings recommend CTA or MRA to further evaluate for stenosis/occlusion. 3. T2 hyperintense and mildly T1 hyperintense signal within the left petrous apex, favored to reflect trapped fluid when correlating with same day CT head. Cholesterol granuloma is thought less likely.  CNS imaging personally reviewed, I agree with above interpretation. The petechial hemorrhagic conversion is extensive and apparently occurred within the past few hrs since his CT scan. He will require transfer for higher level of care.  He takes ASA 81mg  daily. He is not on anticoagulation. His platelets are normal. SBP 160s-180s since arrival to ED. Per Dr. Jari Pigg, no gross focal neuro deficits on exam except for vision and subjective dizziness.  Recommendations: - Emergent transfer ED to ED [facility] - Page neurology upon arrival to [facility] ED - Clevidipine for goal SBP <140 - Hold antiplatelets and pharmacologic DVT prophylaxis - Head CT q 6 hrs assess stability of ICH - STAT head CT for any decline in  mental status, change in neurologic exam, nausea/vomiting, or worsening headache - HOB elevated 30 degrees - Further mgmt per Dr. Lorrin Goodell after transfer to [facility]   D/w Dr. Jari Pigg [facility] ED) and Dr. Lorrin Goodell [facility] neurohospitalist) by phone.  Su Monks, MD [facility] [phone]  If 7pm- 7am, please page neurology on call as listed in [place].'

## 2021-07-09 NOTE — ED Provider Notes (Signed)
Emergency Medicine Provider Triage Evaluation Note  Glen Martinez , a 87 y.o. male  was evaluated in triage.  Pt complains of head injury after a fall last night. He recalls getting out of bed to go to the bathroom, when he fell hitting  his head on the wall. He denies LOC, N/V. He also hit his left shoulder on the door jamb. He notes some dizziness, but denies syncope or weakness.   Review of Systems  Positive: Head injury, left shoulder pain  Negative: LOC, N/V  Physical Exam  BP (!) 186/79 (BP Location: Left Arm)   Pulse 71   Temp 98.6 F (37 C) (Oral)   Resp 18   Ht 5\' 6"  (1.676 m)   Wt 83.9 kg   SpO2 97%   BMI 29.86 kg/m  Gen:   Awake, no distress  NAD Resp:  Normal effort CTA MSK:   Moves extremities without difficulty Left shoulder pain  Other:  CVS: RRR  Medical Decision Making  Medically screening exam initiated at 1:33 PM.  Appropriate orders placed.  MADHAV MOHON was informed that the remainder of the evaluation will be completed by another provider, this initial triage assessment does not replace that evaluation, and the importance of remaining in the ED until their evaluation is complete.  Patient with ED evaluation of injuries from a mechanical fall at home.   Melvenia Needles, PA-C 07/09/21 1336    Carrie Mew, MD 07/10/21 2334

## 2021-07-09 NOTE — ED Provider Notes (Addendum)
[facility] Emergency Department Provider Note  ____________________________________________   Event Date/Time   First MD Initiated Contact with Patient 07/09/21 1629     (approximate)  I have reviewed the triage vital signs and the nursing notes.   HISTORY  Chief Complaint Dizziness    HPI Glen Martinez is a 87 y.o. male with diabetes, hypertension, hyperlipidemia who comes in with concerns for dizziness.  Patient fell last night hit his head and then hit his shoulder.  Contrary to the initial triage note it is his left shoulder not his right shoulder.  Patient states that he woke up in the night and he stood up and he felt dizzy.  This was intermittent, led to him having a fall where he hit the right side of his head and his left shoulder.  He states that he did not hit his neck and denies any neck pain.  No chest wall pain, abdominal pain.  He states that he still feels little woozy in his head but denies really feeling dizzy at this time.  He denies any chest pain, shortness of breath, abdominal pain or other symptoms.  Wife is at bedside he give some collateral information he states that patient has not been his normal self since 6 PM on 9/29.  She reports that he was acting more confused than normal.  However now he is acting at his baseline self and he denies any symptom          Past Medical History:  Diagnosis Date   Diabetes mellitus without complication ([place])    GERD (gastroesophageal reflux disease)    Gout    Hyperlipidemia    Hypertension    Hypothyroidism    Wears dentures    partial upper and lower    Patient Active Problem List   Diagnosis Date Noted   Abnormal ankle brachial index (ABI) 07/03/2019   Benign prostatic hyperplasia 04/09/2018   Hyperlipidemia 04/09/2018   Hypertension 04/09/2018   Hypothyroidism 04/09/2018   Personal history of gout 07/18/2016   Type 2 diabetes mellitus with stage 3 chronic kidney disease, with  long-term current use of insulin ([place]) 11/20/2015   ED (erectile dysfunction) of organic origin 08/01/2012   Elevated prostate specific antigen (PSA) 08/01/2012   Nodular prostate with urinary obstruction 08/01/2012   Incomplete emptying of bladder 08/01/2012   Chronic prostatitis 08/01/2012   Disorder of male genital organs 08/01/2012   Urinary tract infection 08/01/2012    Past Surgical History:  Procedure Laterality Date   CATARACT EXTRACTION W/PHACO Left 05/02/2018   Procedure: CATARACT EXTRACTION PHACO AND INTRAOCULAR LENS PLACEMENT ([place]) DIABETIC;  Surgeon: Leandrew Koyanagi, MD;  Location: [place];  Service: Ophthalmology;  Laterality: Left;  diabetic - insulin   COLONOSCOPY     HERNIA REPAIR     TONSILLECTOMY      Prior to Admission medications   Medication Sig Start Date End Date Taking? Authorizing Provider  allopurinol (ZYLOPRIM) 100 MG tablet Take 100 mg by mouth daily.    [provider]  aspirin 81 MG tablet Take 81 mg by mouth daily.    [provider]  atenolol (TENORMIN) 100 MG tablet Take by mouth. 08/01/12   [provider]  Cyanocobalamin (B-12) 1000 MCG TABS Take 1 tablet by mouth daily. 03/24/20   [provider]  famotidine (PEPCID) 20 MG tablet Take 1 tablet (20 mg total) by mouth daily. 01/01/20 12/31/20  Nance Pear, MD  finasteride (PROSCAR) 5 MG  tablet Take 1 tablet (5 mg total) by mouth daily. 06/15/21   Zara Council A, PA-C  gabapentin (NEURONTIN) 300 MG capsule Take 300 mg by mouth at bedtime. 05/04/20   [provider]  hydrochlorothiazide (HYDRODIURIL) 25 MG tablet Take 25 mg by mouth daily.    [provider]  insulin NPH-regular Human (NOVOLIN 70/30) (70-30) 100 UNIT/ML injection Inject into the skin. 28 units at breakfast. 8 units at bedtime.    [provider]  levothyroxine (SYNTHROID, LEVOTHROID) 75 MCG tablet Take 75 mcg by mouth daily before breakfast.    [provider]  lisinopril (PRINIVIL,ZESTRIL) 40 MG tablet Take 40 mg by mouth daily.    [provider]  metoprolol succinate (TOPROL-XL) 100 MG 24 hr tablet Take by mouth. 07/18/18 07/18/19  [provider]  nitroGLYCERIN (NITROSTAT) 0.4 MG SL tablet SMARTSIG:1 Tablet(s) Sublingual PRN 01/01/20   [provider]  omeprazole (PRILOSEC) 20 MG capsule Take 20 mg by mouth daily.    [provider]  sildenafil (REVATIO) 20 MG tablet TAKE 3 TO 5 TABLETS TWO HOURS BEFORE INTERCOURSE ON AN EMPTY STOMACH. DO NOT TAKE WITH NITRATES 11/02/20   Zara Council A, PA-C  simvastatin (ZOCOR) 40 MG tablet Take 40 mg by mouth daily.    [provider]    Allergies Patient has no known allergies.  No family history on file.  Social History Social History   Tobacco Use   Smoking status: Never   Smokeless tobacco: Never  Vaping Use   Vaping Use: Never used  Substance Use Topics   Alcohol use: Yes    Comment: Holidays      Review of Systems Constitutional: No fever/chills Eyes: No visual changes. ENT: No sore throat. Cardiovascular: Denies chest pain. Respiratory: Denies shortness of breath. Gastrointestinal: No abdominal pain.  No nausea, no vomiting.  No diarrhea.  No constipation. Genitourinary: Negative for dysuria. Musculoskeletal: Negative for back pain.  Shoulder pain Skin: Negative for rash. Neurological: Negative for headaches, focal weakness or numbness.  Dizzy All other ROS negative ____________________________________________   PHYSICAL EXAM:  VITAL SIGNS: ED Triage Vitals  Enc Vitals Group     BP 07/09/21 1321 (!) 186/79     Pulse Rate 07/09/21 1321 71     Resp 07/09/21 1321 18     Temp 07/09/21 1321 98.6 F (37 C)     Temp Source 07/09/21 1321 Oral     SpO2 07/09/21 1321 97 %     Weight 07/09/21 1325 185 lb (83.9 kg)     Height 07/09/21 1325 _0  (1.676 m)     Head Circumference --      Peak Flow --      Pain Score  07/09/21 1325 8     Pain Loc --      Pain Edu? --      Excl. in [place]? --     Constitutional: Alert and oriented. Well appearing and in no acute distress. Eyes: Conjunctivae are normal. EOMI. Head: Atraumatic. Nose: No congestion/rhinnorhea. Mouth/Throat: Mucous membranes are moist.   Neck: No stridor. Trachea Midline. FROM.  No C-spine tenderness Cardiovascular: Normal rate, regular rhythm. Grossly normal heart sounds.  Good peripheral circulation. Respiratory: Normal respiratory effort.  No retractions. Lungs CTAB. Gastrointestinal: Soft and nontender. No distention. No abdominal bruits.  Musculoskeletal: No lower extremity tenderness nor edema.  No joint effusions.  Left shoulder pain with trying to move it.  No obvious deformity.  2+ distal pulse.  Sensation  intact. Neurologic:  Normal speech and language. No gross focal neurologic deficits are appreciated.  Cranial nerves are intact.  Equal strength in arms and legs.  Finger-nose intact. Skin:  Skin is warm, dry and intact. No rash noted. Psychiatric: Mood and affect are normal. Speech and behavior are normal. GU: Deferred   ____________________________________________   LABS (all labs ordered are listed, but only abnormal results are displayed)  Labs Reviewed  CBC - Abnormal; Notable for the following components:      Result Value   WBC 12.8 (*)    All other components within normal limits  BASIC METABOLIC PANEL - Abnormal; Notable for the following components:   Glucose, Bld 223 (*)    Creatinine, Ser 1.44 (*)    GFR, Estimated 47 (*)    All other components within normal limits   ____________________________________________   ED ECG REPORT I, Vanessa [place], the attending physician, personally viewed and interpreted this ECG.  Normal sinus rate of 66, no ST elevation, no T wave inversions, normal intervals ____________________________________________  RADIOLOGY   Official radiology report(s): CT HEAD WO CONTRAST  (5MM)  Result Date: 07/09/2021 CLINICAL DATA:  Head trauma with dizziness. EXAM: CT HEAD WITHOUT CONTRAST TECHNIQUE: Contiguous axial images were obtained from the base of the skull through the vertex without intravenous contrast. COMPARISON:  None. FINDINGS: Brain: Mild low density in the periventricular white matter likely related to small vessel disease. Expected cerebral and cerebellar atrophy for age. Right occipital hypoattenuation, including on 17/2, 61/4, and 31/5. No hemorrhage, hydrocephalus, intra-axial, or extra-axial fluid collection. Vascular: No hyperdense vessel or unexpected calcification. Skull: No significant soft tissue swelling.  No skull fracture. Sinuses/Orbits: Normal imaged portions of the orbits and globes. Mucosal thickening ethmoid air cells. Clear mastoid air cells. Other: None. IMPRESSION: 1. Hypoattenuation in the right occipital lobe, suspicious for PCA distribution infarct. Correlate with acute/subacute symptoms. Consider further evaluation with pre and post-contrast brain MR. 2. No typical posttraumatic findings identified. 3. Sinus disease. Electronically Signed   By: Abigail Miyamoto M.D.   On: 07/09/2021 14:25   DG Shoulder Left  Result Date: 07/09/2021 CLINICAL DATA:  Initial encounter for trauma and pain. EXAM: LEFT SHOULDER - 2+ VIEW COMPARISON:  None. FINDINGS: No acute fracture or dislocation. Visualized portion of the left hemithorax is normal. Degenerative changes about the acromioclavicular joint. IMPRESSION: Degenerative change, without acute osseous finding. Electronically Signed   By: Abigail Miyamoto M.D.   On: 07/09/2021 14:13    ____________________________________________   PROCEDURES  Procedure(s) performed (including Critical Care):  .1-3 Lead EKG Interpretation Performed by: Vanessa [place], MD Authorized by: Vanessa [place], MD     Interpretation: normal     ECG rate:  80s   ECG rate assessment: normal     Rhythm: sinus rhythm     Ectopy: none      Conduction: normal   .Critical Care Performed by: Vanessa [place], MD Authorized by: Vanessa , MD   Critical care provider statement:    Critical care time (minutes):  45   Critical care was necessary to treat or prevent imminent or life-threatening deterioration of the following conditions:  CNS failure or compromise   Critical care was time spent personally by me on the following activities:  Discussions with consultants, evaluation of patient's response to treatment, examination of patient, ordering and performing treatments and interventions, ordering and review of laboratory studies, ordering and review of radiographic studies, pulse oximetry, re-evaluation of patient's condition, obtaining  history from patient or surrogate and review of old charts   ____________________________________________   INITIAL IMPRESSION / ASSESSMENT AND PLAN / ED COURSE  WILBUR OAKLAND was evaluated in Emergency Department on 07/09/2021 for the symptoms described in the history of present illness. He was evaluated in the context of the global COVID-19 pandemic, which necessitated consideration that the patient might be at risk for infection with the SARS-CoV-2 virus that causes COVID-19. Institutional protocols and algorithms that pertain to the evaluation of patients at risk for COVID-19 are in a state of rapid change based on information released by regulatory bodies including the CDC and federal and state organizations. These policies and algorithms were followed during the patient's care in the ED.    Patient comes in with a fall.  CT head ordered from triage with concern for potential stroke.  Discussed with the radiologist who recommended with and without contrast to make sure there is no brain met.  Given the concern the patient was acting different from his normal self yesterday I do feel like we need to proceed with the MRI.  CT cervical was not ordered in triage for patient denies hitting his spine,  denies any neck tenderness, has full range of motion of his neck without any pain and has no numbness or tingling in his fingers therefore his c spine is cleared.  Labs ordered to evaluate for Electra abnormalities, AKI.  X-ray to evaluate for left shoulder fracture  To note patient not in the window for tPA given last known normal was around 6 PM on 07/08/2021 and stroke code was not called given symptoms of been going on for 22 hours and NIH stroke scale was 0.  Kidney function slightly elevated 1.44.  His white count slightly elevated at 12.8 but no evidence of infections he is afebrile and otherwise well-appearing.  MRI concerning for potential stroke.  Recommended MRI.  X-ray of the shoulder was negative.  MRI concerning with multiple strokes as well as edema and hemorrhagic conversion  7:47 PM  d/w Dr. Quinn Axe about abnormal MRI- HOB 30 degrees, clevidipine, and transfer to [facility] TO ED for neuro ICU consultation.  Dr.    Leonia Corona PM Pt accepted to [facility] ED to ED by [place] Atlas Dr. Lorrin Goodell has been notified as well from the neuro ICU at [facility] and will evaluate pt in ED.         ____________________________________________   FINAL CLINICAL IMPRESSION(S) / ED DIAGNOSES   Final diagnoses:  Cerebellar stroke ([place])  Brain edema ([place])  Hemorrhagic cerebellum ([place])      MEDICATIONS GIVEN DURING THIS VISIT:  Medications  clevidipine (CLEVIPREX) infusion 0.5 mg/mL (has no administration in time range)  sodium chloride 0.9 % bolus 500 mL (0 mLs Intravenous Stopped 07/09/21 1819)  oxyCODONE (Oxy IR/ROXICODONE) immediate release tablet 2.5 mg (2.5 mg Oral Given 07/09/21 1717)  acetaminophen (TYLENOL) tablet 1,000 mg (1,000 mg Oral Given 07/09/21 1717)     ED Discharge Orders     None        Note:  This document was prepared using Dragon voice recognition software and may include unintentional dictation errors.    Vanessa [place], MD 07/09/21 2007    Vanessa [place],  MD 07/09/21 Thom Chimes

## 2021-07-10 ENCOUNTER — Inpatient Hospital Stay (HOSPITAL_COMMUNITY): Payer: Medicare HMO

## 2021-07-10 DIAGNOSIS — I63433 Cerebral infarction due to embolism of bilateral posterior cerebral arteries: Secondary | ICD-10-CM | POA: Diagnosis present

## 2021-07-10 DIAGNOSIS — I6523 Occlusion and stenosis of bilateral carotid arteries: Secondary | ICD-10-CM | POA: Diagnosis not present

## 2021-07-10 DIAGNOSIS — I161 Hypertensive emergency: Secondary | ICD-10-CM

## 2021-07-10 DIAGNOSIS — K219 Gastro-esophageal reflux disease without esophagitis: Secondary | ICD-10-CM | POA: Diagnosis present

## 2021-07-10 DIAGNOSIS — I6389 Other cerebral infarction: Secondary | ICD-10-CM | POA: Diagnosis not present

## 2021-07-10 DIAGNOSIS — E785 Hyperlipidemia, unspecified: Secondary | ICD-10-CM | POA: Diagnosis present

## 2021-07-10 DIAGNOSIS — N400 Enlarged prostate without lower urinary tract symptoms: Secondary | ICD-10-CM | POA: Diagnosis present

## 2021-07-10 DIAGNOSIS — Z79890 Hormone replacement therapy (postmenopausal): Secondary | ICD-10-CM | POA: Diagnosis not present

## 2021-07-10 DIAGNOSIS — E1165 Type 2 diabetes mellitus with hyperglycemia: Secondary | ICD-10-CM | POA: Diagnosis present

## 2021-07-10 DIAGNOSIS — H53462 Homonymous bilateral field defects, left side: Secondary | ICD-10-CM | POA: Diagnosis present

## 2021-07-10 DIAGNOSIS — E11649 Type 2 diabetes mellitus with hypoglycemia without coma: Secondary | ICD-10-CM | POA: Diagnosis not present

## 2021-07-10 DIAGNOSIS — R29701 NIHSS score 1: Secondary | ICD-10-CM | POA: Diagnosis present

## 2021-07-10 DIAGNOSIS — I639 Cerebral infarction, unspecified: Secondary | ICD-10-CM

## 2021-07-10 DIAGNOSIS — N179 Acute kidney failure, unspecified: Secondary | ICD-10-CM | POA: Diagnosis present

## 2021-07-10 DIAGNOSIS — I672 Cerebral atherosclerosis: Secondary | ICD-10-CM | POA: Diagnosis not present

## 2021-07-10 DIAGNOSIS — I10 Essential (primary) hypertension: Secondary | ICD-10-CM | POA: Diagnosis present

## 2021-07-10 DIAGNOSIS — R29818 Other symptoms and signs involving the nervous system: Secondary | ICD-10-CM | POA: Diagnosis not present

## 2021-07-10 DIAGNOSIS — D696 Thrombocytopenia, unspecified: Secondary | ICD-10-CM | POA: Diagnosis present

## 2021-07-10 DIAGNOSIS — W1830XA Fall on same level, unspecified, initial encounter: Secondary | ICD-10-CM | POA: Diagnosis present

## 2021-07-10 DIAGNOSIS — D72829 Elevated white blood cell count, unspecified: Secondary | ICD-10-CM | POA: Diagnosis not present

## 2021-07-10 DIAGNOSIS — E039 Hypothyroidism, unspecified: Secondary | ICD-10-CM | POA: Diagnosis present

## 2021-07-10 DIAGNOSIS — Z794 Long term (current) use of insulin: Secondary | ICD-10-CM | POA: Diagnosis not present

## 2021-07-10 DIAGNOSIS — M109 Gout, unspecified: Secondary | ICD-10-CM | POA: Diagnosis present

## 2021-07-10 DIAGNOSIS — I619 Nontraumatic intracerebral hemorrhage, unspecified: Secondary | ICD-10-CM | POA: Diagnosis present

## 2021-07-10 DIAGNOSIS — E7800 Pure hypercholesterolemia, unspecified: Secondary | ICD-10-CM

## 2021-07-10 DIAGNOSIS — Z79899 Other long term (current) drug therapy: Secondary | ICD-10-CM | POA: Diagnosis not present

## 2021-07-10 DIAGNOSIS — G936 Cerebral edema: Secondary | ICD-10-CM | POA: Diagnosis present

## 2021-07-10 DIAGNOSIS — Z7982 Long term (current) use of aspirin: Secondary | ICD-10-CM | POA: Diagnosis not present

## 2021-07-10 LAB — LIPID PANEL
Cholesterol: 168 mg/dL (ref 0–200)
HDL: 60 mg/dL (ref >40)
LDL Cholesterol: 42 mg/dL (ref 0–99)
Total CHOL/HDL Ratio: 2.8 RATIO
Triglycerides: 332 mg/dL — ABNORMAL HIGH (ref <150)
VLDL: 66 mg/dL — ABNORMAL HIGH (ref 0–40)

## 2021-07-10 LAB — GLUCOSE, CAPILLARY
Glucose-Capillary: 191 mg/dL — ABNORMAL HIGH (ref 70–99)
Glucose-Capillary: 281 mg/dL — ABNORMAL HIGH (ref 70–99)
Glucose-Capillary: 180 mg/dL — ABNORMAL HIGH (ref 70–99)
Glucose-Capillary: 163 mg/dL — ABNORMAL HIGH (ref 70–99)

## 2021-07-10 LAB — RAPID URINE DRUG SCREEN, HOSP PERFORMED
Amphetamines: NOT DETECTED
Barbiturates: NOT DETECTED
Benzodiazepines: NOT DETECTED
Cocaine: NOT DETECTED
Opiates: NOT DETECTED
Tetrahydrocannabinol: NOT DETECTED

## 2021-07-10 LAB — HEMOGLOBIN A1C
Hgb A1c MFr Bld: 7.2 % — ABNORMAL HIGH (ref 4.8–5.6)
Mean Plasma Glucose: 159.94 mg/dL

## 2021-07-10 LAB — ECHOCARDIOGRAM COMPLETE
AR max vel: 3.28 cm2
AV Area VTI: 3.09 cm2
AV Area mean vel: 3.15 cm2
AV Mean grad: 2 mmHg
AV Peak grad: 4.6 mmHg
Ao pk vel: 1.07 m/s
Area-P 1/2: 2.64 cm2
Height: 66 in
S' Lateral: 3.1 cm
Weight: 2960 oz

## 2021-07-10 LAB — PROTIME-INR
INR: 1.1 (ref 0.8–1.2)
Prothrombin Time: 13.8 seconds (ref 11.4–15.2)

## 2021-07-10 LAB — APTT: aPTT: 29 seconds (ref 24–36)

## 2021-07-10 LAB — CBG MONITORING, ED: Glucose-Capillary: 141 mg/dL — ABNORMAL HIGH (ref 70–99)

## 2021-07-10 LAB — MRSA NEXT GEN BY PCR, NASAL: MRSA by PCR Next Gen: NOT DETECTED

## 2021-07-10 MED ORDER — LISINOPRIL 20 MG PO TABS
40.0000 mg | ORAL_TABLET | Freq: Every day | ORAL | Status: DC
  Administered 2021-07-10 – 2021-07-12 (×3): 40 mg via ORAL
  Filled 2021-07-10 (×3): qty 2

## 2021-07-10 MED ORDER — METOPROLOL SUCCINATE ER 50 MG PO TB24
100.0000 mg | ORAL_TABLET | Freq: Every day | ORAL | Status: DC
  Administered 2021-07-10 – 2021-07-12 (×3): 100 mg via ORAL
  Filled 2021-07-10 (×3): qty 2

## 2021-07-10 MED ORDER — PANTOPRAZOLE SODIUM 40 MG PO TBEC
40.0000 mg | DELAYED_RELEASE_TABLET | Freq: Every day | ORAL | Status: DC
  Administered 2021-07-10 – 2021-07-12 (×3): 40 mg via ORAL
  Filled 2021-07-10 (×5): qty 1

## 2021-07-10 MED ORDER — LEVOTHYROXINE SODIUM 75 MCG PO TABS
75.0000 ug | ORAL_TABLET | Freq: Every day | ORAL | Status: DC
  Administered 2021-07-10 – 2021-07-12 (×3): 75 ug via ORAL
  Filled 2021-07-10 (×3): qty 1

## 2021-07-10 MED ORDER — INSULIN ASPART 100 UNIT/ML IJ SOLN: 0.0000 [IU] | Freq: Every day | INTRAMUSCULAR | Status: DC

## 2021-07-10 MED ORDER — ACETAMINOPHEN 325 MG PO TABS
650.0000 mg | ORAL_TABLET | ORAL | Status: DC | PRN
  Administered 2021-07-10 – 2021-07-12 (×3): 650 mg via ORAL
  Filled 2021-07-10 (×3): qty 2

## 2021-07-10 MED ORDER — INSULIN ASPART PROT & ASPART (70-30 MIX) 100 UNIT/ML ~~LOC~~ SUSP
10.0000 [IU] | Freq: Two times a day (BID) | SUBCUTANEOUS | Status: DC
  Administered 2021-07-10 – 2021-07-12 (×4): 10 [IU] via SUBCUTANEOUS
  Filled 2021-07-10 (×2): qty 10

## 2021-07-10 MED ORDER — SENNOSIDES-DOCUSATE SODIUM 8.6-50 MG PO TABS
1.0000 | ORAL_TABLET | Freq: Two times a day (BID) | ORAL | Status: DC
  Administered 2021-07-10 – 2021-07-12 (×4): 1 via ORAL
  Filled 2021-07-10 (×4): qty 1

## 2021-07-10 MED ORDER — IOHEXOL 350 MG/ML SOLN
75.0000 mL | Freq: Once | INTRAVENOUS | Status: AC | PRN
  Administered 2021-07-10: 75 mL via INTRAVENOUS

## 2021-07-10 MED ORDER — VITAMIN B-12 1000 MCG PO TABS
1000.0000 ug | ORAL_TABLET | Freq: Every day | ORAL | Status: DC
  Administered 2021-07-10 – 2021-07-12 (×3): 1000 ug via ORAL
  Filled 2021-07-10 (×3): qty 1

## 2021-07-10 MED ORDER — SIMVASTATIN 20 MG PO TABS
40.0000 mg | ORAL_TABLET | Freq: Every day | ORAL | Status: DC
  Administered 2021-07-10 – 2021-07-11 (×3): 40 mg via ORAL
  Filled 2021-07-10 (×3): qty 2

## 2021-07-10 MED ORDER — ASPIRIN EC 81 MG PO TBEC
81.0000 mg | DELAYED_RELEASE_TABLET | Freq: Every day | ORAL | Status: DC
  Administered 2021-07-10 – 2021-07-12 (×3): 81 mg via ORAL
  Filled 2021-07-10 (×3): qty 1

## 2021-07-10 MED ORDER — ACETAMINOPHEN 160 MG/5ML PO SOLN: 650.0000 mg | ORAL | Status: DC | PRN

## 2021-07-10 MED ORDER — PANTOPRAZOLE SODIUM 40 MG IV SOLR: 40.0000 mg | Freq: Every day | INTRAVENOUS | Status: DC

## 2021-07-10 MED ORDER — INSULIN ASPART 100 UNIT/ML IJ SOLN
0.0000 [IU] | Freq: Three times a day (TID) | INTRAMUSCULAR | Status: DC
  Administered 2021-07-10: 8 [IU] via SUBCUTANEOUS
  Administered 2021-07-10 (×2): 3 [IU] via SUBCUTANEOUS
  Administered 2021-07-11: 5 [IU] via SUBCUTANEOUS
  Administered 2021-07-11 – 2021-07-12 (×3): 3 [IU] via SUBCUTANEOUS
  Administered 2021-07-12: 2 [IU] via SUBCUTANEOUS

## 2021-07-10 MED ORDER — STROKE: EARLY STAGES OF RECOVERY BOOK
Freq: Once | Status: AC
  Filled 2021-07-10: qty 1

## 2021-07-10 MED ORDER — ALLOPURINOL 100 MG PO TABS
100.0000 mg | ORAL_TABLET | Freq: Every day | ORAL | Status: DC
  Administered 2021-07-10 – 2021-07-12 (×3): 100 mg via ORAL
  Filled 2021-07-10 (×3): qty 1

## 2021-07-10 MED ORDER — CHLORHEXIDINE GLUCONATE CLOTH 2 % EX PADS
6.0000 | MEDICATED_PAD | Freq: Every day | CUTANEOUS | Status: DC
  Administered 2021-07-10 – 2021-07-11 (×2): 6 via TOPICAL

## 2021-07-10 MED ORDER — GABAPENTIN 300 MG PO CAPS
300.0000 mg | ORAL_CAPSULE | Freq: Every day | ORAL | Status: DC
  Administered 2021-07-10 – 2021-07-11 (×3): 300 mg via ORAL
  Filled 2021-07-10 (×3): qty 1

## 2021-07-10 MED ORDER — LABETALOL HCL 5 MG/ML IV SOLN: 10.0000 mg | INTRAVENOUS | Status: DC | PRN

## 2021-07-10 MED ORDER — ACETAMINOPHEN 650 MG RE SUPP: 650.0000 mg | RECTAL | Status: DC | PRN

## 2021-07-10 MED ORDER — FINASTERIDE 5 MG PO TABS
5.0000 mg | ORAL_TABLET | Freq: Every day | ORAL | Status: DC
  Administered 2021-07-10 – 2021-07-12 (×3): 5 mg via ORAL
  Filled 2021-07-10 (×3): qty 1

## 2021-07-10 NOTE — Progress Notes (Addendum)
STROKE TEAM PROGRESS NOTE   SUBJECTIVE (INTERVAL HISTORY) No family is at the bedside.  Patient sitting in chair, no complaints, off Cleviprex.  Denies any headache.  Still has left upper quadrantanopsia.  PT/OT pending.  OBJECTIVE Temp:  [98.3 F (36.8 C)-99.9 F (37.7 C)] 99.2 F (37.3 C) (10/01 1100) Pulse Rate:  [68-99] 70 (10/01 1100) Resp:  [12-27] 12 (10/01 1100) BP: (95-196)/(46-95) 110/59 (10/01 1100) SpO2:  [88 %-98 %] 93 % (10/01 1100) Weight:  [83.9 kg] 83.9 kg (09/30 1325)  Recent Labs  Lab 07/10/21 0120 07/10/21 0801  GLUCAP 141* 191*   Recent Labs  Lab 07/09/21 1327  NA 137  K 3.8  CL 100  CO2 28  GLUCOSE 223*  BUN 15  CREATININE 1.44*  CALCIUM 8.9   No results for input(s): AST, ALT, ALKPHOS, BILITOT, PROT, ALBUMIN in the last 168 hours. Recent Labs  Lab 07/09/21 1327  WBC 12.8*  HGB 14.5  HCT 42.5  MCV 93.8  PLT 180   No results for input(s): CKTOTAL, CKMB, CKMBINDEX, TROPONINI in the last 168 hours. Recent Labs    07/10/21 0015  LABPROT 13.8  INR 1.1   No results for input(s): COLORURINE, LABSPEC, PHURINE, GLUCOSEU, HGBUR, BILIRUBINUR, KETONESUR, PROTEINUR, UROBILINOGEN, NITRITE, LEUKOCYTESUR in the last 72 hours.  Invalid input(s): APPERANCEUR     Component Value Date/Time   CHOL 168 07/10/2021 0015   TRIG 332 (H) 07/10/2021 0015   HDL 60 07/10/2021 0015   CHOLHDL 2.8 07/10/2021 0015   VLDL 66 (H) 07/10/2021 0015   LDLCALC 42 07/10/2021 0015   Lab Results  Component Value Date   HGBA1C 7.2 (H) 07/10/2021   No results found for: LABOPIA, COCAINSCRNUR, LABBENZ, AMPHETMU, THCU, LABBARB  No results for input(s): ETH in the last 168 hours.  I have personally reviewed the radiological images below and agree with the radiology interpretations.  CT ANGIO HEAD NECK W WO CM  Result Date: 07/10/2021 CLINICAL DATA:  Acute neurologic deficit EXAM: CT ANGIOGRAPHY HEAD AND NECK TECHNIQUE: Multidetector CT imaging of the head and neck was  performed using the standard protocol during bolus administration of intravenous contrast. Multiplanar CT image reconstructions and MIPs were obtained to evaluate the vascular anatomy. Carotid stenosis measurements (when applicable) are obtained utilizing NASCET criteria, using the distal internal carotid diameter as the denominator. CONTRAST:  82mL OMNIPAQUE IOHEXOL 350 MG/ML SOLN COMPARISON:  07/09/2021 head CT and brain MRI FINDINGS: CTA NECK FINDINGS SKELETON: There is no bony spinal canal stenosis. No lytic or blastic lesion. OTHER NECK: Normal pharynx, larynx and major salivary glands. No cervical lymphadenopathy. Unremarkable thyroid gland. UPPER CHEST: No pneumothorax or pleural effusion. No nodules or masses. AORTIC ARCH: There is calcific atherosclerosis of the aortic arch. There is no aneurysm, dissection or hemodynamically significant stenosis of the visualized portion of the aorta. Conventional 3 vessel aortic branching pattern. The visualized proximal subclavian arteries are widely patent. RIGHT CAROTID SYSTEM: Normal without aneurysm, dissection or stenosis. LEFT CAROTID SYSTEM: Normal without aneurysm, dissection or stenosis. VERTEBRAL ARTERIES: Left dominant configuration. Both origins are clearly patent. There is no dissection, occlusion or flow-limiting stenosis to the skull base (V1-V3 segments). CTA HEAD FINDINGS POSTERIOR CIRCULATION: --Vertebral arteries: Normal V4 segments. --Inferior cerebellar arteries: Normal. --Basilar artery: Normal. --Superior cerebellar arteries: Normal. --Posterior cerebral arteries (PCA): Right PCA may be occluded at the distal right P3 segment. Left PCA is patent. ANTERIOR CIRCULATION: --Intracranial internal carotid arteries: Atherosclerotic calcification of the internal carotid arteries at the skull base without  hemodynamically significant stenosis. --Anterior cerebral arteries (ACA): Normal. Both A1 segments are present. Patent anterior communicating artery  (a-comm). --Middle cerebral arteries (MCA): Normal. VENOUS SINUSES: As permitted by contrast timing, patent. ANATOMIC VARIANTS: None Review of the MIP images confirms the above findings. IMPRESSION: 1. No emergent large vessel occlusion or high-grade stenosis of the intracranial arteries. 2. Possible occlusion of the distal right P3 segment. Aortic Atherosclerosis (ICD10-I70.0). Electronically Signed   By: Ulyses Jarred M.D.   On: 07/10/2021 01:09   CT HEAD WO CONTRAST (5MM)  Result Date: 07/09/2021 CLINICAL DATA:  Head trauma with dizziness. EXAM: CT HEAD WITHOUT CONTRAST TECHNIQUE: Contiguous axial images were obtained from the base of the skull through the vertex without intravenous contrast. COMPARISON:  None. FINDINGS: Brain: Mild low density in the periventricular white matter likely related to small vessel disease. Expected cerebral and cerebellar atrophy for age. Right occipital hypoattenuation, including on 17/2, 61/4, and 31/5. No hemorrhage, hydrocephalus, intra-axial, or extra-axial fluid collection. Vascular: No hyperdense vessel or unexpected calcification. Skull: No significant soft tissue swelling.  No skull fracture. Sinuses/Orbits: Normal imaged portions of the orbits and globes. Mucosal thickening ethmoid air cells. Clear mastoid air cells. Other: None. IMPRESSION: 1. Hypoattenuation in the right occipital lobe, suspicious for PCA distribution infarct. Correlate with acute/subacute symptoms. Consider further evaluation with pre and post-contrast brain MR. 2. No typical posttraumatic findings identified. 3. Sinus disease. Electronically Signed   By: Abigail Miyamoto M.D.   On: 07/09/2021 14:25   MR BRAIN WO CONTRAST  Result Date: 07/09/2021 CLINICAL DATA:  Brain mass or lesion EXAM: MRI HEAD WITHOUT CONTRAST TECHNIQUE: Multiplanar, multiecho pulse sequences of the brain and surrounding structures were obtained without intravenous contrast. COMPARISON:  Same day CT head. FINDINGS: Brain: Acute  infarcts in the right occipital lobe and right hippocampus (right PCA territory). Additional smaller acute infarct in the left occipital cortex. Fairly extensive susceptibility artifact in the right occipital lobe, compatible with petechial hemorrhage. Associated edema and regional mass effect without midline shift. No hydrocephalus, extra-axial fluid collection, or mass lesion. Additional mild scattered T2 hyperintensities in the white matter, nonspecific but compatible with chronic microvascular ischemic disease. Vascular: Diminished right vertebral artery flow void. Otherwise, major arterial flow voids are maintained at the skull base. Skull and upper cervical spine: Normal marrow signal. Sinuses/Orbits: Mild paranasal sinus mucosal thickening. No acute orbital findings. Other: T2 hyperintense and mildly T1 hyperintense signal within the left petrous apex. No associated restricted diffusion. On same day CT head, this region is opacified without definite cortical destruction/expansion. IMPRESSION: 1. Acute infarcts in the right occipital lobe and right hippocampus (right PCA territory). Additional smaller acute infarct in the left occipital cortex (left PCA territory). Associated edema and regional mass effect without midline shift. Fairly extensive petechial hemorrhage in the right occipital lobe. Given involvement of multiple vascular territories, consider a central embolic etiology. 2. Diminished right vertebral artery flow void. This could be technical; however, given the above findings recommend CTA or MRA to further evaluate for stenosis/occlusion. 3. T2 hyperintense and mildly T1 hyperintense signal within the left petrous apex, favored to reflect trapped fluid when correlating with same day CT head. Cholesterol granuloma is thought less likely. Electronically Signed   By: Margaretha Sheffield M.D.   On: 07/09/2021 18:53   DG Shoulder Left  Result Date: 07/09/2021 CLINICAL DATA:  Initial encounter for  trauma and pain. EXAM: LEFT SHOULDER - 2+ VIEW COMPARISON:  None. FINDINGS: No acute fracture or dislocation. Visualized portion of the  left hemithorax is normal. Degenerative changes about the acromioclavicular joint. IMPRESSION: Degenerative change, without acute osseous finding. Electronically Signed   By: Abigail Miyamoto M.D.   On: 07/09/2021 14:13     PHYSICAL EXAM  Temp:  [98.3 F (36.8 C)-99.9 F (37.7 C)] 99.2 F (37.3 C) (10/01 1100) Pulse Rate:  [68-99] 70 (10/01 1100) Resp:  [12-27] 12 (10/01 1100) BP: (95-196)/(46-95) 110/59 (10/01 1100) SpO2:  [88 %-98 %] 93 % (10/01 1100) Weight:  [83.9 kg] 83.9 kg (09/30 1325)  General - Well nourished, well developed, in no apparent distress.  Ophthalmologic - fundi not visualized due to noncooperation.  Cardiovascular - Regular rhythm and rate.  Mental Status -  Level of arousal and orientation to time, place, and person were intact.  Language including expression, naming, repetition, comprehension was assessed and found intact. Fund of Knowledge was assessed and was intact.  Cranial Nerves II - XII - II - Visual field intact OU, left upper quadrantanopsia III, IV, VI - Extraocular movements intact. V - Facial sensation intact bilaterally. VII - Facial movement intact bilaterally. VIII - Hearing & vestibular intact bilaterally. X - Palate elevates symmetrically. XI - Chin turning & shoulder shrug intact bilaterally. XII - Tongue protrusion intact.  Motor Strength - The patient's strength was normal in all extremities and pronator drift was absent.  Bulk was normal and fasciculations were absent.   Motor Tone - Muscle tone was assessed at the neck and appendages and was normal.  Reflexes - The patient's reflexes were symmetrical in all extremities and he had no pathological reflexes.  Sensory - Light touch, temperature/pinprick were assessed and were symmetrical.    Coordination - The patient had normal movements in the hands  with no ataxia or dysmetria.  Tremor was absent.  Gait and Station - deferred.   ASSESSMENT/PLAN Mr. Glen Martinez is a 87 y.o. male with history of diabetes, hypertension admitted for dizziness, fall hitting head and vision changes hitting left shoulder on the door. No tPA given due to subacute stroke with petechial hemorrhage.    Stroke:  bilateral R>L PCA infarcts with right P3 occlusion and petechial hemorrhage (HI2), embolic secondary to unknown source Resultant left upper quadrant simultanagnosia MRI large right PCA infarct with confluent petechial hemorrhage, left PCA small infarct CTA head and neck right P3 possible occlusion 2D Echo EF 65 to 70% LE venous Doppler no DVT Consider loop recorder tomorrow before discharge LDL 42 HgbA1c 7.2 SCDs for VTE prophylaxis aspirin 81 mg daily prior to admission, now on aspirin 81 mg daily.  Continue on discharge Patient counseled to be compliant with his antithrombotic medications Ongoing aggressive stroke risk factor management Therapy recommendations: Pending Disposition: Pending  Diabetes HgbA1c 7.2 goal < 7.0 Uncontrolled CBG monitoring SSI close PCP follow up for better DM control  Hypertensive emergency Stable now Off Cleviprex Resume home metoprolol and lisinopril BP goal less than 160 Long term BP goal normotensive  Hyperlipidemia Home meds: Zocor 40 LDL 42, goal < 70 Now on Zocor 40 Continue statin at discharge  Other Stroke Risk Factors Advanced age  Other Active Problems AKI, Cre 1.44->1.61, gentle IVF @ 50 Leukocytosis WBC 12.8->11.6  Hospital day # 0  This patient is critically ill due to bilateral PCA infarcts with petechial hemorrhage, hypertensive emergency, AKI, leukocytosis and at significant risk of neurological worsening, death form recurrent stroke, hemorrhagic conversion, hypertensive encephalopathy, seizure, renal failure, sepsis. This patient's care requires constant monitoring of vital  signs, hemodynamics, respiratory and  cardiac monitoring, review of multiple databases, neurological assessment, discussion with family, other specialists and medical decision making of high complexity. I spent 40 minutes of neurocritical care time in the care of this patient.   Rosalin Hawking, MD PhD Stroke Neurology 07/10/2021 12:34 PM    To contact Stroke Continuity provider, please refer to [email or web address] After hours, contact General Neurology

## 2021-07-10 NOTE — Progress Notes (Signed)
  Echocardiogram 2D Echocardiogram has been performed.  Glen Martinez 07/10/2021, 3:17 PM

## 2021-07-10 NOTE — Progress Notes (Signed)
OT Cancellation Note  Patient Details Name: Glen Martinez MRN: [id number] DOB: [birth year]   Cancelled Treatment:    Reason Eval/Treat Not Completed: Active bedrest order.    [name] M 07/10/2021, 8:07 AM

## 2021-07-10 NOTE — Progress Notes (Signed)
VASCULAR LAB    Bilateral lower extremity venous duplex has been performed.  See CV proc for preliminary results.   Nicloe Frontera, RVT 07/10/2021, 5:20 PM

## 2021-07-10 NOTE — Progress Notes (Signed)
PT Cancellation Note  Patient Details Name: Glen Martinez MRN: [id number] DOB: 06-21-1931   Cancelled Treatment:    Reason Eval/Treat Not Completed: Active bedrest order Patient with active bed rest order with PT order to start 10/2. Will attempt on 10/2.  Yader Criger A. Gilford Rile PT, DPT Acute Rehabilitation Services Pager [phone] Office [phone]    Linna Hoff 07/10/2021, 7:44 AM

## 2021-07-10 NOTE — Progress Notes (Signed)
Echo attempted. Patient eating. Will attempt again later as schedule permits.

## 2021-07-10 NOTE — Evaluation (Addendum)
Speech Language Pathology Evaluation Patient Details Name: Glen Martinez MRN: [id number] DOB: 06/23/1931 Today's Date: 07/10/2021 Time: 6734-1937 SLP Time Calculation (min) (ACUTE ONLY): 31 min  Problem List:  Patient Active Problem List   Diagnosis Date Noted   Hemorrhagic stroke ([place]) 07/10/2021   Abnormal ankle brachial index (ABI) 07/03/2019   Benign prostatic hyperplasia 04/09/2018   Hyperlipidemia 04/09/2018   Hypertension 04/09/2018   Hypothyroidism 04/09/2018   Personal history of gout 07/18/2016   Type 2 diabetes mellitus with stage 3 chronic kidney disease, with long-term current use of insulin ([place]) 11/20/2015   ED (erectile dysfunction) of organic origin 08/01/2012   Elevated prostate specific antigen (PSA) 08/01/2012   Nodular prostate with urinary obstruction 08/01/2012   Incomplete emptying of bladder 08/01/2012   Chronic prostatitis 08/01/2012   Disorder of male genital organs 08/01/2012   Urinary tract infection 08/01/2012   Past Medical History:  Past Medical History:  Diagnosis Date   Diabetes mellitus without complication ([place])    GERD (gastroesophageal reflux disease)    Gout    Hyperlipidemia    Hypertension    Hypothyroidism    Wears dentures    partial upper and lower   Past Surgical History:  Past Surgical History:  Procedure Laterality Date   CATARACT EXTRACTION W/PHACO Left 05/02/2018   Procedure: CATARACT EXTRACTION PHACO AND INTRAOCULAR LENS PLACEMENT ([place]) DIABETIC;  Surgeon: Leandrew Koyanagi, MD;  Location: [facility];  Service: Ophthalmology;  Laterality: Left;  diabetic - insulin   COLONOSCOPY     HERNIA REPAIR     TONSILLECTOMY     HPI:  87 year old male with past medical history of DM, HTN, HLD.  MRI on 9/30 reported acute infarcts in the right occipital lobe and right hippocampus, and additional smaller acute infarcts in the left occipital cortex.  Petechial hemorrhage in the R occipital lobe reported as well.    Assessment / Plan / Recommendation Clinical Impression  Pt was seen for a cognitive-linguistic evaluation in the setting of acute infarcts.  Pt was encountered awake/alert with wife and granddaughter at bedside.  Pt and spouse reported that pt appeared to be at his cognitive baseline.  They additionally reported that they shared the responsibilities of financial and medication management at home (IADLs).  Pt completed the [place] in addition to informal evaluation measures.  He scored overall 13/30 indicating a cognitive impairment.  Deficits were observed in short term memory, attention , thought organization, and problem solving.  Expressive and receptive language were functional and no dysarthria was observed.  Of note, oral mechanism examination was remarkable for a decreased facial ROM on the L, suspicious for CN VII dysfunction.  Recommend continued ST acutely targeting cognitive deficits and supervision/assistance with IADLs at time of discharge.  Discussed possible outpatient or home health ST at time of discharge; however, pt and wife politely declined stating that pt was close to his cognitive baseline.  [place] Examination Orientation  3/3  Numeric Problem Solving  1/3  Memory  2/5  Attention 0/2  Thought Organization 1/3  Clock Drawing 0/4  Visuospatial Skills    2/2  Short Story Recall  4/8  Total  13/30   Scoring  High School Education  Less than High School Education   Normal  27-30 25-30  Mild Neurocognitive Disorder 21-26 20-24  Dementia  1-20 1-19       SLP Assessment  SLP Recommendation/Assessment: Patient needs continued Speech Lanaguage Pathology Services SLP Visit Diagnosis: Cognitive communication deficit (R41.841)  Recommendations for follow up therapy are one component of a multi-disciplinary discharge planning process, led by the attending physician.  Recommendations may be updated based on patient status, additional functional criteria and insurance  authorization.    Follow Up Recommendations  Pt and wife politely declined follow up ST    Frequency and Duration min 2x/week  2 weeks      SLP Evaluation Cognition  Overall Cognitive Status: Impaired/Different from baseline Arousal/Alertness: Awake/alert Orientation Level: Oriented X4 Attention: Focused;Sustained Focused Attention: Impaired Focused Attention Impairment: Verbal basic Sustained Attention: Impaired Sustained Attention Impairment: Verbal basic Memory: Impaired Memory Impairment: Decreased short term memory Decreased Short Term Memory: Verbal basic Awareness: Appears intact Problem Solving: Impaired Problem Solving Impairment: Functional complex Executive Function: Writer: Impaired Organizing Impairment: Functional complex Safety/Judgment: Appears intact       Comprehension  Auditory Comprehension Overall Auditory Comprehension: Appears within functional limits for tasks assessed    Expression Expression Primary Mode of Expression: Verbal Verbal Expression Overall Verbal Expression: Appears within functional limits for tasks assessed Written Expression Dominant Hand: Right   Oral / Motor  Oral Motor/Sensory Function Overall Oral Motor/Sensory Function: Mild impairment Facial ROM: Reduced left;Suspected CN VII (facial) dysfunction Facial Symmetry: Within Functional Limits Facial Strength: Within Functional Limits Facial Sensation: Within Functional Limits Lingual ROM: Within Functional Limits Lingual Symmetry: Within Functional Limits Mandible: Within Functional Limits Motor Speech Overall Motor Speech: Appears within functional limits for tasks assessed   GO                   Colin Mulders., M.S., [place] Office: [phone]  [place] 07/10/2021, 11:39 AM

## 2021-07-10 NOTE — ED Notes (Signed)
Patient transported to CT 

## 2021-07-11 DIAGNOSIS — D72829 Elevated white blood cell count, unspecified: Secondary | ICD-10-CM

## 2021-07-11 DIAGNOSIS — N179 Acute kidney failure, unspecified: Secondary | ICD-10-CM

## 2021-07-11 LAB — CBC
HCT: 39.6 % (ref 39.0–52.0)
Hemoglobin: 13 g/dL (ref 13.0–17.0)
MCH: 30.5 pg (ref 26.0–34.0)
MCHC: 32.8 g/dL (ref 30.0–36.0)
MCV: 93 fL (ref 80.0–100.0)
Platelets: 143 10*3/uL — ABNORMAL LOW (ref 150–400)
RBC: 4.26 MIL/uL (ref 4.22–5.81)
RDW: 14.1 % (ref 11.5–15.5)
WBC: 11.6 10*3/uL — ABNORMAL HIGH (ref 4.0–10.5)
nRBC: 0 % (ref 0.0–0.2)

## 2021-07-11 LAB — GLUCOSE, CAPILLARY
Glucose-Capillary: 155 mg/dL — ABNORMAL HIGH (ref 70–99)
Glucose-Capillary: 202 mg/dL — ABNORMAL HIGH (ref 70–99)
Glucose-Capillary: 161 mg/dL — ABNORMAL HIGH (ref 70–99)
Glucose-Capillary: 87 mg/dL (ref 70–99)

## 2021-07-11 LAB — BASIC METABOLIC PANEL
Anion gap: 7 (ref 5–15)
BUN: 22 mg/dL (ref 8–23)
CO2: 27 mmol/L (ref 22–32)
Calcium: 8.6 mg/dL — ABNORMAL LOW (ref 8.9–10.3)
Chloride: 102 mmol/L (ref 98–111)
Creatinine, Ser: 1.61 mg/dL — ABNORMAL HIGH (ref 0.61–1.24)
GFR, Estimated: 41 mL/min — ABNORMAL LOW (ref >60)
Glucose, Bld: 144 mg/dL — ABNORMAL HIGH (ref 70–99)
Potassium: 3.5 mmol/L (ref 3.5–5.1)
Sodium: 136 mmol/L (ref 135–145)

## 2021-07-11 MED ORDER — SODIUM CHLORIDE 0.9 % IV SOLN: INTRAVENOUS | Status: DC

## 2021-07-11 MED ORDER — LABETALOL HCL 5 MG/ML IV SOLN
5.0000 mg | INTRAVENOUS | Status: DC | PRN
  Administered 2021-07-11 – 2021-07-12 (×5): 10 mg via INTRAVENOUS
  Filled 2021-07-11 (×6): qty 4

## 2021-07-11 NOTE — Evaluation (Signed)
Occupational Therapy Evaluation Patient Details Name: Glen Martinez MRN: [id number] DOB: 1931/01/26 Today's Date: 07/11/2021   History of Present Illness 87 y/o male presented to [facility] ED on 9/30 for dizziness after fall striking his head and R shoulder. Transferred to [facility]. CT head revealed R occipital lobe hypodensity concerning for acute/subacute R PCA infarct. 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 PMH: DM2, GERD, HTN, hypothyroidism, gout   Clinical Impression   Pt admitted with above. He demonstrates the below listed deficits and will benefit from continued OT to maximize safety and independence with BADLs.  Pt presents to OT with deficits in cognition, vision, mild Lt inattention, and decreased awareness.  He currently is able to complete ADLs and functional mobility with supervision.   He demonstrates difficulties with executive functions, and tasks requiring higher level cognitive skills.  PTA, he lives with his wife and was fully independent, including driving.  Recommend 24 hour supervision initially, direct supervision with medication management, and no driving.         Recommendations for follow up therapy are one component of a multi-disciplinary discharge planning process, led by the attending physician.  Recommendations may be updated based on patient status, additional functional criteria and insurance authorization.   Follow Up Recommendations  Outpatient OT;Supervision/Assistance - 24 hour (initially; and no driving)    Equipment Recommendations  None recommended by OT    Recommendations for Other Services       Precautions / Restrictions Precautions Precautions: Fall Precaution Comments: mild L inattention? SBP <160 Restrictions Weight Bearing Restrictions: No      Mobility Bed Mobility Overal bed mobility: Modified Independent                  Transfers Overall transfer  level: Modified independent Equipment used: None                  Balance Overall balance assessment: Mild deficits observed, not formally tested                                         ADL either performed or assessed with clinical judgement   ADL Overall ADL's : Needs assistance/impaired Eating/Feeding: Independent   Grooming: Wash/dry hands;Wash/dry face;Oral care;Brushing hair;Supervision/safety;Standing   Upper Body Bathing: Set up;Sitting   Lower Body Bathing: Supervison/ safety;Sit to/from stand   Upper Body Dressing : Set up;Sitting   Lower Body Dressing: Supervision/safety;Sit to/from stand   Toilet Transfer: Supervision/safety;Ambulation;Comfort height toilet   Toileting- Clothing Manipulation and Hygiene: Supervision/safety;Sit to/from stand       Functional mobility during ADLs: Supervision/safety       Vision Baseline Vision/History: 1 Wears glasses Ability to See in Adequate Light: 0 Adequate Patient Visual Report: No change from baseline Vision Assessment?: Yes Eye Alignment: Within Functional Limits Ocular Range of Motion: Within Functional Limits Alignment/Gaze Preference: Within Defined Limits Tracking/Visual Pursuits: Able to track stimulus in all quads without difficulty Saccades: Within functional limits Visual Fields: No apparent deficits Additional Comments: Pt does demonstrate inefficient scanning strategy with ? mild Lt inattention     Perception Perception Perception Tested?: Yes Perception Deficits: Inattention/neglect Inattention/Neglect: Does not attend to left visual field   Praxis Praxis Praxis tested?: Within functional limits    Pertinent Vitals/Pain Pain Assessment: No/denies pain     Hand Dominance Right  Extremity/Trunk Assessment Upper Extremity Assessment Upper Extremity Assessment: LUE deficits/detail LUE Deficits / Details: Pt reports pain with shoulder ROM 7/10.   He has full AAROM, but is  very painful, and difficult to elevate his arm without assist.   Lower Extremity Assessment Lower Extremity Assessment: Defer to PT evaluation   Cervical / Trunk Assessment Cervical / Trunk Assessment: Normal   Communication Communication Communication: No difficulties   Cognition Arousal/Alertness: Awake/alert Behavior During Therapy: WFL for tasks assessed/performed Overall Cognitive Status: Impaired/Different from baseline Area of Impairment: Attention;Memory;Problem solving;Awareness                   Current Attention Level: Alternating Memory: Decreased short-term memory     Awareness: Emergent Problem Solving: Requires verbal cues General Comments: Pt oriented x 4.  He scored 10/28 on the short Blessed Test which is indicative of probable cognitive deficit.   He demonstrated difficulty with alternating attention, as well as deficits with memory.  During functional path finding task, pt was able to follow a 4 step command, but was unable to make his way back to his room, requiring max cues.  He was noted to freuqently miss Room numbers on his left, and had difficulty effiently scanning his environment   General Comments  wife present.  Discussed results of eval with her.  Recommend that she provide direct supervision with medication and financial management.  She agreed.  Also discussed recommendation for OPOT and both pt and wife agreed    Exercises Exercises: Other exercises Other Exercises Other Exercises: AAROM performed Lt shoulder.  encouraged lap slides   Shoulder Instructions      Home Living Family/patient expects to be discharged to:: Private residence Living Arrangements: Spouse/significant other Available Help at Discharge: Family Type of Home: House Home Access: Stairs to enter [employer] of Steps: 1 (threshold)   Home Layout: One level     Bathroom Shower/Tub: Teacher, early years/pre: Standard     Home Equipment:  None      Lives With: Spouse    Prior Functioning/Environment Level of Independence: Independent        Comments: manages own medications, drives, manages finances.  He is retired from [employer].  Lived in [place] for > 40 years before moving to [facility]        OT Problem List: Impaired vision/perception;Decreased cognition;Decreased safety awareness      OT Treatment/Interventions: Self-care/ADL training;Cognitive remediation/compensation;Visual/perceptual remediation/compensation;Patient/family education    OT Goals(Current goals can be found in the care plan section) Acute Rehab OT Goals Patient Stated Goal: to go home OT Goal Formulation: With patient/family Time For Goal Achievement: 07/25/21 Potential to Achieve Goals: Good ADL Goals Additional ADL Goal #1: Pt will perform moderately challenging path finding task with no cues Additional ADL Goal #2: Pt will utilize an organized scanning strategy to locate items on his left in a busy, unstructured environment with no more than min cues.  OT Frequency: Min 2X/week   Barriers to D/C:            Co-evaluation              AM-PAC OT "6 Clicks" Daily Activity     Outcome Measure Help from another person eating meals?: None Help from another person taking care of personal grooming?: A Little Help from another person toileting, which includes using toliet, bedpan, or urinal?: A Little Help from another person bathing (including washing, rinsing, drying)?: A Little Help from another person to put  on and taking off regular upper body clothing?: A Little Help from another person to put on and taking off regular lower body clothing?: A Little 6 Click Score: 19   End of Session Nurse Communication: Mobility status  Activity Tolerance: Patient tolerated treatment well Patient left: in chair;with call bell/phone within reach;with chair alarm set;with family/visitor present  OT Visit Diagnosis: Cognitive communication deficit  (K87.681)                Time: 1572-6203 OT Time Calculation (min): 53 min Charges:  OT General Charges $OT Visit: 1 Visit OT Treatments $Therapeutic Activity: 23-37 mins $Therapeutic Exercise: 8-22 mins  Nilsa Nutting., OTR/L Acute Rehabilitation Services Pager [phone] Office [phone]   Lucille Passy M 07/11/2021, 12:58 PM

## 2021-07-11 NOTE — Evaluation (Signed)
Physical Therapy Evaluation Patient Details Name: Glen Martinez MRN: [id number] DOB: 1931/05/29 Today's Date: 07/11/2021  History of Present Illness  87 y/o male presented to [facility] ED on 9/30 for dizziness after fall striking his head and R shoulder. Transferred to [facility]. CT head revealed R occipital lobe hypodensity concerning for acute/subacute R PCA infarct. 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 PMH: DM2, GERD, HTN, hypothyroidism, gout  Clinical Impression  PTA, patient lives with wife and reports independence. On first visit with patient after obtaining home information, patient SBP 190s so deferred mobility until PO meds began working. On second visit, SBP in 140s with patient eager to mobilize. Patient modI for bed mobility and transfers. Supervision required for mobility due to potential L inattention?. Mild balance deficits noted during mobility with head turns and sudden stops. No family present to determine baseline cognition during PT eval. Patient will benefit from skilled PT services during acute stay to address listed deficits. No PT follow up recommended at this time.         Recommendations for follow up therapy are one component of a multi-disciplinary discharge planning process, led by the attending physician.  Recommendations may be updated based on patient status, additional functional criteria and insurance authorization.  Follow Up Recommendations No PT follow up;Supervision for mobility/OOB    Equipment Recommendations  None recommended by PT    Recommendations for Other Services       Precautions / Restrictions Precautions Precautions: Fall Precaution Comments: mild L inattention? SBP <160 Restrictions Weight Bearing Restrictions: No      Mobility  Bed Mobility Overal bed mobility: Modified Independent                  Transfers Overall transfer level: Modified  independent Equipment used: None                Ambulation/Gait Ambulation/Gait assistance: Supervision Gait Distance (Feet): 200 Feet Assistive device: None Gait Pattern/deviations: Step-through pattern;Drifts right/left Gait velocity: decreased   General Gait Details: mild balance deficits noted with head turns and sudden stops. Supervision for safetyt  Stairs            Wheelchair Mobility    Modified Rankin (Stroke Patients Only) Modified Rankin (Stroke Patients Only) Pre-Morbid Rankin Score: No symptoms Modified Rankin: Moderately severe disability     Balance Overall balance assessment: Mild deficits observed, not formally tested                                           Pertinent Vitals/Pain Pain Assessment: No/denies pain    Home Living Family/patient expects to be discharged to:: Private residence Living Arrangements: Spouse/significant other Available Help at Discharge: Family Type of Home: House Home Access: Stairs to enter   Technical brewer of Steps: 1 (threshold) Home Layout: One level Home Equipment: None      Prior Function Level of Independence: Independent         Comments: manages own medications, drives     Hand Dominance        Extremity/Trunk Assessment   Upper Extremity Assessment Upper Extremity Assessment: Defer to OT evaluation    Lower Extremity Assessment Lower Extremity Assessment: Overall WFL for tasks assessed    Cervical / Trunk Assessment Cervical / Trunk Assessment: Normal  Communication  Communication: No difficulties  Cognition Arousal/Alertness: Awake/alert Behavior During Therapy: WFL for tasks assessed/performed Overall Cognitive Status: No family/caregiver present to determine baseline cognitive functioning                                 General Comments: A&Ox4. Follows all commands appropriately. Mild L inattention potentially noted while in bathroom  with inability to locate washcloth on L side      General Comments      Exercises     Assessment/Plan    PT Assessment Patient needs continued PT services  PT Problem List Decreased balance;Decreased strength;Decreased mobility;Decreased cognition;Decreased safety awareness       PT Treatment Interventions DME instruction;Gait training;Stair training;Functional mobility training;Therapeutic activities;Therapeutic exercise;Balance training;Patient/family education    PT Goals (Current goals can be found in the Care Plan section)  Acute Rehab PT Goals Patient Stated Goal: to go home PT Goal Formulation: With patient Time For Goal Achievement: 07/25/21 Potential to Achieve Goals: Good    Frequency Min 4X/week   Barriers to discharge        Co-evaluation               AM-PAC PT "6 Clicks" Mobility  Outcome Measure Help needed turning from your back to your side while in a flat bed without using bedrails?: None Help needed moving from lying on your back to sitting on the side of a flat bed without using bedrails?: None Help needed moving to and from a bed to a chair (including a wheelchair)?: None Help needed standing up from a chair using your arms (e.g., wheelchair or bedside chair)?: None Help needed to walk in hospital room?: A Little Help needed climbing 3-5 steps with a railing? : A Little 6 Click Score: 22    End of Session Equipment Utilized During Treatment: Gait belt Activity Tolerance: Patient tolerated treatment well Patient left: in chair;with call bell/phone within reach;with chair alarm set Nurse Communication: Mobility status PT Visit Diagnosis: Unsteadiness on feet (R26.81)    Time: 0835 [phone] (1010) PT Time Calculation (min) (ACUTE ONLY): 16 min   Charges:   PT Evaluation $PT Eval Moderate Complexity: 1 Mod          Ellamarie Naeve A. Gilford Rile PT, DPT Acute Rehabilitation Services Pager [phone] Office [phone]   Linna Hoff 07/11/2021, 12:25 PM

## 2021-07-12 ENCOUNTER — Encounter (HOSPITAL_COMMUNITY)
Admission: EM | Disposition: A | Discharge status: Home / Self Care | Payer: Self-pay | Source: Home / Self Care | Attending: Neurology

## 2021-07-12 ENCOUNTER — Encounter (HOSPITAL_COMMUNITY): Payer: Self-pay | Admitting: Internal Medicine

## 2021-07-12 DIAGNOSIS — E11649 Type 2 diabetes mellitus with hypoglycemia without coma: Secondary | ICD-10-CM

## 2021-07-12 DIAGNOSIS — I639 Cerebral infarction, unspecified: Secondary | ICD-10-CM

## 2021-07-12 DIAGNOSIS — I6389 Other cerebral infarction: Secondary | ICD-10-CM

## 2021-07-12 HISTORY — PX: LOOP RECORDER INSERTION: EP1214

## 2021-07-12 LAB — CBC
HCT: 37.2 % — ABNORMAL LOW (ref 39.0–52.0)
Hemoglobin: 12.1 g/dL — ABNORMAL LOW (ref 13.0–17.0)
MCH: 30.4 pg (ref 26.0–34.0)
MCHC: 32.5 g/dL (ref 30.0–36.0)
MCV: 93.5 fL (ref 80.0–100.0)
Platelets: 134 10*3/uL — ABNORMAL LOW (ref 150–400)
RBC: 3.98 MIL/uL — ABNORMAL LOW (ref 4.22–5.81)
RDW: 14.1 % (ref 11.5–15.5)
WBC: 9.8 10*3/uL (ref 4.0–10.5)
nRBC: 0 % (ref 0.0–0.2)

## 2021-07-12 LAB — GLUCOSE, CAPILLARY
Glucose-Capillary: 131 mg/dL — ABNORMAL HIGH (ref 70–99)
Glucose-Capillary: 184 mg/dL — ABNORMAL HIGH (ref 70–99)

## 2021-07-12 LAB — BASIC METABOLIC PANEL
Anion gap: 8 (ref 5–15)
BUN: 20 mg/dL (ref 8–23)
CO2: 24 mmol/L (ref 22–32)
Calcium: 8.2 mg/dL — ABNORMAL LOW (ref 8.9–10.3)
Chloride: 105 mmol/L (ref 98–111)
Creatinine, Ser: 1.53 mg/dL — ABNORMAL HIGH (ref 0.61–1.24)
GFR, Estimated: 44 mL/min — ABNORMAL LOW (ref >60)
Glucose, Bld: 94 mg/dL (ref 70–99)
Potassium: 3.6 mmol/L (ref 3.5–5.1)
Sodium: 137 mmol/L (ref 135–145)

## 2021-07-12 SURGERY — LOOP RECORDER INSERTION

## 2021-07-12 MED ORDER — ACETAMINOPHEN 325 MG PO TABS
325.0000 mg | ORAL_TABLET | ORAL | Status: DC | PRN
  Filled 2021-07-12: qty 2

## 2021-07-12 MED ORDER — LIDOCAINE-EPINEPHRINE 1 %-1:100000 IJ SOLN
INTRAMUSCULAR | Status: AC
  Filled 2021-07-12: qty 1

## 2021-07-12 MED ORDER — LIDOCAINE-EPINEPHRINE 1 %-1:100000 IJ SOLN
INTRAMUSCULAR | Status: DC | PRN
  Administered 2021-07-12: 30 mL

## 2021-07-12 MED ORDER — ONDANSETRON HCL 4 MG/2ML IJ SOLN: 4.0000 mg | Freq: Four times a day (QID) | INTRAMUSCULAR | Status: DC | PRN

## 2021-07-12 SURGICAL SUPPLY — 2 items
MONITOR MOBILE MNGR LINQ22 (Prosthesis & Implant Heart) ×2 IMPLANT
PACK LOOP INSERTION (CUSTOM PROCEDURE TRAY) ×2 IMPLANT

## 2021-07-12 NOTE — Discharge Instructions (Signed)

## 2021-07-12 NOTE — Consult Note (Addendum)
ELECTROPHYSIOLOGY CONSULT NOTE  Patient ID: Glen Martinez MRN: [id number], DOB/AGE: 1931/03/21   Admit date: 07/09/2021 Date of Consult: 07/12/2021  Primary Physician: Tracie Harrier, MD Primary Cardiologist: None  Primary Electrophysiologist: New to Dr. Lovena Le Reason for Consultation: Cryptogenic stroke; recommendations regarding Implantable Loop Recorder Insurance: [employer]  History of Present Illness EP has been asked to evaluate Glen Martinez for placement of an implantable loop recorder to monitor for atrial fibrillation by Dr Erlinda Hong.  The patient was admitted on 07/09/2021 with dizziness, fall hitting head, and vision changes hitting left shoulder on the door. No tPA given due to subacute stroke with petechial hemorrhage.    Imaging demonstrated bilateral R>L PCA infarcts with right P3 occlusion and petechial hemorrhage (HI2).    He has undergone workup for stroke:  Stroke:  bilateral R>L PCA infarcts with right P3 occlusion and petechial hemorrhage (HI2), embolic secondary to unknown source Resultant left upper quadrant simultanagnosia MRI large right PCA infarct with confluent petechial hemorrhage, left PCA small infarct CTA head and neck right P3 possible occlusion 2D Echo EF 65 to 70% LE venous Doppler no DVT LDL 42 HgbA1c 7.2 SCDs for VTE prophylaxis aspirin 81 mg daily prior to admission, now on aspirin 81 mg daily.  Continue on discharge  The patient has been monitored on telemetry which has demonstrated sinus rhythm with no arrhythmias.  Inpatient stroke work-up will not require a TEE per Neurology.   Echocardiogram as above. Lab work is reviewed.  Prior to admission, the patient denies chest pain, shortness of breath, dizziness, palpitations, or syncope.  He is recovering from his stroke with plans to return home  at discharge.  Past Medical History:  Diagnosis Date   Diabetes mellitus without complication (HCC)    GERD (gastroesophageal reflux  disease)    Gout    Hyperlipidemia    Hypertension    Hypothyroidism    Wears dentures    partial upper and lower     Surgical History:  Past Surgical History:  Procedure Laterality Date   CATARACT EXTRACTION W/PHACO Left 05/02/2018   Procedure: CATARACT EXTRACTION PHACO AND INTRAOCULAR LENS PLACEMENT ([place]) DIABETIC;  Surgeon: Leandrew Koyanagi, MD;  Location: [facility];  Service: Ophthalmology;  Laterality: Left;  diabetic - insulin   COLONOSCOPY     HERNIA REPAIR     TONSILLECTOMY       Medications Prior to Admission  Medication Sig Dispense Refill Last Dose   allopurinol (ZYLOPRIM) 100 MG tablet Take 100 mg by mouth daily.      amLODipine (NORVASC) 2.5 MG tablet Take 2.5 mg by mouth daily.      aspirin 81 MG tablet Take 81 mg by mouth daily.      atenolol (TENORMIN) 100 MG tablet Take by mouth.      Cyanocobalamin (B-12) 1000 MCG TABS Take 1,000 mcg by mouth daily.      famotidine (PEPCID) 20 MG tablet Take 1 tablet (20 mg total) by mouth daily. 30 tablet 1    finasteride (PROSCAR) 5 MG tablet Take 1 tablet (5 mg total) by mouth daily. 90 tablet 3    gabapentin (NEURONTIN) 300 MG capsule Take 300 mg by mouth at bedtime.      hydrochlorothiazide (HYDRODIURIL) 25 MG tablet Take 25 mg by mouth daily.      insulin NPH-regular Human (NOVOLIN 70/30) (70-30) 100 UNIT/ML injection Inject 8-28 Units into the skin See admin instructions. 28 units at breakfast. 8 units at bedtime.  levothyroxine (SYNTHROID) 88 MCG tablet Take 88 mcg by mouth every morning.      levothyroxine (SYNTHROID, LEVOTHROID) 75 MCG tablet Take 75 mcg by mouth daily before breakfast.      lisinopril (PRINIVIL,ZESTRIL) 40 MG tablet Take 40 mg by mouth daily.      metoprolol succinate (TOPROL-XL) 100 MG 24 hr tablet Take 100 mg by mouth daily.      nitroGLYCERIN (NITROSTAT) 0.4 MG SL tablet SMARTSIG:1 Tablet(s) Sublingual PRN      omeprazole (PRILOSEC) 20 MG capsule Take 20 mg by mouth daily.       sildenafil (REVATIO) 20 MG tablet TAKE 3 TO 5 TABLETS TWO HOURS BEFORE INTERCOURSE ON AN EMPTY STOMACH. DO NOT TAKE WITH NITRATES 50 tablet 3    simvastatin (ZOCOR) 40 MG tablet Take 40 mg by mouth daily.       Inpatient Medications:   allopurinol  100 mg Oral Daily   aspirin EC  81 mg Oral Daily   Chlorhexidine Gluconate Cloth  6 each Topical Daily   finasteride  5 mg Oral Daily   gabapentin  300 mg Oral QHS   insulin aspart  0-15 Units Subcutaneous TID WC   insulin aspart  0-5 Units Subcutaneous QHS   insulin aspart protamine- aspart  10 Units Subcutaneous BID WC   levothyroxine  75 mcg Oral Q0600   lisinopril  40 mg Oral Daily   metoprolol succinate  100 mg Oral Daily   pantoprazole  40 mg Oral Daily   senna-docusate  1 tablet Oral BID   simvastatin  40 mg Oral Daily   vitamin B-12  1,000 mcg Oral Daily    Allergies: No Known Allergies  Social History   Socioeconomic History   Marital status: Married    Spouse name: Not on file   Number of children: Not on file   Years of education: Not on file   Highest education level: Not on file  Occupational History   Not on file  Tobacco Use   Smoking status: Never   Smokeless tobacco: Never  Vaping Use   Vaping Use: Never used  Substance and Sexual Activity   Alcohol use: Yes    Comment: Holidays   Drug use: Not on file   Sexual activity: Not on file  Other Topics Concern   Not on file  Social History Narrative   Not on file   Social Determinants of Health   Financial Resource Strain: Not on file  Food Insecurity: Not on file  Transportation Needs: Not on file  Physical Activity: Not on file  Stress: Not on file  Social Connections: Not on file  Intimate Partner Violence: Not on file     No family history on file.    Review of Systems: All other systems reviewed and are otherwise negative except as noted above.  Physical Exam: Vitals:   07/12/21 0700 07/12/21 0730 07/12/21 0800 07/12/21 0900  BP: (!)  189/98 (!) 165/76 (!) 161/76 (!) 141/78  Pulse: 66 64 62 75  Resp: 16 16 12  (!) 21  Temp:   98.6 F (37 C)   TempSrc:   Oral   SpO2: 97% 97% 96% 97%    GEN- The patient is well appearing, alert and oriented x 3 today.   Head- normocephalic, atraumatic Eyes-  Sclera clear, conjunctiva pink Ears- hearing intact Oropharynx- clear Neck- supple Lungs- Clear to ausculation bilaterally, normal work of breathing Heart- Regular rate and rhythm, no murmurs, rubs or gallops  GI- soft, NT, ND, + BS Extremities- no clubbing, cyanosis, or edema MS- no significant deformity or atrophy Skin- no rash or lesion Psych- euthymic mood, full affect   Labs:   Lab Results  Component Value Date   WBC 9.8 07/12/2021   HGB 12.1 (L) 07/12/2021   HCT 37.2 (L) 07/12/2021   MCV 93.5 07/12/2021   PLT 134 (L) 07/12/2021    Recent Labs  Lab 07/12/21 0120  NA 137  K 3.6  CL 105  CO2 24  BUN 20  CREATININE 1.53*  CALCIUM 8.2*  GLUCOSE 94     Radiology/Studies: CT ANGIO HEAD NECK W WO CM  Result Date: 07/10/2021 CLINICAL DATA:  Acute neurologic deficit EXAM: CT ANGIOGRAPHY HEAD AND NECK TECHNIQUE: Multidetector CT imaging of the head and neck was performed using the standard protocol during bolus administration of intravenous contrast. Multiplanar CT image reconstructions and MIPs were obtained to evaluate the vascular anatomy. Carotid stenosis measurements (when applicable) are obtained utilizing NASCET criteria, using the distal internal carotid diameter as the denominator. CONTRAST:  50mL OMNIPAQUE IOHEXOL 350 MG/ML SOLN COMPARISON:  07/09/2021 head CT and brain MRI FINDINGS: CTA NECK FINDINGS SKELETON: There is no bony spinal canal stenosis. No lytic or blastic lesion. OTHER NECK: Normal pharynx, larynx and major salivary glands. No cervical lymphadenopathy. Unremarkable thyroid gland. UPPER CHEST: No pneumothorax or pleural effusion. No nodules or masses. AORTIC ARCH: There is calcific  atherosclerosis of the aortic arch. There is no aneurysm, dissection or hemodynamically significant stenosis of the visualized portion of the aorta. Conventional 3 vessel aortic branching pattern. The visualized proximal subclavian arteries are widely patent. RIGHT CAROTID SYSTEM: Normal without aneurysm, dissection or stenosis. LEFT CAROTID SYSTEM: Normal without aneurysm, dissection or stenosis. VERTEBRAL ARTERIES: Left dominant configuration. Both origins are clearly patent. There is no dissection, occlusion or flow-limiting stenosis to the skull base (V1-V3 segments). CTA HEAD FINDINGS POSTERIOR CIRCULATION: --Vertebral arteries: Normal V4 segments. --Inferior cerebellar arteries: Normal. --Basilar artery: Normal. --Superior cerebellar arteries: Normal. --Posterior cerebral arteries (PCA): Right PCA may be occluded at the distal right P3 segment. Left PCA is patent. ANTERIOR CIRCULATION: --Intracranial internal carotid arteries: Atherosclerotic calcification of the internal carotid arteries at the skull base without hemodynamically significant stenosis. --Anterior cerebral arteries (ACA): Normal. Both A1 segments are present. Patent anterior communicating artery (a-comm). --Middle cerebral arteries (MCA): Normal. VENOUS SINUSES: As permitted by contrast timing, patent. ANATOMIC VARIANTS: None Review of the MIP images confirms the above findings. IMPRESSION: 1. No emergent large vessel occlusion or high-grade stenosis of the intracranial arteries. 2. Possible occlusion of the distal right P3 segment. Aortic Atherosclerosis (ICD10-I70.0). Electronically Signed   By: Ulyses Jarred M.D.   On: 07/10/2021 01:09   CT HEAD WO CONTRAST (5MM)  Result Date: 07/09/2021 CLINICAL DATA:  Head trauma with dizziness. EXAM: CT HEAD WITHOUT CONTRAST TECHNIQUE: Contiguous axial images were obtained from the base of the skull through the vertex without intravenous contrast. COMPARISON:  None. FINDINGS: Brain: Mild low density in  the periventricular white matter likely related to small vessel disease. Expected cerebral and cerebellar atrophy for age. Right occipital hypoattenuation, including on 17/2, 61/4, and 31/5. No hemorrhage, hydrocephalus, intra-axial, or extra-axial fluid collection. Vascular: No hyperdense vessel or unexpected calcification. Skull: No significant soft tissue swelling.  No skull fracture. Sinuses/Orbits: Normal imaged portions of the orbits and globes. Mucosal thickening ethmoid air cells. Clear mastoid air cells. Other: None. IMPRESSION: 1. Hypoattenuation in the right occipital lobe, suspicious for PCA distribution infarct. Correlate  with acute/subacute symptoms. Consider further evaluation with pre and post-contrast brain MR. 2. No typical posttraumatic findings identified. 3. Sinus disease. Electronically Signed   By: Abigail Miyamoto M.D.   On: 07/09/2021 14:25   MR BRAIN WO CONTRAST  Result Date: 07/09/2021 CLINICAL DATA:  Brain mass or lesion EXAM: MRI HEAD WITHOUT CONTRAST TECHNIQUE: Multiplanar, multiecho pulse sequences of the brain and surrounding structures were obtained without intravenous contrast. COMPARISON:  Same day CT head. FINDINGS: Brain: Acute infarcts in the right occipital lobe and right hippocampus (right PCA territory). Additional smaller acute infarct in the left occipital cortex. Fairly extensive susceptibility artifact in the right occipital lobe, compatible with petechial hemorrhage. Associated edema and regional mass effect without midline shift. No hydrocephalus, extra-axial fluid collection, or mass lesion. Additional mild scattered T2 hyperintensities in the white matter, nonspecific but compatible with chronic microvascular ischemic disease. Vascular: Diminished right vertebral artery flow void. Otherwise, major arterial flow voids are maintained at the skull base. Skull and upper cervical spine: Normal marrow signal. Sinuses/Orbits: Mild paranasal sinus mucosal thickening. No acute  orbital findings. Other: T2 hyperintense and mildly T1 hyperintense signal within the left petrous apex. No associated restricted diffusion. On same day CT head, this region is opacified without definite cortical destruction/expansion. IMPRESSION: 1. Acute infarcts in the right occipital lobe and right hippocampus (right PCA territory). Additional smaller acute infarct in the left occipital cortex (left PCA territory). Associated edema and regional mass effect without midline shift. Fairly extensive petechial hemorrhage in the right occipital lobe. Given involvement of multiple vascular territories, consider a central embolic etiology. 2. Diminished right vertebral artery flow void. This could be technical; however, given the above findings recommend CTA or MRA to further evaluate for stenosis/occlusion. 3. T2 hyperintense and mildly T1 hyperintense signal within the left petrous apex, favored to reflect trapped fluid when correlating with same day CT head. Cholesterol granuloma is thought less likely. Electronically Signed   By: Margaretha Sheffield M.D.   On: 07/09/2021 18:53   DG Shoulder Left  Result Date: 07/09/2021 CLINICAL DATA:  Initial encounter for trauma and pain. EXAM: LEFT SHOULDER - 2+ VIEW COMPARISON:  None. FINDINGS: No acute fracture or dislocation. Visualized portion of the left hemithorax is normal. Degenerative changes about the acromioclavicular joint. IMPRESSION: Degenerative change, without acute osseous finding. Electronically Signed   By: Abigail Miyamoto M.D.   On: 07/09/2021 14:13   ECHOCARDIOGRAM COMPLETE  Result Date: 07/10/2021    ECHOCARDIOGRAM REPORT   Patient Name:   Glen Martinez Date of Exam: 07/10/2021 Medical Rec #:  [id number]       Height:       66.0 in Accession #:    [id number]      Weight:       185.0 lb Date of Birth:  1931/01/25      BSA:          1.935 m Patient Age:    46 years        BP:           113/61 mmHg Patient Gender: M               HR:           78 bpm. Exam  Location:  Inpatient Procedure: 2D Echo, Cardiac Doppler and Color Doppler Indications:    Stroke  History:        Patient has no prior history of Echocardiogram examinations.  Risk Factors:Diabetes and Hypertension. GERD.  Sonographer:    Clayton Lefort RDCS (AE) Referring Phys: [id number] [facility]  Sonographer Comments: Suboptimal subcostal window. IMPRESSIONS  1. Left ventricular ejection fraction, by estimation, is 65 to 70%. The left ventricle has normal function. The left ventricle has no regional wall motion abnormalities. There is mild left ventricular hypertrophy. Left ventricular diastolic parameters are consistent with Grade I diastolic dysfunction (impaired relaxation).  2. Right ventricular systolic function is normal. The right ventricular size is normal.  3. The mitral valve is grossly normal. No evidence of mitral valve regurgitation.  4. The aortic valve is tricuspid. Aortic valve regurgitation is trivial. Mild aortic valve sclerosis is present, with no evidence of aortic valve stenosis. Comparison(s): No prior Echocardiogram. FINDINGS  Left Ventricle: Left ventricular ejection fraction, by estimation, is 65 to 70%. The left ventricle has normal function. The left ventricle has no regional wall motion abnormalities. The left ventricular internal cavity size was normal in size. There is  mild left ventricular hypertrophy. Left ventricular diastolic parameters are consistent with Grade I diastolic dysfunction (impaired relaxation). Indeterminate filling pressures. Right Ventricle: The right ventricular size is normal. No increase in right ventricular wall thickness. Right ventricular systolic function is normal. Left Atrium: Left atrial size was normal in size. Right Atrium: Right atrial size was normal in size. Pericardium: There is no evidence of pericardial effusion. Mitral Valve: The mitral valve is grossly normal. No evidence of mitral valve regurgitation. Tricuspid Valve: The  tricuspid valve is grossly normal. Tricuspid valve regurgitation is trivial. Aortic Valve: The aortic valve is tricuspid. Aortic valve regurgitation is trivial. Mild aortic valve sclerosis is present, with no evidence of aortic valve stenosis. Aortic valve mean gradient measures 2.0 mmHg. Aortic valve peak gradient measures 4.6 mmHg. Aortic valve area, by VTI measures 3.09 cm. Pulmonic Valve: The pulmonic valve was grossly normal. Pulmonic valve regurgitation is trivial. Aorta: The aortic root and ascending aorta are structurally normal, with no evidence of dilitation. Venous: The inferior vena cava was not well visualized. IAS/Shunts: The interatrial septum was not well visualized.  LEFT VENTRICLE PLAX 2D LVIDd:         5.20 cm  Diastology LVIDs:         3.10 cm  LV e' medial:    5.11 cm/s LV PW:         1.30 cm  LV E/e' medial:  11.7 LV IVS:        1.30 cm  LV e' lateral:   6.89 cm/s LVOT diam:     2.30 cm  LV E/e' lateral: 8.7 LV SV:         69 LV SV Index:   35 LVOT Area:     4.15 cm  RIGHT VENTRICLE RV Basal diam:  3.40 cm RV S prime:     13.80 cm/s TAPSE (M-mode): 2.3 cm LEFT ATRIUM           Index       RIGHT ATRIUM           Index LA diam:      2.90 cm 1.50 cm/m  RA Area:     11.60 cm LA Vol (A2C): 34.2 ml 17.68 ml/m RA Volume:   26.90 ml  13.90 ml/m LA Vol (A4C): 57.4 ml 29.67 ml/m  AORTIC VALVE AV Area (Vmax):    3.28 cm AV Area (Vmean):   3.15 cm AV Area (VTI):     3.09 cm AV Vmax:  107.00 cm/s AV Vmean:          70.200 cm/s AV VTI:            0.222 m AV Peak Grad:      4.6 mmHg AV Mean Grad:      2.0 mmHg LVOT Vmax:         84.60 cm/s LVOT Vmean:        53.300 cm/s LVOT VTI:          0.165 m LVOT/AV VTI ratio: 0.74  AORTA Ao Root diam: 3.70 cm MITRAL VALVE MV Area (PHT): 2.64 cm    SHUNTS MV Decel Time: 287 msec    Systemic VTI:  0.16 m MV E velocity: 59.60 cm/s  Systemic Diam: 2.30 cm MV A velocity: 47.80 cm/s MV E/A ratio:  1.25 Lyman Bishop MD Electronically signed by Lyman Bishop MD Signature Date/Time: 07/10/2021/5:47:18 PM    Final    VAS [place] LOWER EXTREMITY VENOUS (DVT)  Result Date: 07/11/2021  Lower Venous DVT Study Patient Name:  Glen Martinez  Date of Exam:   07/10/2021 Medical Rec #: [id number]        Accession #:    [id number] Date of Birth: 1930-11-20       Patient Gender: M Patient Age:   27 years Exam Location:  [facility] Procedure:      VAS [place] LOWER EXTREMITY VENOUS (DVT) Referring Phys: Cornelius Moras XU --------------------------------------------------------------------------------  Indications: Stroke.  Comparison Study: No prior study on file Performing Technologist: Sharion Dove RVS  Examination Guidelines: A complete evaluation includes B-mode imaging, spectral Doppler, color Doppler, and power Doppler as needed of all accessible portions of each vessel. Bilateral testing is considered an integral part of a complete examination. Limited examinations for reoccurring indications may be performed as noted. The reflux portion of the exam is performed with the patient in reverse Trendelenburg.  +---------+---------------+---------+-----------+----------+--------------+ RIGHT    CompressibilityPhasicitySpontaneityPropertiesThrombus Aging +---------+---------------+---------+-----------+----------+--------------+ CFV      Full           Yes      Yes                                 +---------+---------------+---------+-----------+----------+--------------+ SFJ      Full                                                        +---------+---------------+---------+-----------+----------+--------------+ FV Prox  Full                                                        +---------+---------------+---------+-----------+----------+--------------+ FV Mid   Full                                                        +---------+---------------+---------+-----------+----------+--------------+ FV DistalFull                                                         +---------+---------------+---------+-----------+----------+--------------+  PFV      Full                                                        +---------+---------------+---------+-----------+----------+--------------+ POP      Full           Yes      Yes                                 +---------+---------------+---------+-----------+----------+--------------+ PTV      Full                                                        +---------+---------------+---------+-----------+----------+--------------+ PERO     Full                                                        +---------+---------------+---------+-----------+----------+--------------+   +---------+---------------+---------+-----------+----------+--------------+ LEFT     CompressibilityPhasicitySpontaneityPropertiesThrombus Aging +---------+---------------+---------+-----------+----------+--------------+ CFV      Full           Yes      Yes                                 +---------+---------------+---------+-----------+----------+--------------+ SFJ      Full                                                        +---------+---------------+---------+-----------+----------+--------------+ FV Prox  Full                                                        +---------+---------------+---------+-----------+----------+--------------+ FV Mid   Full                                                        +---------+---------------+---------+-----------+----------+--------------+ FV DistalFull                                                        +---------+---------------+---------+-----------+----------+--------------+ PFV      Full                                                        +---------+---------------+---------+-----------+----------+--------------+  POP      Full           Yes      Yes                                  +---------+---------------+---------+-----------+----------+--------------+ PTV      Full                                                        +---------+---------------+---------+-----------+----------+--------------+ PERO     Full                                                        +---------+---------------+---------+-----------+----------+--------------+     Summary: BILATERAL: - No evidence of deep vein thrombosis seen in the lower extremities, bilaterally. -   *See table(s) above for measurements and observations. Electronically signed by Harold Barban MD on 07/11/2021 at 9:29:32 AM.    Final     12-lead ECG on arrival showed NSR at 66 bpm (personally reviewed) All prior EKG's in EPIC reviewed with no documented atrial fibrillation  Telemetry NSR/sinus brady 50-70s (personally reviewed)  Assessment and Plan:  1. Cryptogenic stroke The patient presents with cryptogenic stroke.  The patient does not have a TEE planned for this AM.  I spoke at length with the patient about monitoring for afib with an implantable loop recorder.  Risks, benefits, and alteratives to implantable loop recorder were discussed with the patient today.   At this time, the patient is very clear in their decision to proceed with implantable loop recorder.   Wound care was reviewed with the patient (keep incision clean and dry for 3 days).  Wound check scheduled and entered in AVS. Please call with questions.    Shirley Friar, PA-C 07/12/2021 9:29 AM  EP Attending  Patient seen and examined. Agree with the findings as documented above. The patient has had a stroke of unknown etiology. I have reviewed the indications/risks/benefits/goals/expectations of ILR insertion with the patient and he wishes to proceed.  Carleene Overlie Treanna Dumler,MD

## 2021-07-12 NOTE — Progress Notes (Signed)
PT Cancellation Note  Patient Details Name: CHESTON COURY MRN: [id number] DOB: 1931-08-12   Cancelled Treatment:    Reason Eval/Treat Not Completed: Patient at procedure or test/unavailable - pt going for loop recorder, PT to check back.  Stacie Glaze, PT DPT Acute Rehabilitation Services Pager [phone]  Office [phone]    [place] 07/12/2021, 10:06 AM

## 2021-07-12 NOTE — Discharge Summary (Addendum)
Patient ID: Glen Martinez   MRN: [id number]      DOB: [birth year]  Date of Admission: 07/09/2021 Date of Discharge: 07/12/2021  Attending Physician:  Stroke, Md, MD, Stroke MD Consultant(s):    Cardiology - Evans Lance, MD  Patient's PCP:  Tracie Harrier, MD  DISCHARGE DIAGNOSIS:  Bilateral R>L PCA infarcts with right P3 occlusion and petechial hemorrhage (HI2), embolic secondary to unknown source.  Acute kidney injury Mild thrombocytopenia Hypertension with hypertensive emergency Diabetes HLD   Active Problems:   Hemorrhagic stroke [facility])   Past Medical History:  Diagnosis Date   Diabetes mellitus without complication ([place])    GERD (gastroesophageal reflux disease)    Gout    Hyperlipidemia    Hypertension    Hypothyroidism    Wears dentures    partial upper and lower   Past Surgical History:  Procedure Laterality Date   CATARACT EXTRACTION W/PHACO Left 05/02/2018   Procedure: CATARACT EXTRACTION PHACO AND INTRAOCULAR LENS PLACEMENT ([place]) DIABETIC;  Surgeon: Leandrew Koyanagi, MD;  Location: [place];  Service: Ophthalmology;  Laterality: Left;  diabetic - insulin   COLONOSCOPY     HERNIA REPAIR     LOOP RECORDER INSERTION N/A 07/12/2021   Procedure: LOOP RECORDER INSERTION;  Surgeon: Evans Lance, MD;  Location: [place] [facility];  Service: Cardiovascular;  Laterality: N/A;   TONSILLECTOMY      Family History No family history on file.  Social History  reports that he has never smoked. He has never used smokeless tobacco. He reports current alcohol use. No history on file for drug use.  Allergies as of 07/12/2021   No Known Allergies      Medication List     STOP taking these medications    amLODipine 2.5 MG tablet Commonly known as: NORVASC   atenolol 100 MG tablet Commonly known as: TENORMIN   hydrochlorothiazide 25 MG tablet Commonly known as: HYDRODIURIL   sildenafil 20 MG tablet Commonly known as: REVATIO        TAKE these medications    allopurinol 100 MG tablet Commonly known as: ZYLOPRIM Take 100 mg by mouth daily.   aspirin 81 MG tablet Take 81 mg by mouth daily.   B-12 1000 MCG Tabs Take 1,000 mcg by mouth daily.   famotidine 20 MG tablet Commonly known as: Pepcid Take 1 tablet (20 mg total) by mouth daily.   finasteride 5 MG tablet Commonly known as: PROSCAR Take 1 tablet (5 mg total) by mouth daily.   gabapentin 300 MG capsule Commonly known as: NEURONTIN Take 300 mg by mouth at bedtime.   insulin NPH-regular Human (70-30) 100 UNIT/ML injection Inject 8-28 Units into the skin See admin instructions. 28 units at breakfast. 8 units at bedtime.   levothyroxine 75 MCG tablet Commonly known as: SYNTHROID Take 75 mcg by mouth daily before breakfast. What changed: Another medication with the same name was removed. Continue taking this medication, and follow the directions you see here.   lisinopril 40 MG tablet Commonly known as: ZESTRIL Take 40 mg by mouth daily.   metoprolol succinate 100 MG 24 hr tablet Commonly known as: TOPROL-XL Take 100 mg by mouth daily.   nitroGLYCERIN 0.4 MG SL tablet Commonly known as: NITROSTAT SMARTSIG:1 Tablet(s) Sublingual PRN   omeprazole 20 MG capsule Commonly known as: PRILOSEC Take 20 mg by mouth daily.   simvastatin 40 MG tablet Commonly known as: ZOCOR Take 40 mg by mouth daily.  HOME MEDICATIONS PRIOR TO ADMISSION Medications Prior to Admission  Medication Sig Dispense Refill   allopurinol (ZYLOPRIM) 100 MG tablet Take 100 mg by mouth daily.     amLODipine (NORVASC) 2.5 MG tablet Take 2.5 mg by mouth daily.     aspirin 81 MG tablet Take 81 mg by mouth daily.     atenolol (TENORMIN) 100 MG tablet Take by mouth.     Cyanocobalamin (B-12) 1000 MCG TABS Take 1,000 mcg by mouth daily.     famotidine (PEPCID) 20 MG tablet Take 1 tablet (20 mg total) by mouth daily. 30 tablet 1   finasteride (PROSCAR) 5 MG tablet Take  1 tablet (5 mg total) by mouth daily. 90 tablet 3   gabapentin (NEURONTIN) 300 MG capsule Take 300 mg by mouth at bedtime.     hydrochlorothiazide (HYDRODIURIL) 25 MG tablet Take 25 mg by mouth daily.     insulin NPH-regular Human (NOVOLIN 70/30) (70-30) 100 UNIT/ML injection Inject 8-28 Units into the skin See admin instructions. 28 units at breakfast. 8 units at bedtime.     levothyroxine (SYNTHROID) 88 MCG tablet Take 88 mcg by mouth every morning.     levothyroxine (SYNTHROID, LEVOTHROID) 75 MCG tablet Take 75 mcg by mouth daily before breakfast.     lisinopril (PRINIVIL,ZESTRIL) 40 MG tablet Take 40 mg by mouth daily.     metoprolol succinate (TOPROL-XL) 100 MG 24 hr tablet Take 100 mg by mouth daily.     nitroGLYCERIN (NITROSTAT) 0.4 MG SL tablet SMARTSIG:1 Tablet(s) Sublingual PRN     omeprazole (PRILOSEC) 20 MG capsule Take 20 mg by mouth daily.     sildenafil (REVATIO) 20 MG tablet TAKE 3 TO 5 TABLETS TWO HOURS BEFORE INTERCOURSE ON AN EMPTY STOMACH. DO NOT TAKE WITH NITRATES 50 tablet 3   simvastatin (ZOCOR) 40 MG tablet Take 40 mg by mouth daily.       HOSPITAL MEDICATIONS  allopurinol  100 mg Oral Daily   aspirin EC  81 mg Oral Daily   Chlorhexidine Gluconate Cloth  6 each Topical Daily   finasteride  5 mg Oral Daily   gabapentin  300 mg Oral QHS   insulin aspart  0-15 Units Subcutaneous TID WC   insulin aspart  0-5 Units Subcutaneous QHS   insulin aspart protamine- aspart  10 Units Subcutaneous BID WC   levothyroxine  75 mcg Oral Q0600   lisinopril  40 mg Oral Daily   metoprolol succinate  100 mg Oral Daily   pantoprazole  40 mg Oral Daily   senna-docusate  1 tablet Oral BID   simvastatin  40 mg Oral Daily   vitamin B-12  1,000 mcg Oral Daily    LABORATORY STUDIES CBC    Component Value Date/Time   WBC 9.8 07/12/2021 0120   RBC 3.98 (L) 07/12/2021 0120   HGB 12.1 (L) 07/12/2021 0120   HCT 37.2 (L) 07/12/2021 0120   PLT 134 (L) 07/12/2021 0120   MCV 93.5  07/12/2021 0120   MCH 30.4 07/12/2021 0120   MCHC 32.5 07/12/2021 0120   RDW 14.1 07/12/2021 0120   CMP    Component Value Date/Time   NA 137 07/12/2021 0120   K 3.6 07/12/2021 0120   CL 105 07/12/2021 0120   CO2 24 07/12/2021 0120   GLUCOSE 94 07/12/2021 0120   BUN 20 07/12/2021 0120   CREATININE 1.53 (H) 07/12/2021 0120   CALCIUM 8.2 (L) 07/12/2021 0120   PROT 7.3 01/01/2020 1537   ALBUMIN  4.1 01/01/2020 1537   AST 19 01/01/2020 1537   ALT 15 01/01/2020 1537   ALKPHOS 75 01/01/2020 1537   BILITOT 0.5 01/01/2020 1537   GFRNONAA 44 (L) 07/12/2021 0120   GFRAA >60 01/01/2020 1537   COAGS Lab Results  Component Value Date   INR 1.1 07/10/2021   Lipid Panel    Component Value Date/Time   CHOL 168 07/10/2021 0015   TRIG 332 (H) 07/10/2021 0015   HDL 60 07/10/2021 0015   CHOLHDL 2.8 07/10/2021 0015   VLDL 66 (H) 07/10/2021 0015   LDLCALC 42 07/10/2021 0015   HgbA1C  Lab Results  Component Value Date   HGBA1C 7.2 (H) 07/10/2021   Urinalysis No results found for: COLORURINE, APPEARANCEUR, LABSPEC, PHURINE, GLUCOSEU, HGBUR, BILIRUBINUR, KETONESUR, PROTEINUR, UROBILINOGEN, NITRITE, LEUKOCYTESUR Urine Drug Screen     Component Value Date/Time   LABOPIA NONE DETECTED 07/10/2021 1758   COCAINSCRNUR NONE DETECTED 07/10/2021 1758   LABBENZ NONE DETECTED 07/10/2021 1758   AMPHETMU NONE DETECTED 07/10/2021 1758   THCU NONE DETECTED 07/10/2021 1758   LABBARB NONE DETECTED 07/10/2021 1758    Alcohol Level No results found for: ETH   SIGNIFICANT DIAGNOSTIC STUDIES CT ANGIO HEAD NECK W WO CM  Result Date: 07/10/2021 CLINICAL DATA:  Acute neurologic deficit EXAM: CT ANGIOGRAPHY HEAD AND NECK TECHNIQUE: Multidetector CT imaging of the head and neck was performed using the standard protocol during bolus administration of intravenous contrast. Multiplanar CT image reconstructions and MIPs were obtained to evaluate the vascular anatomy. Carotid stenosis measurements (when  applicable) are obtained utilizing NASCET criteria, using the distal internal carotid diameter as the denominator. CONTRAST:  43mL OMNIPAQUE IOHEXOL 350 MG/ML SOLN COMPARISON:  07/09/2021 head CT and brain MRI FINDINGS: CTA NECK FINDINGS SKELETON: There is no bony spinal canal stenosis. No lytic or blastic lesion. OTHER NECK: Normal pharynx, larynx and major salivary glands. No cervical lymphadenopathy. Unremarkable thyroid gland. UPPER CHEST: No pneumothorax or pleural effusion. No nodules or masses. AORTIC ARCH: There is calcific atherosclerosis of the aortic arch. There is no aneurysm, dissection or hemodynamically significant stenosis of the visualized portion of the aorta. Conventional 3 vessel aortic branching pattern. The visualized proximal subclavian arteries are widely patent. RIGHT CAROTID SYSTEM: Normal without aneurysm, dissection or stenosis. LEFT CAROTID SYSTEM: Normal without aneurysm, dissection or stenosis. VERTEBRAL ARTERIES: Left dominant configuration. Both origins are clearly patent. There is no dissection, occlusion or flow-limiting stenosis to the skull base (V1-V3 segments). CTA HEAD FINDINGS POSTERIOR CIRCULATION: --Vertebral arteries: Normal V4 segments. --Inferior cerebellar arteries: Normal. --Basilar artery: Normal. --Superior cerebellar arteries: Normal. --Posterior cerebral arteries (PCA): Right PCA may be occluded at the distal right P3 segment. Left PCA is patent. ANTERIOR CIRCULATION: --Intracranial internal carotid arteries: Atherosclerotic calcification of the internal carotid arteries at the skull base without hemodynamically significant stenosis. --Anterior cerebral arteries (ACA): Normal. Both A1 segments are present. Patent anterior communicating artery (a-comm). --Middle cerebral arteries (MCA): Normal. VENOUS SINUSES: As permitted by contrast timing, patent. ANATOMIC VARIANTS: None Review of the MIP images confirms the above findings. IMPRESSION: 1. No emergent large vessel  occlusion or high-grade stenosis of the intracranial arteries. 2. Possible occlusion of the distal right P3 segment. Aortic Atherosclerosis (ICD10-I70.0). Electronically Signed   By: Ulyses Jarred M.D.   On: 07/10/2021 01:09   CT HEAD WO CONTRAST (5MM)  Result Date: 07/09/2021 CLINICAL DATA:  Head trauma with dizziness. EXAM: CT HEAD WITHOUT CONTRAST TECHNIQUE: Contiguous axial images were obtained from the base of the skull through  the vertex without intravenous contrast. COMPARISON:  None. FINDINGS: Brain: Mild low density in the periventricular white matter likely related to small vessel disease. Expected cerebral and cerebellar atrophy for age. Right occipital hypoattenuation, including on 17/2, 61/4, and 31/5. No hemorrhage, hydrocephalus, intra-axial, or extra-axial fluid collection. Vascular: No hyperdense vessel or unexpected calcification. Skull: No significant soft tissue swelling.  No skull fracture. Sinuses/Orbits: Normal imaged portions of the orbits and globes. Mucosal thickening ethmoid air cells. Clear mastoid air cells. Other: None. IMPRESSION: 1. Hypoattenuation in the right occipital lobe, suspicious for PCA distribution infarct. Correlate with acute/subacute symptoms. Consider further evaluation with pre and post-contrast brain MR. 2. No typical posttraumatic findings identified. 3. Sinus disease. Electronically Signed   By: Abigail Miyamoto M.D.   On: 07/09/2021 14:25   MR BRAIN WO CONTRAST  Result Date: 07/09/2021 CLINICAL DATA:  Brain mass or lesion EXAM: MRI HEAD WITHOUT CONTRAST TECHNIQUE: Multiplanar, multiecho pulse sequences of the brain and surrounding structures were obtained without intravenous contrast. COMPARISON:  Same day CT head. FINDINGS: Brain: Acute infarcts in the right occipital lobe and right hippocampus (right PCA territory). Additional smaller acute infarct in the left occipital cortex. Fairly extensive susceptibility artifact in the right occipital lobe, compatible  with petechial hemorrhage. Associated edema and regional mass effect without midline shift. No hydrocephalus, extra-axial fluid collection, or mass lesion. Additional mild scattered T2 hyperintensities in the white matter, nonspecific but compatible with chronic microvascular ischemic disease. Vascular: Diminished right vertebral artery flow void. Otherwise, major arterial flow voids are maintained at the skull base. Skull and upper cervical spine: Normal marrow signal. Sinuses/Orbits: Mild paranasal sinus mucosal thickening. No acute orbital findings. Other: T2 hyperintense and mildly T1 hyperintense signal within the left petrous apex. No associated restricted diffusion. On same day CT head, this region is opacified without definite cortical destruction/expansion. IMPRESSION: 1. Acute infarcts in the right occipital lobe and right hippocampus (right PCA territory). Additional smaller acute infarct in the left occipital cortex (left PCA territory). Associated edema and regional mass effect without midline shift. Fairly extensive petechial hemorrhage in the right occipital lobe. Given involvement of multiple vascular territories, consider a central embolic etiology. 2. Diminished right vertebral artery flow void. This could be technical; however, given the above findings recommend CTA or MRA to further evaluate for stenosis/occlusion. 3. T2 hyperintense and mildly T1 hyperintense signal within the left petrous apex, favored to reflect trapped fluid when correlating with same day CT head. Cholesterol granuloma is thought less likely. Electronically Signed   By: Margaretha Sheffield M.D.   On: 07/09/2021 18:53   EP PPM/ICD IMPLANT  Result Date: 07/12/2021 CONCLUSIONS:  1. Successful implantation of a [employer] Reveal LINQ implantable loop recorder for cryptogenic stroke  2. No early apparent complications. Cristopher Peru, MD 07/12/2021 10:49 AM  DG Shoulder Left  Result Date: 07/09/2021 CLINICAL DATA:  Initial  encounter for trauma and pain. EXAM: LEFT SHOULDER - 2+ VIEW COMPARISON:  None. FINDINGS: No acute fracture or dislocation. Visualized portion of the left hemithorax is normal. Degenerative changes about the acromioclavicular joint. IMPRESSION: Degenerative change, without acute osseous finding. Electronically Signed   By: Abigail Miyamoto M.D.   On: 07/09/2021 14:13   ECHOCARDIOGRAM COMPLETE  Result Date: 07/10/2021    ECHOCARDIOGRAM REPORT   Patient Name:   MOUNIR SKIPPER Hardebeck Date of Exam: 07/10/2021 Medical Rec #:  [id number]       Height:       66.0 in Accession #:    [id number]  Weight:       185.0 lb Date of Birth:  1931/08/04      BSA:          1.935 m Patient Age:    22 years        BP:           113/61 mmHg Patient Gender: M               HR:           78 bpm. Exam Location:  Inpatient Procedure: 2D Echo, Cardiac Doppler and Color Doppler Indications:    Stroke  History:        Patient has no prior history of Echocardiogram examinations.                 Risk Factors:Diabetes and Hypertension. GERD.  Sonographer:    Clayton Lefort RDCS (AE) Referring Phys: [id number] [facility]  Sonographer Comments: Suboptimal subcostal window. IMPRESSIONS  1. Left ventricular ejection fraction, by estimation, is 65 to 70%. The left ventricle has normal function. The left ventricle has no regional wall motion abnormalities. There is mild left ventricular hypertrophy. Left ventricular diastolic parameters are consistent with Grade I diastolic dysfunction (impaired relaxation).  2. Right ventricular systolic function is normal. The right ventricular size is normal.  3. The mitral valve is grossly normal. No evidence of mitral valve regurgitation.  4. The aortic valve is tricuspid. Aortic valve regurgitation is trivial. Mild aortic valve sclerosis is present, with no evidence of aortic valve stenosis. Comparison(s): No prior Echocardiogram. FINDINGS  Left Ventricle: Left ventricular ejection fraction, by estimation, is 65 to  70%. The left ventricle has normal function. The left ventricle has no regional wall motion abnormalities. The left ventricular internal cavity size was normal in size. There is  mild left ventricular hypertrophy. Left ventricular diastolic parameters are consistent with Grade I diastolic dysfunction (impaired relaxation). Indeterminate filling pressures. Right Ventricle: The right ventricular size is normal. No increase in right ventricular wall thickness. Right ventricular systolic function is normal. Left Atrium: Left atrial size was normal in size. Right Atrium: Right atrial size was normal in size. Pericardium: There is no evidence of pericardial effusion. Mitral Valve: The mitral valve is grossly normal. No evidence of mitral valve regurgitation. Tricuspid Valve: The tricuspid valve is grossly normal. Tricuspid valve regurgitation is trivial. Aortic Valve: The aortic valve is tricuspid. Aortic valve regurgitation is trivial. Mild aortic valve sclerosis is present, with no evidence of aortic valve stenosis. Aortic valve mean gradient measures 2.0 mmHg. Aortic valve peak gradient measures 4.6 mmHg. Aortic valve area, by VTI measures 3.09 cm. Pulmonic Valve: The pulmonic valve was grossly normal. Pulmonic valve regurgitation is trivial. Aorta: The aortic root and ascending aorta are structurally normal, with no evidence of dilitation. Venous: The inferior vena cava was not well visualized. IAS/Shunts: The interatrial septum was not well visualized.  LEFT VENTRICLE PLAX 2D LVIDd:         5.20 cm  Diastology LVIDs:         3.10 cm  LV e' medial:    5.11 cm/s LV PW:         1.30 cm  LV E/e' medial:  11.7 LV IVS:        1.30 cm  LV e' lateral:   6.89 cm/s LVOT diam:     2.30 cm  LV E/e' lateral: 8.7 LV SV:         69 LV SV Index:  35 LVOT Area:     4.15 cm  RIGHT VENTRICLE RV Basal diam:  3.40 cm RV S prime:     13.80 cm/s TAPSE (M-mode): 2.3 cm LEFT ATRIUM           Index       RIGHT ATRIUM           Index LA  diam:      2.90 cm 1.50 cm/m  RA Area:     11.60 cm LA Vol (A2C): 34.2 ml 17.68 ml/m RA Volume:   26.90 ml  13.90 ml/m LA Vol (A4C): 57.4 ml 29.67 ml/m  AORTIC VALVE AV Area (Vmax):    3.28 cm AV Area (Vmean):   3.15 cm AV Area (VTI):     3.09 cm AV Vmax:           107.00 cm/s AV Vmean:          70.200 cm/s AV VTI:            0.222 m AV Peak Grad:      4.6 mmHg AV Mean Grad:      2.0 mmHg LVOT Vmax:         84.60 cm/s LVOT Vmean:        53.300 cm/s LVOT VTI:          0.165 m LVOT/AV VTI ratio: 0.74  AORTA Ao Root diam: 3.70 cm MITRAL VALVE MV Area (PHT): 2.64 cm    SHUNTS MV Decel Time: 287 msec    Systemic VTI:  0.16 m MV E velocity: 59.60 cm/s  Systemic Diam: 2.30 cm MV A velocity: 47.80 cm/s MV E/A ratio:  1.25 Lyman Bishop MD Electronically signed by Lyman Bishop MD Signature Date/Time: 07/10/2021/5:47:18 PM    Final    VAS [place] LOWER EXTREMITY VENOUS (DVT)  Result Date: 07/11/2021  Lower Venous DVT Study Patient Name:  Glen Martinez Spark  Date of Exam:   07/10/2021 Medical Rec #: [id number]        Accession #:    [id number] Date of Birth: [birth year]       Patient Gender: M Patient Age:   87 years Exam Location:  [facility] Procedure:      VAS [place] LOWER EXTREMITY VENOUS (DVT) Referring Phys: Cornelius Moras Lavonya Hoerner --------------------------------------------------------------------------------  Indications: Stroke.  Comparison Study: No prior study on file Performing Technologist: Sharion Dove RVS  Examination Guidelines: A complete evaluation includes B-mode imaging, spectral Doppler, color Doppler, and power Doppler as needed of all accessible portions of each vessel. Bilateral testing is considered an integral part of a complete examination. Limited examinations for reoccurring indications may be performed as noted. The reflux portion of the exam is performed with the patient in reverse Trendelenburg.  +---------+---------------+---------+-----------+----------+--------------+ RIGHT     CompressibilityPhasicitySpontaneityPropertiesThrombus Aging +---------+---------------+---------+-----------+----------+--------------+ CFV      Full           Yes      Yes                                 +---------+---------------+---------+-----------+----------+--------------+ SFJ      Full                                                        +---------+---------------+---------+-----------+----------+--------------+  FV Prox  Full                                                        +---------+---------------+---------+-----------+----------+--------------+ FV Mid   Full                                                        +---------+---------------+---------+-----------+----------+--------------+ FV DistalFull                                                        +---------+---------------+---------+-----------+----------+--------------+ PFV      Full                                                        +---------+---------------+---------+-----------+----------+--------------+ POP      Full           Yes      Yes                                 +---------+---------------+---------+-----------+----------+--------------+ PTV      Full                                                        +---------+---------------+---------+-----------+----------+--------------+ PERO     Full                                                        +---------+---------------+---------+-----------+----------+--------------+   +---------+---------------+---------+-----------+----------+--------------+ LEFT     CompressibilityPhasicitySpontaneityPropertiesThrombus Aging +---------+---------------+---------+-----------+----------+--------------+ CFV      Full           Yes      Yes                                 +---------+---------------+---------+-----------+----------+--------------+ SFJ      Full                                                         +---------+---------------+---------+-----------+----------+--------------+ FV Prox  Full                                                        +---------+---------------+---------+-----------+----------+--------------+  FV Mid   Full                                                        +---------+---------------+---------+-----------+----------+--------------+ FV DistalFull                                                        +---------+---------------+---------+-----------+----------+--------------+ PFV      Full                                                        +---------+---------------+---------+-----------+----------+--------------+ POP      Full           Yes      Yes                                 +---------+---------------+---------+-----------+----------+--------------+ PTV      Full                                                        +---------+---------------+---------+-----------+----------+--------------+ PERO     Full                                                        +---------+---------------+---------+-----------+----------+--------------+     Summary: BILATERAL: - No evidence of deep vein thrombosis seen in the lower extremities, bilaterally. -   *See table(s) above for measurements and observations. Electronically signed by Harold Barban MD on 07/11/2021 at 9:29:32 AM.    Final        HISTORY OF PRESENT ILLNESS (From H&P Donnetta Simpers, MD 07/09/21) Glen Martinez is a 87 y.o. male with PMH significant for DM2, GERD, HTN, Hypothyroidism, and Gout, who presented to [facility] with dizziness and fall with head trauma but no LOC.  He reports he got up in the morning around 0200 on 07/09/2021 and upon getting up from the bed felt dizzy and fell forward and hit his head on the wall.  He felt fine afterwards so he went to use the bathroom and upon coming back his left shoulder ran into the wall.  The shoulder was  hurting really bad and he noted he had couple more episodes of dizziness which he describes as vertigo so he eventually came to the ED. He had a CTH in the ED which demonstrated R occipital lobe hypodensity concerning for acute/subacute R PCA infarct. No ICH was noted. He subsequently had an MRI a few hours later which demonstrated fairly extensive petechial hemorrhage in the R occipital lobe. He was also noted to have smaller infarcts in the L occipital lobe and R  hippocampus. He is on aspirin 81mg  daily, not on anticoagulation. mRS: 0 tNK/thrombectomy: none, CTH shows completed stroke with now ICH on MRI Brain. LKW: 0200 on 07/09/21.   HOSPITAL COURSE Mr. DENISE BRAMBLETT is a 87 y.o. male with history of diabetes, hypertension admitted for dizziness, fall hitting head and vision changes hitting left shoulder on the door. No tPA given due to subacute stroke with petechial hemorrhage.     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  Continue on discharge Patient counseled to be compliant with his antithrombotic medications Ongoing aggressive stroke risk factor management Therapy recommendations: Outpatient Occupational Therapy recommended Disposition: Discharge to home   Diabetes HgbA1c 7.2 goal < 7.0 Uncontrolled CBG monitoring SSI Close PCP follow up for better DM control   Hypertensive emergency Stable now Off Cleviprex Resume home metoprolol and lisinopril BP goal less than 160 Long term BP goal normotensive   Hyperlipidemia Home  meds: Zocor 40 LDL 42, goal < 70 Now on Zocor 40 Continue statin at discharge   Other Stroke Risk Factors Advanced age   Other Active Problems AKI, Cre 1.44->1.61->1.53   (gentle IVF @ 50) Leukocytosis WBC 12.8->11.6->9.8 (afebrile)  Mild thrombocytopenia - platelets - 180->143->134   DISCHARGE EXAM Vitals:   07/12/21 1115 07/12/21 1130 07/12/21 1142 07/12/21 1200  BP: (!) 148/62 (!) 170/76 (!) 127/48   Pulse: 66 66    Resp: (!) 22 (!) 22    Temp:    98.5 F (36.9 C)  TempSrc:    Oral  SpO2: 95% 95%     General - Well nourished, well developed, in no apparent distress.   Ophthalmologic - fundi not visualized due to noncooperation.   Cardiovascular - Regular rhythm and rate.   Mental Status -  Level of arousal and orientation to time, place, and person were intact.  Language including expression, naming, repetition, comprehension was assessed and found intact. Fund of Knowledge was assessed and was intact.   Cranial Nerves II - XII - II - Visual field intact OU, left upper quadrantanopsia III, IV, VI - Extraocular movements intact. V - Facial sensation intact bilaterally. VII - Facial movement intact bilaterally. VIII - Hearing & vestibular intact bilaterally. X - Palate elevates symmetrically. XI - Chin turning & shoulder shrug intact bilaterally. XII - Tongue protrusion intact.   Motor Strength - The patient's strength was normal in all extremities and pronator drift was absent.  Bulk was normal and fasciculations were absent.   Motor Tone - Muscle tone was assessed at the neck and appendages and was normal.   Reflexes - The patient's reflexes were symmetrical in all extremities and he had no pathological reflexes.   Sensory - Light touch, temperature/pinprick were assessed and were symmetrical.     Coordination - The patient had normal movements in the hands with no ataxia or dysmetria.  Tremor was absent.   Gait and Station - deferred.  DISCHARGE  INSTRUCTIONS GIVEN TO THE PATIENT Wound care as reviewed with cardiology (keep incision clean and dry for 3 days).  Wound check with  Cardiology as scheduled.  Please call them with questions as needed. Drink plenty of fluids for kidney health. No driving unless cleared by an MD  DISCHARGE DIET   Diet Order             Diet Heart Room service appropriate? Yes; Fluid consistency: Thin  Diet effective now                  liquids  DISCHARGE PLAN Disposition:  Discharge to home aspirin 81 mg daily for secondary stroke prevention. Ongoing risk factor control by Primary Care Physician at time of discharge Follow-up Tracie Harrier, MD in 2 weeks. Follow-up in [facility] in 4 weeks, office to schedule an appointment.  Follow-up with Cardiology ([name]) as instructed. Outpatient occupational therapy will be scheduled.  40 minutes were spent preparing discharge.  Rosalin Hawking, MD PhD Stroke Neurology 07/15/2021 6:38 AM

## 2021-07-12 NOTE — TOC CAGE-AID Note (Signed)
Transition of Care [facility]) - CAGE-AID Screening   Patient Details  Name: Glen Martinez MRN: [id number] Date of Birth: [birth year]  Transition of Care [facility]) CM/SW Contact:    [place], [place] Phone Number: 07/12/2021, 1:36 PM   Clinical Narrative: Pt participated in [place].  Pt stated he does not use substance or ETOH.  Pt was not offered resources, due to no usage of substance or ETOH.    Geramy Lamorte Tarpley-Carter, MSW, LCSW-A Pronouns:  She/Her/Hers [facility] Health[facility]itions of Care Clinical Social Worker Direct Number:  [phone] [email or web address]    CAGE-AID Screening:    Have You Ever Felt You Ought to Cut Down on Your Drinking or Drug Use?: No Have People Annoyed You By [employer] Your Drinking Or Drug Use?: No Have You Felt Bad Or Guilty About Your Drinking Or Drug Use?: No Have You Ever Had a Drink or Used Drugs First Thing In The Morning to Steady Your Nerves or to Get Rid of a Hangover?: No CAGE-AID Score: 0  Substance Abuse Education Offered: No

## 2021-07-12 NOTE — Progress Notes (Signed)
Patient meets discharge requirements. Incision site assessed. Patient educated to hold pressure for 5 minutes with 2 fingers if oozing persists. IVs removed. Discharge instructions discussed. Belongings returned to patient. Transportation called. Glen Martinez

## 2021-07-12 NOTE — Progress Notes (Signed)
Physical Therapy Treatment Patient Details Name: Glen Martinez MRN: [id number] DOB: Feb 12, 1931 Today's Date: 07/12/2021   History of Present Illness 87 y/o male presented to [facility] ED on 9/30 for dizziness after fall striking his head and R shoulder. Transferred to [facility]. CT head revealed R occipital lobe hypodensity concerning for acute/subacute R PCA infarct. 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 s/p loop recorder placement 10/3. PMH: DM2, GERD, HTN, hypothyroidism, gout    PT Comments    Pt s/p loop recorder, eager to d/c home. Pt scored 20/24 on DGI balance assessment, not placing pt in high risk of falls category at this time. PT did encourage pt to scan L when up and moving as L inattention is present today. Pt expresses understanding, and states his wife and daughter will help him as needed. Pt plans to d/c today.      Recommendations for follow up therapy are one component of a multi-disciplinary discharge planning process, led by the attending physician.  Recommendations may be updated based on patient status, additional functional criteria and insurance authorization.  Follow Up Recommendations  No PT follow up;Supervision for mobility/OOB     Equipment Recommendations  None recommended by PT    Recommendations for Other Services       Precautions / Restrictions Precautions Precautions: Fall Precaution Comments: mild L inattention, SBP <160 Restrictions Weight Bearing Restrictions: No     Mobility  Bed Mobility Overal bed mobility: Needs Assistance             General bed mobility comments: up in chair    Transfers Overall transfer level: Modified independent Equipment used: None             General transfer comment: mod I for increased time, no physical assist.  Ambulation/Gait Ambulation/Gait assistance: Supervision Gait Distance (Feet): 400 Feet Assistive device:  None Gait Pattern/deviations: Step-through pattern;Drifts right/left;WFL(Within Functional Limits) Gait velocity: decr   General Gait Details: no physical assist, supervision for safety only. Cues for scanning environment on L, x2 periods of bumping L shoulder on hallway object.   Stairs Stairs: Yes Stairs assistance: Supervision Stair Management: One rail Left;Alternating pattern;Forwards Number of Stairs: 3 General stair comments: for safety, slowed speed but performed safely.   Wheelchair Mobility    Modified Rankin (Stroke Patients Only) Modified Rankin (Stroke Patients Only) Pre-Morbid Rankin Score: No symptoms Modified Rankin: Moderately severe disability     Balance Overall balance assessment: Needs assistance   Sitting balance-Leahy Scale: Good       Standing balance-Leahy Scale: Good                   Standardized Balance Assessment Standardized Balance Assessment : Dynamic Gait Index   Dynamic Gait Index Level Surface: Normal Change in Gait Speed: Normal Gait with Horizontal Head Turns: Mild Impairment Gait with Vertical Head Turns: Mild Impairment Gait and Pivot Turn: Normal Step Over Obstacle: Mild Impairment Step Around Obstacles: Normal Steps: Mild Impairment Total Score: 20      Cognition Arousal/Alertness: Awake/alert Behavior During Therapy: WFL for tasks assessed/performed Overall Cognitive Status: Impaired/Different from baseline Area of Impairment: Attention;Memory;Problem solving;Awareness                   Current Attention Level: Selective Memory: Decreased short-term memory     Awareness: Emergent Problem Solving: Requires verbal cues General Comments: Pt stops walking to talk to PT on at  least one occasion, struggles with "multitasking". Follows 3+ step commands today.      Exercises      General Comments        Pertinent Vitals/Pain Pain Assessment: Faces Faces Pain Scale: No hurt Pain Intervention(s):  Monitored during session    Home Living Family/patient expects to be discharged to:: Private residence                    Prior Function            PT Goals (current goals can now be found in the care plan section) Acute Rehab PT Goals Patient Stated Goal: to go home PT Goal Formulation: With patient Time For Goal Achievement: 07/25/21 Potential to Achieve Goals: Good Progress towards PT goals: Progressing toward goals    Frequency    Min 4X/week      PT Plan Current plan remains appropriate    Co-evaluation              AM-PAC PT "6 Clicks" Mobility   Outcome Measure  Help needed turning from your back to your side while in a flat bed without using bedrails?: None Help needed moving from lying on your back to sitting on the side of a flat bed without using bedrails?: None Help needed moving to and from a bed to a chair (including a wheelchair)?: None Help needed standing up from a chair using your arms (e.g., wheelchair or bedside chair)?: None Help needed to walk in hospital room?: A Little Help needed climbing 3-5 steps with a railing? : A Little 6 Click Score: 22    End of Session Equipment Utilized During Treatment: Gait belt Activity Tolerance: Patient tolerated treatment well Patient left: in chair;with call bell/phone within reach;with nursing/sitter in room Nurse Communication: Mobility status PT Visit Diagnosis: Unsteadiness on feet (R26.81)     Time: 7867-5449 PT Time Calculation (min) (ACUTE ONLY): 17 min  Charges:  $Gait Training: 8-22 mins                     Stacie Glaze, PT DPT Acute Rehabilitation Services Pager [phone]  Office [phone]    Roxine Caddy E Ruffin Pyo 07/12/2021, 2:31 PM

## 2021-07-12 NOTE — Progress Notes (Signed)
Mr. Glen Martinez returned back to his room after receiving his loop recorder. Site has minimal drainage that was marked and assessed with the PACU RN at the bedside. Dr. Erlinda Hong was alerted of his return to [address]. Thayer Ohm D

## 2021-07-12 NOTE — Progress Notes (Signed)
Called [place], [place] regarding the patient's dressing. He has had oozing and the gauze is mostly blood. Advised to hold pressure for 5 minutes but leave current dressing in place. Thayer Ohm D

## 2021-07-13 DIAGNOSIS — E119 Type 2 diabetes mellitus without complications: Secondary | ICD-10-CM | POA: Diagnosis not present

## 2021-07-13 DIAGNOSIS — Z0100 Encounter for examination of eyes and vision without abnormal findings: Secondary | ICD-10-CM | POA: Diagnosis not present

## 2021-07-13 DIAGNOSIS — H43823 Vitreomacular adhesion, bilateral: Secondary | ICD-10-CM | POA: Diagnosis not present

## 2021-07-15 DIAGNOSIS — E1122 Type 2 diabetes mellitus with diabetic chronic kidney disease: Secondary | ICD-10-CM | POA: Diagnosis not present

## 2021-07-15 DIAGNOSIS — Z9181 History of falling: Secondary | ICD-10-CM | POA: Diagnosis not present

## 2021-07-15 DIAGNOSIS — E039 Hypothyroidism, unspecified: Secondary | ICD-10-CM | POA: Diagnosis not present

## 2021-07-15 DIAGNOSIS — Z823 Family history of stroke: Secondary | ICD-10-CM | POA: Diagnosis not present

## 2021-07-15 DIAGNOSIS — N400 Enlarged prostate without lower urinary tract symptoms: Secondary | ICD-10-CM | POA: Diagnosis not present

## 2021-07-15 DIAGNOSIS — Z09 Encounter for follow-up examination after completed treatment for conditions other than malignant neoplasm: Secondary | ICD-10-CM | POA: Diagnosis not present

## 2021-07-15 DIAGNOSIS — M25512 Pain in left shoulder: Secondary | ICD-10-CM | POA: Diagnosis not present

## 2021-07-15 DIAGNOSIS — I129 Hypertensive chronic kidney disease with stage 1 through stage 4 chronic kidney disease, or unspecified chronic kidney disease: Secondary | ICD-10-CM | POA: Diagnosis not present

## 2021-07-15 DIAGNOSIS — Z23 Encounter for immunization: Secondary | ICD-10-CM | POA: Diagnosis not present

## 2021-07-22 ENCOUNTER — Ambulatory Visit (INDEPENDENT_AMBULATORY_CARE_PROVIDER_SITE_OTHER): Payer: Medicare HMO

## 2021-07-22 ENCOUNTER — Other Ambulatory Visit: Payer: Self-pay

## 2021-07-22 DIAGNOSIS — I619 Nontraumatic intracerebral hemorrhage, unspecified: Secondary | ICD-10-CM

## 2021-07-22 LAB — CUP PACEART INCLINIC DEVICE CHECK
Date Time Interrogation Session: 20221013165731
Implantable Pulse Generator Implant Date: 20221003

## 2021-07-22 NOTE — Patient Instructions (Addendum)
   After Your Loop Recorder (ILR)   Monitor implant site for redness, swelling, and drainage. Call the device clinic at [phone] if you experience these symptoms or fever/chills.  You may shower 3 days after your loop recorder implant and wash your incision with soap and water. Avoid lotions, ointments, or perfumes over your incision until it is well-healed.  You may use a hot tub or a pool after your wound check appointment if the incision is completely closed.   Remote monitoring is used to monitor your pacemaker from home. This monitoring is scheduled every 31 days by our office.   Attempt to reconnect home monitor by pressing round button at bottom of phone then entering pass code [id number] and tap on [employer] App. If unsuccessful call device clinic at [phone]

## 2021-07-22 NOTE — Progress Notes (Signed)
ILR wound check in clinic. Steri strips removed prior to OV. Wound  healed, scab noted at insertion site educated patient to not pull scab off and to call if redness, swelling or drainage.  Home monitor last transmitted on 07/19/21, instructed patient on how to access phone and open medtronic app, number to device clinic given.

## 2021-07-23 ENCOUNTER — Telehealth: Payer: Self-pay

## 2021-07-23 NOTE — Telephone Encounter (Signed)
I called the patient to try to help him get back connected with the app. I called [employer] for additional help. [employer] ordered the patient a relay monitor. He should receive it in 7-10 business days.

## 2021-07-28 DIAGNOSIS — M12812 Other specific arthropathies, not elsewhere classified, left shoulder: Secondary | ICD-10-CM | POA: Diagnosis not present

## 2021-07-28 DIAGNOSIS — M25512 Pain in left shoulder: Secondary | ICD-10-CM | POA: Diagnosis not present

## 2021-08-09 ENCOUNTER — Telehealth: Payer: Self-pay | Admitting: Internal Medicine

## 2021-08-09 ENCOUNTER — Telehealth: Payer: Self-pay

## 2021-08-09 NOTE — Telephone Encounter (Signed)
   Glen Martinez DOB: January 01, 1931 MRN: [id number]   RIDER WAIVER AND RELEASE OF LIABILITY  For purposes of improving physical access to our facilities, [facility] is pleased to partner with third parties to provide [place] patients or other authorized individuals the option of convenient, on-demand ground transportation services (the [place]") through use of the technology service that enables users to request on-demand ground transportation from independent third-party providers.  By opting to use and accept these [employer], I, the undersigned, hereby agree on behalf of myself, and on behalf of any minor child using the Government social research officer for whom I am the parent or legal guardian, as follows:  Government social research officer provided to me are provided by independent third-party transportation providers who are not [employer] or employees and who are unaffiliated with [employer]. [place] is neither a transportation carrier nor a common or public carrier. [place] [facility] [place] has no control over the quality or safety of the transportation that occurs as a result of the [employer]. [facility] cannot guarantee that any third-party transportation provider will complete any arranged transportation service. [facility] makes no representation, warranty, or guarantee regarding the reliability, timeliness, quality, safety, suitability, or availability of any of the Transport Services or that they will be error free. I fully understand that traveling by vehicle involves risks and dangers of serious bodily injury, including permanent disability, paralysis, and death. I agree, on behalf of myself and on behalf of any minor child using the Transport Services for whom I am the parent or legal guardian, that the entire risk arising out of my use of the [employer] remains solely with me, to the maximum extent permitted under applicable law. The [employer] are provided "as  is" and "as available." [facility] disclaims all representations and warranties, express, implied or statutory, not expressly set out in these terms, including the implied warranties of merchantability and fitness for a particular purpose. I hereby waive and release [facility], its agents, employees, officers, directors, representatives, insurers, attorneys, assigns, successors, subsidiaries, and affiliates from any and all past, present, or future claims, demands, liabilities, actions, causes of action, or suits of any kind directly or indirectly arising from acceptance and use of the [employer]. I further waive and release [facility] and its affiliates from all present and future liability and responsibility for any injury or death to persons or damages to property caused by or related to the use of the [employer]. I have read this Waiver and Release of Liability, and I understand the terms used in it and their legal significance. This Waiver is freely and voluntarily given with the understanding that my right (as well as the right of any minor child for whom I am the parent or legal guardian using the [employer]) to legal recourse against [place] in connection with the [employer] is knowingly surrendered in return for use of these services.   I attest that I read the consent document to Glen Martinez, gave Glen Martinez the opportunity to ask questions and answered the questions asked (if any). I affirm that Glen Martinez then provided consent for he's participation in this program.     Glen Martinez

## 2021-08-09 NOTE — Telephone Encounter (Signed)
Called & spoke with patient & spouse, they are agreeable to appt 08/10/21 at 10:30 am with [facility].  Pt & spouse spoke with Mo at [facility] & has been set up for transportation with them for the appt.

## 2021-08-09 NOTE — Telephone Encounter (Signed)
[facility] alert- LINQ alert received. 19 new AF events, CVR, longest duration 3hrs, 29min,  burden 78.6% ILR indication cryptogenic CVA No OAC, ASA, Metoprolol prescribed  Transmission reviewed with Dr. Caryl Comes in office per protocol.  AF confirmed.  It appears burden may be higher as there is evidence of undersensing in presenting Rhythm.    Dr. Caryl Comes advised AF clinic consult.    Spoke with pt and spouse, advised of findings and importance of AF clinic follow-up.

## 2021-08-10 ENCOUNTER — Encounter (HOSPITAL_COMMUNITY): Payer: Self-pay | Admitting: Physician Assistant

## 2021-08-10 ENCOUNTER — Ambulatory Visit (HOSPITAL_COMMUNITY)
Admission: RE | Admit: 2021-08-10 | Discharge: 2021-08-10 | Disposition: A | Discharge status: Home / Self Care | Payer: Medicare HMO | Source: Ambulatory Visit | Attending: Physician Assistant | Admitting: Physician Assistant

## 2021-08-10 ENCOUNTER — Other Ambulatory Visit: Payer: Self-pay

## 2021-08-10 ENCOUNTER — Other Ambulatory Visit (HOSPITAL_COMMUNITY): Payer: Self-pay | Admitting: *Deleted

## 2021-08-10 VITALS — BP 168/80 | HR 59 | Ht 66.0 in | Wt 177.0 lb

## 2021-08-10 DIAGNOSIS — E119 Type 2 diabetes mellitus without complications: Secondary | ICD-10-CM | POA: Insufficient documentation

## 2021-08-10 DIAGNOSIS — Z7982 Long term (current) use of aspirin: Secondary | ICD-10-CM | POA: Diagnosis not present

## 2021-08-10 DIAGNOSIS — I252 Old myocardial infarction: Secondary | ICD-10-CM | POA: Insufficient documentation

## 2021-08-10 DIAGNOSIS — D6869 Other thrombophilia: Secondary | ICD-10-CM | POA: Diagnosis not present

## 2021-08-10 DIAGNOSIS — E039 Hypothyroidism, unspecified: Secondary | ICD-10-CM | POA: Diagnosis not present

## 2021-08-10 DIAGNOSIS — Z79899 Other long term (current) drug therapy: Secondary | ICD-10-CM | POA: Insufficient documentation

## 2021-08-10 DIAGNOSIS — I480 Paroxysmal atrial fibrillation: Secondary | ICD-10-CM | POA: Insufficient documentation

## 2021-08-10 DIAGNOSIS — Z79890 Hormone replacement therapy (postmenopausal): Secondary | ICD-10-CM | POA: Diagnosis not present

## 2021-08-10 DIAGNOSIS — I10 Essential (primary) hypertension: Secondary | ICD-10-CM | POA: Insufficient documentation

## 2021-08-10 DIAGNOSIS — E785 Hyperlipidemia, unspecified: Secondary | ICD-10-CM | POA: Diagnosis not present

## 2021-08-10 DIAGNOSIS — Z794 Long term (current) use of insulin: Secondary | ICD-10-CM | POA: Insufficient documentation

## 2021-08-10 DIAGNOSIS — Z7901 Long term (current) use of anticoagulants: Secondary | ICD-10-CM | POA: Diagnosis not present

## 2021-08-10 LAB — BASIC METABOLIC PANEL
Anion gap: 7 (ref 5–15)
BUN: 28 mg/dL — ABNORMAL HIGH (ref 8–23)
CO2: 25 mmol/L (ref 22–32)
Calcium: 9.1 mg/dL (ref 8.9–10.3)
Chloride: 105 mmol/L (ref 98–111)
Creatinine, Ser: 1.5 mg/dL — ABNORMAL HIGH (ref 0.61–1.24)
GFR, Estimated: 45 mL/min — ABNORMAL LOW (ref >60)
Glucose, Bld: 232 mg/dL — ABNORMAL HIGH (ref 70–99)
Potassium: 4.1 mmol/L (ref 3.5–5.1)
Sodium: 137 mmol/L (ref 135–145)

## 2021-08-10 LAB — CBC
HCT: 41.2 % (ref 39.0–52.0)
Hemoglobin: 13.1 g/dL (ref 13.0–17.0)
MCH: 30.8 pg (ref 26.0–34.0)
MCHC: 31.8 g/dL (ref 30.0–36.0)
MCV: 96.9 fL (ref 80.0–100.0)
Platelets: 156 10*3/uL (ref 150–400)
RBC: 4.25 MIL/uL (ref 4.22–5.81)
RDW: 14.7 % (ref 11.5–15.5)
WBC: 9.4 10*3/uL (ref 4.0–10.5)
nRBC: 0 % (ref 0.0–0.2)

## 2021-08-10 MED ORDER — APIXABAN 2.5 MG PO TABS: 2.5000 mg | ORAL_TABLET | Freq: Two times a day (BID) | ORAL | 3 refills | Status: DC

## 2021-08-10 NOTE — Progress Notes (Signed)
Primary Care Physician: Tracie Harrier, MD Primary Cardiologist: none Primary Electrophysiologist: [name] Referring Physician: Device clinic/Dr Chilton Si is a 87 y.o. male with a history of DM, HLD, hypothyroidism, CVA, atrial fibrillation who presents for follow up in the [facility].  The patient was initially diagnosed with atrial fibrillation 08/09/21 on an ILR which was placed 07/12/21 for a cryptogenic stroke. The device clinic received a transmission for new AF events with a 78% burden. Patient has a CHADS2VASC score of 5. He was unaware of  his arrhythmias. He denies significant snoring or alcohol use.   Today, he denies symptoms of palpitations, chest pain, shortness of breath, orthopnea, PND, lower extremity edema, dizziness, presyncope, syncope, snoring, daytime somnolence, bleeding, or neurologic sequela. The patient is tolerating medications without difficulties and is otherwise without complaint today.    Atrial Fibrillation Risk Factors:  he does not have symptoms or diagnosis of sleep apnea. he does not have a history of rheumatic fever. he does not have a history of alcohol use. The patient does not have a history of early familial atrial fibrillation or other arrhythmias.  he has a BMI of Body mass index is 28.57 kg/m.Marland Kitchen Filed Weights   08/10/21 1013  Weight: 80.3 kg    No family history on file.   Atrial Fibrillation Management history:  Previous antiarrhythmic drugs: none Previous cardioversions: none Previous ablations: none CHADS2VASC score: 5 Anticoagulation history: none   Past Medical History:  Diagnosis Date   Diabetes mellitus without complication (HCC)    GERD (gastroesophageal reflux disease)    Gout    Hyperlipidemia    Hypertension    Hypothyroidism    Wears dentures    partial upper and lower   Past Surgical History:  Procedure Laterality Date   CATARACT EXTRACTION W/PHACO Left  05/02/2018   Procedure: CATARACT EXTRACTION PHACO AND INTRAOCULAR LENS PLACEMENT ([place]) DIABETIC;  Surgeon: Leandrew Koyanagi, MD;  Location: [facility];  Service: Ophthalmology;  Laterality: Left;  diabetic - insulin   COLONOSCOPY     HERNIA REPAIR     LOOP RECORDER INSERTION N/A 07/12/2021   Procedure: LOOP RECORDER INSERTION;  Surgeon: Evans Lance, MD;  Location: [facility];  Service: Cardiovascular;  Laterality: N/A;   TONSILLECTOMY      Current Outpatient Medications  Medication Sig Dispense Refill   allopurinol (ZYLOPRIM) 100 MG tablet Take 100 mg by mouth daily.     aspirin 81 MG tablet Take 81 mg by mouth daily.     Cyanocobalamin (B-12) 1000 MCG TABS Take 1,000 mcg by mouth daily.     diclofenac Sodium (VOLTAREN) 1 % GEL Apply 2 g topically 4 (four) times daily.     famotidine (PEPCID) 20 MG tablet Take 1 tablet (20 mg total) by mouth daily. 30 tablet 1   finasteride (PROSCAR) 5 MG tablet Take 1 tablet (5 mg total) by mouth daily. 90 tablet 3   gabapentin (NEURONTIN) 300 MG capsule Take 300 mg by mouth at bedtime.     insulin NPH-regular Human (NOVOLIN 70/30) (70-30) 100 UNIT/ML injection Inject 8-28 Units into the skin See admin instructions. 28 units at breakfast. 8 units at bedtime.     levothyroxine (SYNTHROID, LEVOTHROID) 75 MCG tablet Take 75 mcg by mouth daily before breakfast.     lisinopril (PRINIVIL,ZESTRIL) 40 MG tablet Take 40 mg by mouth daily.     metoprolol succinate (TOPROL-XL) 100 MG 24 hr tablet Take  100 mg by mouth daily.     nitroGLYCERIN (NITROSTAT) 0.4 MG SL tablet SMARTSIG:1 Tablet(s) Sublingual PRN     omeprazole (PRILOSEC) 20 MG capsule Take 20 mg by mouth daily.     simvastatin (ZOCOR) 40 MG tablet Take 40 mg by mouth daily.     No current facility-administered medications for this encounter.    No Known Allergies  Social History   Socioeconomic History   Marital status: Married    Spouse name: Not on file   Number of  children: Not on file   Years of education: Not on file   Highest education level: Not on file  Occupational History   Not on file  Tobacco Use   Smoking status: Never   Smokeless tobacco: Never  Vaping Use   Vaping Use: Never used  Substance and Sexual Activity   Alcohol use: Yes    Comment: Holidays   Drug use: Never   Sexual activity: Not on file  Other Topics Concern   Not on file  Social History Narrative   Not on file   Social Determinants of Health   Financial Resource Strain: Not on file  Food Insecurity: Not on file  Transportation Needs: Not on file  Physical Activity: Not on file  Stress: Not on file  Social Connections: Not on file  Intimate Partner Violence: Not on file     ROS- All systems are reviewed and negative except as per the HPI above.  Physical Exam: Vitals:   08/10/21 1013  BP: (!) 168/80  Pulse: (!) 59  Weight: 80.3 kg  Height: 5\' 6"  (1.676 m)    GEN- The patient is a well appearing elderly male, alert and oriented x 3 today.   Head- normocephalic, atraumatic Eyes-  Sclera clear, conjunctiva pink Ears- hearing intact Oropharynx- clear Neck- supple  Lungs- Clear to ausculation bilaterally, normal work of breathing Heart- Regular rate and rhythm, no murmurs, rubs or gallops  GI- soft, NT, ND, + BS Extremities- no clubbing, cyanosis, or edema MS- no significant deformity or atrophy Skin- no rash or lesion Psych- euthymic mood, full affect Neuro- strength and sensation are intact  Wt Readings from Last 3 Encounters:  08/10/21 80.3 kg  07/12/21 83.9 kg  07/09/21 83.9 kg    EKG today demonstrates  SB, LVH with repol changes Vent. rate 59 BPM PR interval 150 ms QRS duration 94 ms QT/QTcB 414/409 ms  Echo 07/10/21 demonstrated  1. Left ventricular ejection fraction, by estimation, is 65 to 70%. The  left ventricle has normal function. The left ventricle has no regional  wall motion abnormalities. There is mild left ventricular  hypertrophy.  Left ventricular diastolic parameters are consistent with Grade I diastolic dysfunction (impaired relaxation).   2. Right ventricular systolic function is normal. The right ventricular  size is normal.   3. The mitral valve is grossly normal. No evidence of mitral valve  regurgitation.   4. The aortic valve is tricuspid. Aortic valve regurgitation is trivial.  Mild aortic valve sclerosis is present, with no evidence of aortic valve  stenosis.   Epic records are reviewed at length today  CHA2DS2-VASc Score = 5  The patient's score is based upon: CHF History: 0 HTN History: 0 Diabetes History: 1 Stroke History: 2 Vascular Disease History: 0 Age Score: 2 Gender Score: 0      ASSESSMENT AND PLAN: 1. Paroxysmal Atrial Fibrillation (ICD10:  I48.0) The patient's CHA2DS2-VASc score is 5, indicating a 7.2% annual risk of  stroke.   General education about afib provided and questions answered. We also discussed his stroke risk and the risks and benefits of anticoagulation. ILR shows 10% afib burden with overall controlled rates.  Check bmet/cbc Start Eliquis, dosing based on renal function on labs today.  Stop ASA.  2. Secondary Hypercoagulable State (ICD10:  D68.69) The patient is at significant risk for stroke/thromboembolism based upon his CHA2DS2-VASc Score of 5.  Start Apixaban (Eliquis).     Patient has an appointment in one month to establish care with cardiology at [facility] in [place]. AF clinic as needed.    [facility] [address] [place], [place] [address] [phone] 08/10/2021 10:23 AM

## 2021-08-10 NOTE — Patient Instructions (Signed)
Stop aspirin  We will call this afternoon with dose of Eliquis to start.

## 2021-08-15 LAB — CUP PACEART REMOTE DEVICE CHECK
Date Time Interrogation Session: 20221106001257
Implantable Pulse Generator Implant Date: 20221003

## 2021-08-16 ENCOUNTER — Ambulatory Visit (INDEPENDENT_AMBULATORY_CARE_PROVIDER_SITE_OTHER): Payer: Medicare HMO

## 2021-08-16 DIAGNOSIS — I480 Paroxysmal atrial fibrillation: Secondary | ICD-10-CM | POA: Diagnosis not present

## 2021-08-24 NOTE — Progress Notes (Signed)
[facility] Summary Report / Loop Recorder 

## 2021-09-17 ENCOUNTER — Ambulatory Visit (INDEPENDENT_AMBULATORY_CARE_PROVIDER_SITE_OTHER): Payer: Medicare HMO

## 2021-09-17 DIAGNOSIS — I480 Paroxysmal atrial fibrillation: Secondary | ICD-10-CM

## 2021-09-17 DIAGNOSIS — R0683 Snoring: Secondary | ICD-10-CM | POA: Diagnosis not present

## 2021-09-17 DIAGNOSIS — I6930 Unspecified sequelae of cerebral infarction: Secondary | ICD-10-CM | POA: Diagnosis not present

## 2021-09-17 DIAGNOSIS — I69319 Unspecified symptoms and signs involving cognitive functions following cerebral infarction: Secondary | ICD-10-CM | POA: Diagnosis not present

## 2021-09-17 LAB — CUP PACEART REMOTE DEVICE CHECK
Date Time Interrogation Session: 20221209000914
Implantable Pulse Generator Implant Date: 20221003

## 2021-09-21 ENCOUNTER — Inpatient Hospital Stay: Payer: Medicare HMO | Admitting: Neurology

## 2021-09-27 NOTE — Progress Notes (Signed)
[facility] Summary Report / Loop Recorder 

## 2021-10-19 LAB — CUP PACEART REMOTE DEVICE CHECK
Date Time Interrogation Session: 20230110031126
Implantable Pulse Generator Implant Date: 20221003

## 2021-10-20 ENCOUNTER — Ambulatory Visit (INDEPENDENT_AMBULATORY_CARE_PROVIDER_SITE_OTHER): Payer: Medicare HMO

## 2021-10-20 DIAGNOSIS — I480 Paroxysmal atrial fibrillation: Secondary | ICD-10-CM

## 2021-11-01 NOTE — Progress Notes (Signed)
[facility] Summary Report / Loop Recorder 

## 2021-11-18 DIAGNOSIS — G4733 Obstructive sleep apnea (adult) (pediatric): Secondary | ICD-10-CM | POA: Diagnosis not present

## 2021-11-22 ENCOUNTER — Ambulatory Visit (INDEPENDENT_AMBULATORY_CARE_PROVIDER_SITE_OTHER): Payer: Medicare HMO

## 2021-11-22 DIAGNOSIS — I480 Paroxysmal atrial fibrillation: Secondary | ICD-10-CM

## 2021-11-22 LAB — CUP PACEART REMOTE DEVICE CHECK
Date Time Interrogation Session: 20230213001103
Implantable Pulse Generator Implant Date: 20221003

## 2021-11-24 DIAGNOSIS — G4733 Obstructive sleep apnea (adult) (pediatric): Secondary | ICD-10-CM | POA: Diagnosis not present

## 2021-11-25 NOTE — Progress Notes (Signed)
[facility] Summary Report / Loop Recorder 

## 2021-11-26 ENCOUNTER — Other Ambulatory Visit: Payer: Self-pay | Admitting: Urology

## 2021-11-26 NOTE — Telephone Encounter (Signed)
Ok to fill 

## 2021-11-29 ENCOUNTER — Telehealth: Payer: Self-pay | Admitting: Urology

## 2021-11-29 NOTE — Telephone Encounter (Signed)
Please let Mr. Harts know we could not refill his sildenafil (Viagra) as the medication was discontinued after his hospital stay for his stroke.  He would need clearance from his cardiologist and neurologist prior to restarting this medication.

## 2021-11-30 DIAGNOSIS — Z794 Long term (current) use of insulin: Secondary | ICD-10-CM | POA: Diagnosis not present

## 2021-11-30 DIAGNOSIS — E1140 Type 2 diabetes mellitus with diabetic neuropathy, unspecified: Secondary | ICD-10-CM | POA: Diagnosis not present

## 2021-11-30 DIAGNOSIS — N189 Chronic kidney disease, unspecified: Secondary | ICD-10-CM | POA: Diagnosis not present

## 2021-11-30 DIAGNOSIS — E039 Hypothyroidism, unspecified: Secondary | ICD-10-CM | POA: Diagnosis not present

## 2021-11-30 DIAGNOSIS — N529 Male erectile dysfunction, unspecified: Secondary | ICD-10-CM | POA: Diagnosis not present

## 2021-11-30 DIAGNOSIS — E1122 Type 2 diabetes mellitus with diabetic chronic kidney disease: Secondary | ICD-10-CM | POA: Diagnosis not present

## 2021-11-30 DIAGNOSIS — I129 Hypertensive chronic kidney disease with stage 1 through stage 4 chronic kidney disease, or unspecified chronic kidney disease: Secondary | ICD-10-CM | POA: Diagnosis not present

## 2021-11-30 DIAGNOSIS — G4733 Obstructive sleep apnea (adult) (pediatric): Secondary | ICD-10-CM | POA: Diagnosis not present

## 2021-11-30 NOTE — Telephone Encounter (Signed)
All the numbers in chart are wrong, I've tried calling daughter as well and it's disconnected. Will wait for pt to call.

## 2021-12-06 ENCOUNTER — Other Ambulatory Visit (HOSPITAL_COMMUNITY): Payer: Self-pay | Admitting: *Deleted

## 2021-12-06 MED ORDER — APIXABAN 2.5 MG PO TABS: 2.5000 mg | ORAL_TABLET | Freq: Two times a day (BID) | ORAL | 0 refills | Status: DC

## 2021-12-08 DIAGNOSIS — M12812 Other specific arthropathies, not elsewhere classified, left shoulder: Secondary | ICD-10-CM | POA: Diagnosis not present

## 2021-12-27 ENCOUNTER — Ambulatory Visit (INDEPENDENT_AMBULATORY_CARE_PROVIDER_SITE_OTHER): Payer: Medicare HMO

## 2021-12-27 DIAGNOSIS — I480 Paroxysmal atrial fibrillation: Secondary | ICD-10-CM | POA: Diagnosis not present

## 2021-12-27 LAB — CUP PACEART REMOTE DEVICE CHECK
Date Time Interrogation Session: 20230319231418
Implantable Pulse Generator Implant Date: 20221003

## 2022-01-04 ENCOUNTER — Other Ambulatory Visit (HOSPITAL_COMMUNITY): Payer: Self-pay | Admitting: Physician Assistant

## 2022-01-06 NOTE — Progress Notes (Signed)
[facility] Summary Report / Loop Recorder 

## 2022-01-18 DIAGNOSIS — Z0100 Encounter for examination of eyes and vision without abnormal findings: Secondary | ICD-10-CM | POA: Diagnosis not present

## 2022-01-18 DIAGNOSIS — H43821 Vitreomacular adhesion, right eye: Secondary | ICD-10-CM | POA: Diagnosis not present

## 2022-01-18 DIAGNOSIS — H2511 Age-related nuclear cataract, right eye: Secondary | ICD-10-CM | POA: Diagnosis not present

## 2022-01-31 ENCOUNTER — Ambulatory Visit (INDEPENDENT_AMBULATORY_CARE_PROVIDER_SITE_OTHER): Payer: Medicare HMO

## 2022-01-31 DIAGNOSIS — I480 Paroxysmal atrial fibrillation: Secondary | ICD-10-CM

## 2022-01-31 LAB — CUP PACEART REMOTE DEVICE CHECK
Date Time Interrogation Session: 20230423041535
Implantable Pulse Generator Implant Date: 20221003

## 2022-02-02 ENCOUNTER — Telehealth: Payer: Self-pay

## 2022-02-02 NOTE — Telephone Encounter (Signed)
LINQ alert received, ongoing AF/flutter, controlled rates. AF Burden 93%, Eliquis ?Route for increased burden. ? ?Patient called, asymptomatic and denies any cardiac symptoms. Reports compliance with medications. Advised I will forward to Dr. Lovena Le for review and will call if any changes are recommended. Pt voiced understanding. ? ? ? ? ? ? ? ?

## 2022-02-07 ENCOUNTER — Other Ambulatory Visit (HOSPITAL_COMMUNITY): Payer: Self-pay | Admitting: Physician Assistant

## 2022-02-16 NOTE — Progress Notes (Signed)
[facility] Summary Report / Loop Recorder 

## 2022-03-02 ENCOUNTER — Telehealth: Payer: Self-pay

## 2022-03-02 NOTE — Telephone Encounter (Signed)
Pt last seen by afib clinic 08/2021.  At that time Pt reported he would be establishing care with [facility] cardiology in [place].  Outreach made to [facility] cardiology.  Pt has NOT established care with them.  Linq alert received.  Pt with AF burden of 96.4%, asymptomatic with controlled rates.  Will discuss with Dr. Lovena Le regarding cardiology follow up.  LINQ alert received.  14 new AF events, persistent, controlled rates Burden 96.4%, Eliquis, pt. called 4/26 asymptomatic, forwarded to Dr. Lovena Le Re-route

## 2022-03-03 DIAGNOSIS — Z125 Encounter for screening for malignant neoplasm of prostate: Secondary | ICD-10-CM | POA: Diagnosis not present

## 2022-03-03 DIAGNOSIS — E1140 Type 2 diabetes mellitus with diabetic neuropathy, unspecified: Secondary | ICD-10-CM | POA: Diagnosis not present

## 2022-03-03 DIAGNOSIS — N400 Enlarged prostate without lower urinary tract symptoms: Secondary | ICD-10-CM | POA: Diagnosis not present

## 2022-03-03 DIAGNOSIS — Z79899 Other long term (current) drug therapy: Secondary | ICD-10-CM | POA: Diagnosis not present

## 2022-03-03 DIAGNOSIS — N289 Disorder of kidney and ureter, unspecified: Secondary | ICD-10-CM | POA: Diagnosis not present

## 2022-03-03 DIAGNOSIS — Z8739 Personal history of other diseases of the musculoskeletal system and connective tissue: Secondary | ICD-10-CM | POA: Diagnosis not present

## 2022-03-03 DIAGNOSIS — D649 Anemia, unspecified: Secondary | ICD-10-CM | POA: Diagnosis not present

## 2022-03-03 DIAGNOSIS — Z0000 Encounter for general adult medical examination without abnormal findings: Secondary | ICD-10-CM | POA: Diagnosis not present

## 2022-03-03 DIAGNOSIS — I10 Essential (primary) hypertension: Secondary | ICD-10-CM | POA: Diagnosis not present

## 2022-03-03 DIAGNOSIS — E039 Hypothyroidism, unspecified: Secondary | ICD-10-CM | POA: Diagnosis not present

## 2022-03-03 DIAGNOSIS — E7800 Pure hypercholesterolemia, unspecified: Secondary | ICD-10-CM | POA: Diagnosis not present

## 2022-03-03 DIAGNOSIS — Z794 Long term (current) use of insulin: Secondary | ICD-10-CM | POA: Diagnosis not present

## 2022-03-05 LAB — CUP PACEART REMOTE DEVICE CHECK
Date Time Interrogation Session: 20230527010407
Implantable Pulse Generator Implant Date: 20221003

## 2022-03-06 ENCOUNTER — Other Ambulatory Visit (HOSPITAL_COMMUNITY): Payer: Self-pay | Admitting: Nurse Practitioner

## 2022-03-08 ENCOUNTER — Ambulatory Visit (INDEPENDENT_AMBULATORY_CARE_PROVIDER_SITE_OTHER): Payer: Medicare HMO

## 2022-03-08 DIAGNOSIS — I480 Paroxysmal atrial fibrillation: Secondary | ICD-10-CM

## 2022-03-09 DIAGNOSIS — N529 Male erectile dysfunction, unspecified: Secondary | ICD-10-CM | POA: Diagnosis not present

## 2022-03-09 DIAGNOSIS — I129 Hypertensive chronic kidney disease with stage 1 through stage 4 chronic kidney disease, or unspecified chronic kidney disease: Secondary | ICD-10-CM | POA: Diagnosis not present

## 2022-03-09 DIAGNOSIS — N189 Chronic kidney disease, unspecified: Secondary | ICD-10-CM | POA: Diagnosis not present

## 2022-03-09 DIAGNOSIS — Z1389 Encounter for screening for other disorder: Secondary | ICD-10-CM | POA: Diagnosis not present

## 2022-03-09 DIAGNOSIS — Z0000 Encounter for general adult medical examination without abnormal findings: Secondary | ICD-10-CM | POA: Diagnosis not present

## 2022-03-09 DIAGNOSIS — E1122 Type 2 diabetes mellitus with diabetic chronic kidney disease: Secondary | ICD-10-CM | POA: Diagnosis not present

## 2022-03-09 DIAGNOSIS — E039 Hypothyroidism, unspecified: Secondary | ICD-10-CM | POA: Diagnosis not present

## 2022-03-09 DIAGNOSIS — Z794 Long term (current) use of insulin: Secondary | ICD-10-CM | POA: Diagnosis not present

## 2022-03-09 DIAGNOSIS — E1142 Type 2 diabetes mellitus with diabetic polyneuropathy: Secondary | ICD-10-CM | POA: Diagnosis not present

## 2022-03-16 NOTE — Telephone Encounter (Signed)
He is welcome to follow up with me in [place], Dr. Quentin Ore in [place], a general cardiologist in [place] which could include either [facility] or one of our cardiologists.

## 2022-03-22 NOTE — Telephone Encounter (Signed)
Outreach made to Pt.  He states he has an appt this Friday with [facility] clinic and he believes this physician is his cardiologist.  Requested he call device clinic with Dr. Name and phone number to facilitate transfer of care in regards to loop recorder.   Pt has phone number for device clinic.

## 2022-03-24 NOTE — Progress Notes (Signed)
[facility] Summary Report / Loop Recorder 

## 2022-03-25 DIAGNOSIS — I10 Essential (primary) hypertension: Secondary | ICD-10-CM | POA: Diagnosis not present

## 2022-03-25 DIAGNOSIS — I6930 Unspecified sequelae of cerebral infarction: Secondary | ICD-10-CM | POA: Diagnosis not present

## 2022-03-25 DIAGNOSIS — I619 Nontraumatic intracerebral hemorrhage, unspecified: Secondary | ICD-10-CM | POA: Diagnosis not present

## 2022-03-25 DIAGNOSIS — I480 Paroxysmal atrial fibrillation: Secondary | ICD-10-CM | POA: Diagnosis not present

## 2022-03-25 DIAGNOSIS — Z950 Presence of cardiac pacemaker: Secondary | ICD-10-CM | POA: Diagnosis not present

## 2022-03-25 DIAGNOSIS — G4733 Obstructive sleep apnea (adult) (pediatric): Secondary | ICD-10-CM | POA: Diagnosis not present

## 2022-03-25 DIAGNOSIS — I69319 Unspecified symptoms and signs involving cognitive functions following cerebral infarction: Secondary | ICD-10-CM | POA: Diagnosis not present

## 2022-04-08 DIAGNOSIS — E1122 Type 2 diabetes mellitus with diabetic chronic kidney disease: Secondary | ICD-10-CM | POA: Diagnosis not present

## 2022-04-08 DIAGNOSIS — E1142 Type 2 diabetes mellitus with diabetic polyneuropathy: Secondary | ICD-10-CM | POA: Diagnosis not present

## 2022-04-08 DIAGNOSIS — I129 Hypertensive chronic kidney disease with stage 1 through stage 4 chronic kidney disease, or unspecified chronic kidney disease: Secondary | ICD-10-CM | POA: Diagnosis not present

## 2022-04-08 DIAGNOSIS — Z794 Long term (current) use of insulin: Secondary | ICD-10-CM | POA: Diagnosis not present

## 2022-04-08 DIAGNOSIS — Z8673 Personal history of transient ischemic attack (TIA), and cerebral infarction without residual deficits: Secondary | ICD-10-CM | POA: Diagnosis not present

## 2022-04-08 DIAGNOSIS — N189 Chronic kidney disease, unspecified: Secondary | ICD-10-CM | POA: Diagnosis not present

## 2022-04-10 ENCOUNTER — Other Ambulatory Visit (HOSPITAL_COMMUNITY): Payer: Self-pay | Admitting: Internal Medicine

## 2022-04-10 DIAGNOSIS — I480 Paroxysmal atrial fibrillation: Secondary | ICD-10-CM

## 2022-04-11 ENCOUNTER — Ambulatory Visit (INDEPENDENT_AMBULATORY_CARE_PROVIDER_SITE_OTHER): Payer: Medicare HMO

## 2022-04-11 DIAGNOSIS — I619 Nontraumatic intracerebral hemorrhage, unspecified: Secondary | ICD-10-CM

## 2022-04-11 NOTE — Telephone Encounter (Signed)
Prescription refill request for Eliquis received. Indication: a fib Last office visit: 08/10/21 Scr: 1.5 Age: 88 Weight: 83kg

## 2022-04-13 LAB — CUP PACEART REMOTE DEVICE CHECK
Date Time Interrogation Session: 20230705155257
Implantable Pulse Generator Implant Date: 20221003

## 2022-04-22 DIAGNOSIS — I10 Essential (primary) hypertension: Secondary | ICD-10-CM | POA: Diagnosis not present

## 2022-04-25 DIAGNOSIS — E1122 Type 2 diabetes mellitus with diabetic chronic kidney disease: Secondary | ICD-10-CM | POA: Diagnosis not present

## 2022-04-25 DIAGNOSIS — E1140 Type 2 diabetes mellitus with diabetic neuropathy, unspecified: Secondary | ICD-10-CM | POA: Diagnosis not present

## 2022-04-25 DIAGNOSIS — E1151 Type 2 diabetes mellitus with diabetic peripheral angiopathy without gangrene: Secondary | ICD-10-CM | POA: Diagnosis not present

## 2022-04-25 DIAGNOSIS — I639 Cerebral infarction, unspecified: Secondary | ICD-10-CM | POA: Diagnosis not present

## 2022-04-25 DIAGNOSIS — E7800 Pure hypercholesterolemia, unspecified: Secondary | ICD-10-CM | POA: Diagnosis not present

## 2022-04-25 DIAGNOSIS — I10 Essential (primary) hypertension: Secondary | ICD-10-CM | POA: Diagnosis not present

## 2022-04-25 DIAGNOSIS — R9431 Abnormal electrocardiogram [ECG] [EKG]: Secondary | ICD-10-CM | POA: Diagnosis not present

## 2022-04-25 DIAGNOSIS — I480 Paroxysmal atrial fibrillation: Secondary | ICD-10-CM | POA: Diagnosis not present

## 2022-04-25 DIAGNOSIS — R0602 Shortness of breath: Secondary | ICD-10-CM | POA: Diagnosis not present

## 2022-04-25 DIAGNOSIS — G4733 Obstructive sleep apnea (adult) (pediatric): Secondary | ICD-10-CM | POA: Diagnosis not present

## 2022-05-03 NOTE — Progress Notes (Signed)
[facility] Summary Report / Loop Recorder 

## 2022-05-11 DIAGNOSIS — I480 Paroxysmal atrial fibrillation: Secondary | ICD-10-CM | POA: Diagnosis not present

## 2022-05-16 ENCOUNTER — Ambulatory Visit (INDEPENDENT_AMBULATORY_CARE_PROVIDER_SITE_OTHER): Payer: Medicare HMO

## 2022-05-16 DIAGNOSIS — I480 Paroxysmal atrial fibrillation: Secondary | ICD-10-CM | POA: Diagnosis not present

## 2022-05-16 DIAGNOSIS — R0602 Shortness of breath: Secondary | ICD-10-CM | POA: Diagnosis not present

## 2022-05-17 LAB — CUP PACEART REMOTE DEVICE CHECK
Date Time Interrogation Session: 20230808074756
Implantable Pulse Generator Implant Date: 20221003

## 2022-05-18 DIAGNOSIS — Z7901 Long term (current) use of anticoagulants: Secondary | ICD-10-CM | POA: Diagnosis not present

## 2022-05-20 DIAGNOSIS — I480 Paroxysmal atrial fibrillation: Secondary | ICD-10-CM | POA: Diagnosis not present

## 2022-05-20 DIAGNOSIS — R0602 Shortness of breath: Secondary | ICD-10-CM | POA: Diagnosis not present

## 2022-05-24 DIAGNOSIS — R0602 Shortness of breath: Secondary | ICD-10-CM | POA: Diagnosis not present

## 2022-05-24 DIAGNOSIS — I10 Essential (primary) hypertension: Secondary | ICD-10-CM | POA: Diagnosis not present

## 2022-05-24 DIAGNOSIS — I480 Paroxysmal atrial fibrillation: Secondary | ICD-10-CM | POA: Diagnosis not present

## 2022-05-24 DIAGNOSIS — G4733 Obstructive sleep apnea (adult) (pediatric): Secondary | ICD-10-CM | POA: Diagnosis not present

## 2022-06-01 ENCOUNTER — Telehealth: Payer: Self-pay

## 2022-06-01 NOTE — Telephone Encounter (Signed)
Pt has established care with Dr. Nehemiah Massed at [facility] in [place].  Message sent to Dr. Nehemiah Massed advising Pt has a loop recorder and to request these transmissions to be sent to his office.  Await further needs.

## 2022-06-06 DIAGNOSIS — Z7901 Long term (current) use of anticoagulants: Secondary | ICD-10-CM | POA: Diagnosis not present

## 2022-06-09 ENCOUNTER — Other Ambulatory Visit: Payer: Self-pay | Admitting: *Deleted

## 2022-06-09 DIAGNOSIS — N402 Nodular prostate without lower urinary tract symptoms: Secondary | ICD-10-CM

## 2022-06-09 DIAGNOSIS — N401 Enlarged prostate with lower urinary tract symptoms: Secondary | ICD-10-CM

## 2022-06-09 NOTE — Addendum Note (Signed)
Addended by: Despina Hidden on: 06/09/2022 04:41 PM   Modules accepted: Orders

## 2022-06-10 ENCOUNTER — Other Ambulatory Visit: Payer: Medicare HMO

## 2022-06-10 ENCOUNTER — Encounter: Payer: Self-pay | Admitting: Urology

## 2022-06-14 DIAGNOSIS — R791 Abnormal coagulation profile: Secondary | ICD-10-CM | POA: Diagnosis not present

## 2022-06-14 NOTE — Progress Notes (Deleted)
06/15/2022  4:49 PM   Glen Martinez [birth year] [id number]  Referring provider: Tracie Harrier, MD [address] [facility] [place],  [place] [id number]  Urological history: 1.  BPH with LU TS -I PSS *** -finasteride 5 mg daily  2.  Prostate nodule -PSA (02/2022) 3.01  -1 cm nodule in the right lobe x stable on serial exams  3. ED -contributing factors of ED are age, BPH, DM, HTN, HLD and hypothyroidism -SHIM *** -Sildenafil 20 mg, on-demand-dosing  No chief complaint on file.    HPI: Glen Martinez is a 88 y.o.  male who presents today for routine 43-monthevaluation and management.   Patient denies any modifying or aggravating factors.  Patient denies any gross hematuria, dysuria or suprapubic/flank pain.  Patient denies any fevers, chills, nausea or vomiting.        Score:  1-7 Mild 8-19 Moderate 20-35 Severe  Patient still having spontaneous erections.  He denies any pain or curvature with erections.        Score: 1-7 Severe ED 8-11 Moderate ED 12-16 Mild-Moderate ED 17-21 Mild ED 22-25 No ED  PMH: Past Medical History:  Diagnosis Date   Diabetes mellitus without complication (HCC)    GERD (gastroesophageal reflux disease)    Gout    Hyperlipidemia    Hypertension    Hypothyroidism    Wears dentures    partial upper and lower    Surgical History: Past Surgical History:  Procedure Laterality Date   CATARACT EXTRACTION W/PHACO Left 05/02/2018   Procedure: CATARACT EXTRACTION PHACO AND INTRAOCULAR LENS PLACEMENT ([place]  DIABETIC;  Surgeon: BLeandrew Koyanagi MD;  Location[place] [facility]  Service: Ophthalmology;  Laterality: Left;  diabetic - insulin   COLONOSCOPY     HERNIA REPAIR     LOOP RECORDER INSERTION N/A 07/12/2021   Procedure: LOOP RECORDER INSERTION;  Surgeon: TEvans Lance MD;  [facility];  Service: Cardiovascular;  Laterality: N/A;   TONSILLECTOMY      Home Medications:   Allergies as of 06/15/2022   No Known Allergies      Medication List        Accurate as of June 14, 2022  4:49 PM. If you have any questions, ask your nurse or doctor.          allopurinol 100 MG tablet Commonly known as: ZYLOPRIM Take 100 mg by mouth daily.   B-12 1000 MCG Tabs Take 1,000 mcg by mouth daily.   diclofenac Sodium 1 % Gel Commonly known as: VOLTAREN Apply 2 g topically 4 (four) times daily.   Eliquis 2.5 MG Tabs tablet Generic drug: apixaban TAKE 1 TABLET BY MOUTH TWICE A DAY   famotidine 20 MG tablet Commonly known as: Pepcid Take 1 tablet (20 mg total) by mouth daily.   finasteride 5 MG tablet Commonly known as: PROSCAR Take 1 tablet (5 mg total) by mouth daily.   gabapentin 300 MG capsule Commonly known as: NEURONTIN Take 300 mg by mouth at bedtime.   insulin NPH-regular Human (70-30) 100 UNIT/ML injection Inject 8-28 Units into the skin See admin instructions. 28 units at breakfast. 8 units at bedtime.   levothyroxine 75 MCG tablet Commonly known as: SYNTHROID Take 75 mcg by mouth daily before breakfast.   lisinopril 40 MG tablet Commonly known as: ZESTRIL Take 40 mg by mouth daily.   metoprolol succinate 100 MG 24 hr tablet Commonly known as: TOPROL-XL Take 100 mg by mouth daily.  nitroGLYCERIN 0.4 MG SL tablet Commonly known as: NITROSTAT SMARTSIG:1 Tablet(s) Sublingual PRN   omeprazole 20 MG capsule Commonly known as: PRILOSEC Take 20 mg by mouth daily.   simvastatin 40 MG tablet Commonly known as: ZOCOR Take 40 mg by mouth daily.        Allergies: No Known Allergies  Family History: No family history on file.  Social History:  reports that he has never smoked. He has never used smokeless tobacco. He reports current alcohol use. He reports that he does not use drugs.  ROS: For pertinent review of systems please refer to history of present illness  Physical Exam: There were no vitals taken for this visit.   Constitutional:  Well nourished. Alert and oriented, No acute distress. HEENT: [place] AT, mask in place.  Trachea midline Cardiovascular: No clubbing, cyanosis, or edema. Respiratory: Normal respiratory effort, no increased work of breathing. GU: No CVA tenderness.  No bladder fullness or masses.  Patient with uncircumcised phallus.  Foreskin easily retracted  Urethral meatus is patent.  No penile discharge. No penile lesions or rashes. Scrotum without lesions, cysts, rashes and/or edema.  Testicles are located scrotally bilaterally. No masses are appreciated in the testicles. Left and right epididymis are normal. Rectal: Patient with  normal sphincter tone. Anus and perineum without scarring or rashes. No rectal masses are appreciated. Prostate is approximately 50 + grams, could only palpate the apex and midportion of gland, 1 cm rubbery nodule in the right apex.  Seminal vesicles could not be palpated  Neurologic: Grossly intact, no focal deficits, moving all 4 extremities. Psychiatric: Normal mood and affect.   Laboratory Results PSA (Prostate Specific Antigen), Total 0.10 - 4.00 ng/mL 3.01   Resulting Agency  [facility] - LAB  Narrative Performed by [facility] - LAB Test results were determined with Beckman Coulter Hybritech Assay. Values obtained with different assay methods cannot be used interchangeably in serial testing. Assay results should not be interpreted as absolute evidence of the presence or absence of malignant disease  Specimen Collected: 03/03/22 08:20 Last Resulted: 03/03/22 10:50  Received From: [facility]  Result Received: 03/06/22 20:46    Prostate Specific Ag, Serum  Latest Ref Rng 0.0 - 4.0 ng/mL  04/10/2018 2.7   11/09/2018 2.9   05/15/2019 2.7   05/26/2020 2.2   06/07/2021 2.5    WBC (White Blood Cell Count) 4.1 - 10.2 10^3/uL 8.9   RBC (Red Blood Cell Count) 4.69 - 6.13 10^6/uL 4.41 Low    Hemoglobin 14.1 - 18.1 gm/dL 13.5 Low     Hematocrit 40.0 - 52.0 % 42.2   MCV (Mean Corpuscular Volume) 80.0 - 100.0 fl 95.7   MCH (Mean Corpuscular Hemoglobin) 27.0 - 31.2 pg 30.6   MCHC (Mean Corpuscular Hemoglobin Concentration) 32.0 - 36.0 gm/dL 32.0   Platelet Count 150 - 450 10^3/uL 188   RDW-CV (Red Cell Distribution Width) 11.6 - 14.8 % 14.0   MPV (Mean Platelet Volume) 9.4 - 12.4 fl 11.3   Neutrophils 1.50 - 7.80 10^3/uL 5.40   Lymphocytes 1.00 - 3.60 10^3/uL 2.51   Monocytes 0.00 - 1.50 10^3/uL 0.71   Eosinophils 0.00 - 0.55 10^3/uL 0.18   Basophils 0.00 - 0.09 10^3/uL 0.06   Neutrophil % 32.0 - 70.0 % 60.8   Lymphocyte % 10.0 - 50.0 % 28.3   Monocyte % 4.0 - 13.0 % 8.0   Eosinophil % 1.0 - 5.0 % 2.0   Basophil% 0.0 - 2.0 %  0.7   Immature Granulocyte % <=0.7 % 0.2   Immature Granulocyte Count <=0.06 10^3/L 0.02   Resulting Agency  [facility] - LAB   Specimen Collected: 03/03/22 08:20 Last Resulted: 03/03/22 08:57  Received From: [facility]  Result Received: 03/06/22 20:46   Thyroid Stimulating Hormone (TSH) 0.450-5.330 uIU/ml uIU/mL 6.173 High    Resulting Agency  [facility] - LAB   Specimen Collected: 03/03/22 08:20 Last Resulted: 03/03/22 10:50  Received From: [facility]  Result Received: 03/06/22 20:46   Hemoglobin A1C 4.2 - 5.6 % 7.3 High    Average Blood Glucose (Calc) mg/dL [facility] - [facility]  Narrative Performed by [facility] - LAB Normal Range:    4.2 - 5.6%  Increased Risk:  5.7 - 6.4%  Diabetes:        >= 6.5%  Glycemic Control for adults with diabetes:  <7%    Specimen Collected: 03/03/22 08:20 Last Resulted: 03/03/22 10:25  Received From: [facility]  Result Received: 03/06/22 20:46   Color Yellow, Violet, Light Violet, Dark Violet Yellow   Clarity Clear, Other Clear   Specific Gravity 1.000 - 1.030 >=1.030   pH, Urine 5.0 - 8.0 5.5   Protein, Urinalysis Negative, Trace  mg/dL Negative   Glucose, Urinalysis Negative mg/dL Negative   Ketones, Urinalysis Negative mg/dL Negative   Blood, Urinalysis Negative Negative   Nitrite, Urinalysis Negative Negative   Leukocyte Esterase, Urinalysis Negative Negative   White Blood Cells, Urinalysis None Seen, 0-3 /hpf None Seen   Red Blood Cells, Urinalysis None Seen, 0-3 /hpf None Seen   Bacteria, Urinalysis None Seen /hpf None Seen   Squamous Epithelial Cells, Urinalysis Rare, Few, None Seen /hpf None Seen   Resulting Agency  [facility] - LAB   Specimen Collected: 03/03/22 08:20 Last Resulted: 03/03/22 11:33  Received From: [facility]  Result Received: 03/06/22 20:46   Cholesterol, Total 100 - 200 mg/dL 175   Triglyceride 35 - 199 mg/dL 93   HDL (High Density Lipoprotein) Cholesterol 29.0 - 71.0 mg/dL 58.3   LDL Calculated 0 - 130 mg/dL 98   VLDL Cholesterol mg/dL 19   Cholesterol/HDL Ratio  3.0   Resulting Agency  [facility] - LAB   Specimen Collected: 03/03/22 08:20 Last Resulted: 03/03/22 10:00  Received From: [facility]  Result Received: 03/06/22 20:46   Glucose 70 - 110 mg/dL 118 High    Sodium 136 - 145 mmol/L 143   Potassium 3.6 - 5.1 mmol/L 3.5 Low    Chloride 97 - 109 mmol/L 108   Carbon Dioxide (CO2) 22.0 - 32.0 mmol/L 28.2   Urea Nitrogen (BUN) 7 - 25 mg/dL 20   Creatinine 0.7 - 1.3 mg/dL 1.5 High    Glomerular Filtration Rate (eGFR), MDRD Estimate >60 mL/min/1.73sq m 54 Low    Calcium 8.7 - 10.3 mg/dL 8.8   AST  8 - 39 U/L 18   ALT  6 - 57 U/L 17   Alk Phos (alkaline Phosphatase) 34 - 104 U/L 90   Albumin 3.5 - 4.8 g/dL 4.0   Bilirubin, Total 0.3 - 1.2 mg/dL 0.5   Protein, Total 6.1 - 7.9 g/dL 6.6   A/G Ratio 1.0 - 5.0 gm/dL 1.5   Resulting Agency  [facility] - LAB   Specimen Collected: 03/03/22 08:20 Last Resulted: 03/03/22 10:00  Received From: [facility]  Result Received: 03/06/22 20:46  I have  reviewed the labs.   Assessment & Plan:    1. BPH with LUTS -PSA stable -UA benign -PVR < 300 cc -symptoms - *** -most bothersome symptoms are *** -continue conservative management, avoiding bladder irritants and timed voiding's -Initiate alpha-blocker (***), discussed side effects *** -Initiate 5 alpha reductase inhibitor (***), discussed side effects *** -Continue finasteride 5 mg daily -Cannot tolerate medication or medication failure, schedule cystoscopy ***    2. Prostate cancer screening -discussed AUA guidelines (01/2022) regarding not screening for prostate cancer in men w/ a less than 10 years life expectancy  -Per social security administration data gentleman older than 86 years of age have less than a 10-year life expectancy at this time -Treatment of prostate cancer at this stage in life would pose more risk versus benefit  3. Erectile dysfunction -continue sildenafil 20 mg, on-demand-dosing  No follow-ups on file.  [facility]ica Audia, [place] [address], [place], [place] [address] [phone]

## 2022-06-15 ENCOUNTER — Ambulatory Visit: Payer: Medicare HMO | Admitting: Urology

## 2022-06-16 NOTE — Progress Notes (Signed)
[facility] Summary Report / Loop Recorder 

## 2022-06-20 ENCOUNTER — Encounter: Payer: Self-pay | Admitting: Urology

## 2022-06-20 ENCOUNTER — Ambulatory Visit (INDEPENDENT_AMBULATORY_CARE_PROVIDER_SITE_OTHER): Payer: Medicare HMO

## 2022-06-20 DIAGNOSIS — I619 Nontraumatic intracerebral hemorrhage, unspecified: Secondary | ICD-10-CM | POA: Diagnosis not present

## 2022-06-21 ENCOUNTER — Other Ambulatory Visit: Payer: Medicare HMO

## 2022-06-21 DIAGNOSIS — N402 Nodular prostate without lower urinary tract symptoms: Secondary | ICD-10-CM | POA: Diagnosis not present

## 2022-06-21 DIAGNOSIS — N138 Other obstructive and reflux uropathy: Secondary | ICD-10-CM | POA: Diagnosis not present

## 2022-06-21 DIAGNOSIS — N401 Enlarged prostate with lower urinary tract symptoms: Secondary | ICD-10-CM

## 2022-06-21 DIAGNOSIS — Z7901 Long term (current) use of anticoagulants: Secondary | ICD-10-CM | POA: Diagnosis not present

## 2022-06-22 LAB — CUP PACEART REMOTE DEVICE CHECK
Date Time Interrogation Session: 20230913091304
Implantable Pulse Generator Implant Date: 20221003

## 2022-06-22 LAB — PSA: Prostate Specific Ag, Serum: 2.6 ng/mL (ref 0.0–4.0)

## 2022-06-27 NOTE — Progress Notes (Unsigned)
06/28/2022  2:13 PM   Glen Martinez 12-21-1930 [id number]  Referring provider: Tracie Harrier, MD [address] [facility] [place],  [place] [address]  Urological history: 1.  BPH with LU TS -I PSS 1/0 -finasteride 5 mg daily  2.  Prostate nodule -PSA (06/2022) 2.6 - corrected  5.2 -1 cm nodule in the right lobe x stable on serial exams  3. ED -contributing factors of ED are age, BPH, DM, HTN, HLD and hypothyroidism -SHIM 15 -Sildenafil 20 mg, on-demand-dosing  Chief Complaint  Patient presents with   Benign Prostatic Hypertrophy   Erectile Dysfunction   HPI: Glen Martinez is a 88 y.o.  male who presents today for routine 41-monthevaluation and management.   He has no urinary complaints at this visit.  Patient denies any modifying or aggravating factors.  Patient denies any gross hematuria, dysuria or suprapubic/flank pain.  Patient denies any fevers, chills, nausea or vomiting.     IPSS     Row Name 06/28/22 1300         International Prostate Symptom Score   How often have you had the sensation of not emptying your bladder? Not at All     How often have you had to urinate less than every two hours? Not at All     How often have you found you stopped and started again several times when you urinated? Less than 1 in 5 times     How often have you found it difficult to postpone urination? Not at All     How often have you had a weak urinary stream? Not at All     How often have you had to strain to start urination? Not at All     How many times did you typically get up at night to urinate? None     Total IPSS Score 1       Quality of Life due to urinary symptoms   If you were to spend the rest of your life with your urinary condition just the way it is now how would you feel about that? Delighted                Score:  1-7 Mild 8-19 Moderate 20-35 Severe  Patient is not having spontaneous erections.  He denies any pain or  curvature with erections.  Viagra was not effective.  His wife is very sick, so he is not interested in sex at this time.    SHIM     Row Name 06/28/22 1351         SHIM: Over the last 6 months:   How do you rate your confidence that you could get and keep an erection? Very Low     When you had erections with sexual stimulation, how often were your erections hard enough for penetration (entering your partner)? Sometimes (about half the time)     During sexual intercourse, how often were you able to maintain your erection after you had penetrated (entered) your partner? Sometimes (about half the time)     During sexual intercourse, how difficult was it to maintain your erection to completion of intercourse? Slightly Difficult     When you attempted sexual intercourse, how often was it satisfactory for you? Most Times (much more than half the time)       SHIM Total Score   SHIM 15  Score: 1-7 Severe ED 8-11 Moderate ED 12-16 Mild-Moderate ED 17-21 Mild ED 22-25 No ED  PMH: Past Medical History:  Diagnosis Date   Diabetes mellitus without complication (HCC)    GERD (gastroesophageal reflux disease)    Gout    Hyperlipidemia    Hypertension    Hypothyroidism    Wears dentures    partial upper and lower    Surgical History: Past Surgical History:  Procedure Laterality Date   CATARACT EXTRACTION W/PHACO Left 05/02/2018   Procedure: CATARACT EXTRACTION PHACO AND INTRAOCULAR LENS PLACEMENT (IOC) DIABETIC;  Surgeon: Brasington, Chadwick, MD;  Location: [facility];  Service: Ophthalmology;  Laterality: Left;  diabetic - insulin   COLONOSCOPY     HERNIA REPAIR     LOOP RECORDER INSERTION N/A 07/12/2021   Procedure: LOOP RECORDER INSERTION;  Surgeon: Taylor, Gregg W, MD;  Location: [facility];  Service: Cardiovascular;  Laterality: N/A;   TONSILLECTOMY      Home Medications:  Allergies as of 06/28/2022   No Known Allergies       Medication List        Accurate as of June 28, 2022  2:13 PM. If you have any questions, ask your nurse or doctor.          allopurinol 100 MG tablet Commonly known as: ZYLOPRIM Take 100 mg by mouth daily.   amLODipine 5 MG tablet Commonly known as: NORVASC Take 5 mg by mouth daily.   B-12 1000 MCG Tabs Take 1,000 mcg by mouth daily.   diclofenac Sodium 1 % Gel Commonly known as: VOLTAREN Apply 2 g topically 4 (four) times daily.   Eliquis 2.5 MG Tabs tablet Generic drug: apixaban TAKE 1 TABLET BY MOUTH TWICE A DAY   famotidine 20 MG tablet Commonly known as: Pepcid Take 1 tablet (20 mg total) by mouth daily.   finasteride 5 MG tablet Commonly known as: PROSCAR Take 1 tablet (5 mg total) by mouth daily.   gabapentin 300 MG capsule Commonly known as: NEURONTIN Take 300 mg by mouth at bedtime.   insulin NPH-regular Human (70-30) 100 UNIT/ML injection Inject 8-28 Units into the skin See admin instructions. 28 units at breakfast. 8 units at bedtime.   levothyroxine 75 MCG tablet Commonly known as: SYNTHROID Take 75 mcg by mouth daily before breakfast.   lisinopril 40 MG tablet Commonly known as: ZESTRIL Take 40 mg by mouth daily.   metoprolol succinate 100 MG 24 hr tablet Commonly known as: TOPROL-XL Take 100 mg by mouth daily.   nitroGLYCERIN 0.4 MG SL tablet Commonly known as: NITROSTAT SMARTSIG:1 Tablet(s) Sublingual PRN   omeprazole 20 MG capsule Commonly known as: PRILOSEC Take 20 mg by mouth daily.   simvastatin 40 MG tablet Commonly known as: ZOCOR Take 40 mg by mouth daily.   warfarin 6 MG tablet Commonly known as: COUMADIN Take 6 mg by mouth daily.        Allergies: No Known Allergies  Family History: No family history on file.  Social History:  reports that he has never smoked. He has never used smokeless tobacco. He reports current alcohol use. He reports that he does not use drugs.  ROS: For pertinent review of systems  please refer to history of present illness  Physical Exam: BP 135/77   Pulse 75   Ht 5' 6" (1.676 m)   Wt 177 lb (80.3 kg)   BMI 28.57 kg/m   Constitutional:  Well nourished. Alert and oriented, No acute   distress. HEENT:  AT, moist mucus membranes.  Trachea midline Cardiovascular: No clubbing, cyanosis, or edema. Respiratory: Normal respiratory effort, no increased work of breathing. Neurologic: Grossly intact, no focal deficits, moving all 4 extremities. Psychiatric: Normal mood and affect.   Laboratory Results Component     Latest Ref Rng 06/21/2022  Prostate Specific Ag, Serum     0.0 - 4.0 ng/mL 2.6      Prostate Specific Ag, Serum  Latest Ref Rng 0.0 - 4.0 ng/mL  04/10/2018 2.7   11/09/2018 2.9   05/15/2019 2.7   05/26/2020 2.2   06/07/2021 2.5    WBC (White Blood Cell Count) 4.1 - 10.2 10^3/uL 8.9   RBC (Red Blood Cell Count) 4.69 - 6.13 10^6/uL 4.41 Low    Hemoglobin 14.1 - 18.1 gm/dL 13.5 Low    Hematocrit 40.0 - 52.0 % 42.2   MCV (Mean Corpuscular Volume) 80.0 - 100.0 fl 95.7   MCH (Mean Corpuscular Hemoglobin) 27.0 - 31.2 pg 30.6   MCHC (Mean Corpuscular Hemoglobin Concentration) 32.0 - 36.0 gm/dL 32.0   Platelet Count 150 - 450 10^3/uL 188   RDW-CV (Red Cell Distribution Width) 11.6 - 14.8 % 14.0   MPV (Mean Platelet Volume) 9.4 - 12.4 fl 11.3   Neutrophils 1.50 - 7.80 10^3/uL 5.40   Lymphocytes 1.00 - 3.60 10^3/uL 2.51   Monocytes 0.00 - 1.50 10^3/uL 0.71   Eosinophils 0.00 - 0.55 10^3/uL 0.18   Basophils 0.00 - 0.09 10^3/uL 0.06   Neutrophil % 32.0 - 70.0 % 60.8   Lymphocyte % 10.0 - 50.0 % 28.3   Monocyte % 4.0 - 13.0 % 8.0   Eosinophil % 1.0 - 5.0 % 2.0   Basophil% 0.0 - 2.0 % 0.7   Immature Granulocyte % <=0.7 % 0.2   Immature Granulocyte Count <=0.06 10^3/L 0.02   Resulting Agency  [facility]   Specimen Collected: 03/03/22 08:20 Last Resulted: 03/03/22 08:57  Received From: [facility]  Result Received: 03/06/22  20:46   Thyroid Stimulating Hormone (TSH) 0.450-5.330 uIU/ml uIU/mL 6.173 High    Resulting Agency  [facility]   Specimen Collected: 03/03/22 08:20 Last Resulted: 03/03/22 10:50  Received From: [facility]  Result Received: 03/06/22 20:46   Hemoglobin A1C 4.2 - 5.6 % 7.3 High    Average Blood Glucose (Calc) mg/dL 163   Resulting Agency  [facility]  Narrative Performed by [facility] Normal Range:    4.2 - 5.6%  Increased Risk:  5.7 - 6.4%  Diabetes:        >= 6.5%  Glycemic Control for adults with diabetes:  <7%    Specimen Collected: 03/03/22 08:20 Last Resulted: 03/03/22 10:25  Received From: [facility]  Result Received: 03/06/22 20:46   Color Yellow, Violet, Light Violet, Dark Violet Yellow   Clarity Clear, Other Clear   Specific Gravity 1.000 - 1.030 >=1.030   pH, Urine 5.0 - 8.0 5.5   Protein, Urinalysis Negative, Trace mg/dL Negative   Glucose, Urinalysis Negative mg/dL Negative   Ketones, Urinalysis Negative mg/dL Negative   Blood, Urinalysis Negative Negative   Nitrite, Urinalysis Negative Negative   Leukocyte Esterase, Urinalysis Negative Negative   White Blood Cells, Urinalysis None Seen, 0-3 /hpf None Seen   Red Blood Cells, Urinalysis None Seen, 0-3 /hpf None Seen   Bacteria, Urinalysis None Seen /hpf None Seen   Squamous Epithelial Cells, Urinalysis Rare,   Few, None Seen /hpf None Seen   Resulting Agency  [facility]   Specimen Collected: 03/03/22 08:20 Last Resulted: 03/03/22 11:33  Received From: [facility]  Result Received: 03/06/22 20:46   Cholesterol, Total 100 - 200 mg/dL 175   Triglyceride 35 - 199 mg/dL 93   HDL (High Density Lipoprotein) Cholesterol 29.0 - 71.0 mg/dL 58.3   LDL Calculated 0 - 130 mg/dL 98   VLDL Cholesterol mg/dL 19   Cholesterol/HDL Ratio  3.0   Resulting Agency  [facility]   Specimen Collected:  03/03/22 08:20 Last Resulted: 03/03/22 10:00  Received From: [facility]  Result Received: 03/06/22 20:46   Glucose 70 - 110 mg/dL 118 High    Sodium 136 - 145 mmol/L 143   Potassium 3.6 - 5.1 mmol/L 3.5 Low    Chloride 97 - 109 mmol/L 108   Carbon Dioxide (CO2) 22.0 - 32.0 mmol/L 28.2   Urea Nitrogen (BUN) 7 - 25 mg/dL 20   Creatinine 0.7 - 1.3 mg/dL 1.5 High    Glomerular Filtration Rate (eGFR), MDRD Estimate >60 mL/min/1.73sq m 54 Low    Calcium 8.7 - 10.3 mg/dL 8.8   AST  8 - 39 U/L 18   ALT  6 - 57 U/L 17   Alk Phos (alkaline Phosphatase) 34 - 104 U/L 90   Albumin 3.5 - 4.8 g/dL 4.0   Bilirubin, Total 0.3 - 1.2 mg/dL 0.5   Protein, Total 6.1 - 7.9 g/dL 6.6   A/G Ratio 1.0 - 5.0 gm/dL 1.5   Resulting Agency  [facility]   Specimen Collected: 03/03/22 08:20 Last Resulted: 03/03/22 10:00  Received From: [facility] [employer]  Result Received: 03/06/22 20:46  I have reviewed the labs.   Assessment & Plan:    1. BPH with LUTS -PSA stable -UA benign -continue conservative management, avoiding bladder irritants and timed voiding's -Continue finasteride 5 mg daily  -2. Prostate cancer screening -discussed AUA guidelines (01/2022) regarding not screening for prostate cancer in men w/ a less than 10 years life expectancy  -Per social security administration data gentleman older than 86 years of age have less than a 10-year life expectancy at this time -Treatment of prostate cancer at this stage in life would pose more risk versus benefit -we will no longer be screening for prostate cancer  3. Erectile dysfunction -not sexually active at this time   Return in about 1 year (around 06/29/2023) for I PSS, SHIM.  Alyjah Lovingood, [place] [address] [place], [place] [address] [phone]

## 2022-06-28 ENCOUNTER — Encounter: Payer: Self-pay | Admitting: Urology

## 2022-06-28 ENCOUNTER — Ambulatory Visit: Payer: Medicare HMO | Admitting: Urology

## 2022-06-28 VITALS — BP 135/77 | HR 75 | Ht 66.0 in | Wt 177.0 lb

## 2022-06-28 DIAGNOSIS — N529 Male erectile dysfunction, unspecified: Secondary | ICD-10-CM | POA: Diagnosis not present

## 2022-06-28 DIAGNOSIS — N138 Other obstructive and reflux uropathy: Secondary | ICD-10-CM | POA: Diagnosis not present

## 2022-06-28 DIAGNOSIS — N401 Enlarged prostate with lower urinary tract symptoms: Secondary | ICD-10-CM | POA: Diagnosis not present

## 2022-06-28 DIAGNOSIS — N402 Nodular prostate without lower urinary tract symptoms: Secondary | ICD-10-CM

## 2022-06-28 MED ORDER — FINASTERIDE 5 MG PO TABS: 5.0000 mg | ORAL_TABLET | Freq: Every day | ORAL | 3 refills | Status: AC

## 2022-07-07 NOTE — Progress Notes (Signed)
[facility] Summary Report / Loop Recorder 

## 2022-07-11 DIAGNOSIS — I480 Paroxysmal atrial fibrillation: Secondary | ICD-10-CM | POA: Diagnosis not present

## 2022-07-11 DIAGNOSIS — G4733 Obstructive sleep apnea (adult) (pediatric): Secondary | ICD-10-CM | POA: Diagnosis not present

## 2022-07-11 DIAGNOSIS — I10 Essential (primary) hypertension: Secondary | ICD-10-CM | POA: Diagnosis not present

## 2022-07-11 DIAGNOSIS — Z794 Long term (current) use of insulin: Secondary | ICD-10-CM | POA: Diagnosis not present

## 2022-07-11 DIAGNOSIS — N528 Other male erectile dysfunction: Secondary | ICD-10-CM | POA: Diagnosis not present

## 2022-07-11 DIAGNOSIS — Z7901 Long term (current) use of anticoagulants: Secondary | ICD-10-CM | POA: Diagnosis not present

## 2022-07-11 DIAGNOSIS — N1831 Chronic kidney disease, stage 3a: Secondary | ICD-10-CM | POA: Diagnosis not present

## 2022-07-11 DIAGNOSIS — E1140 Type 2 diabetes mellitus with diabetic neuropathy, unspecified: Secondary | ICD-10-CM | POA: Diagnosis not present

## 2022-07-11 DIAGNOSIS — E039 Hypothyroidism, unspecified: Secondary | ICD-10-CM | POA: Diagnosis not present

## 2022-07-20 DIAGNOSIS — R791 Abnormal coagulation profile: Secondary | ICD-10-CM | POA: Diagnosis not present

## 2022-07-20 DIAGNOSIS — I10 Essential (primary) hypertension: Secondary | ICD-10-CM | POA: Diagnosis not present

## 2022-07-20 DIAGNOSIS — E1122 Type 2 diabetes mellitus with diabetic chronic kidney disease: Secondary | ICD-10-CM | POA: Diagnosis not present

## 2022-07-20 DIAGNOSIS — Z23 Encounter for immunization: Secondary | ICD-10-CM | POA: Diagnosis not present

## 2022-07-20 DIAGNOSIS — G4733 Obstructive sleep apnea (adult) (pediatric): Secondary | ICD-10-CM | POA: Diagnosis not present

## 2022-07-20 DIAGNOSIS — M109 Gout, unspecified: Secondary | ICD-10-CM | POA: Diagnosis not present

## 2022-07-20 DIAGNOSIS — D631 Anemia in chronic kidney disease: Secondary | ICD-10-CM | POA: Diagnosis not present

## 2022-07-20 DIAGNOSIS — E039 Hypothyroidism, unspecified: Secondary | ICD-10-CM | POA: Diagnosis not present

## 2022-07-20 DIAGNOSIS — N400 Enlarged prostate without lower urinary tract symptoms: Secondary | ICD-10-CM | POA: Diagnosis not present

## 2022-07-20 DIAGNOSIS — I480 Paroxysmal atrial fibrillation: Secondary | ICD-10-CM | POA: Diagnosis not present

## 2022-07-20 DIAGNOSIS — N189 Chronic kidney disease, unspecified: Secondary | ICD-10-CM | POA: Diagnosis not present

## 2022-07-20 DIAGNOSIS — I129 Hypertensive chronic kidney disease with stage 1 through stage 4 chronic kidney disease, or unspecified chronic kidney disease: Secondary | ICD-10-CM | POA: Diagnosis not present

## 2022-07-20 DIAGNOSIS — Z794 Long term (current) use of insulin: Secondary | ICD-10-CM | POA: Diagnosis not present

## 2022-07-25 ENCOUNTER — Ambulatory Visit (INDEPENDENT_AMBULATORY_CARE_PROVIDER_SITE_OTHER): Payer: Medicare HMO

## 2022-07-25 DIAGNOSIS — I619 Nontraumatic intracerebral hemorrhage, unspecified: Secondary | ICD-10-CM

## 2022-07-25 LAB — CUP PACEART REMOTE DEVICE CHECK
Date Time Interrogation Session: 20231010085716
Implantable Pulse Generator Implant Date: 20221003

## 2022-07-28 DIAGNOSIS — Z7901 Long term (current) use of anticoagulants: Secondary | ICD-10-CM | POA: Diagnosis not present

## 2022-07-28 NOTE — Progress Notes (Signed)
[facility] Summary Report / Loop Recorder 

## 2022-08-02 DIAGNOSIS — I480 Paroxysmal atrial fibrillation: Secondary | ICD-10-CM | POA: Diagnosis not present

## 2022-08-09 DIAGNOSIS — R791 Abnormal coagulation profile: Secondary | ICD-10-CM | POA: Diagnosis not present

## 2022-08-15 DIAGNOSIS — H43822 Vitreomacular adhesion, left eye: Secondary | ICD-10-CM | POA: Diagnosis not present

## 2022-08-16 DIAGNOSIS — Z7901 Long term (current) use of anticoagulants: Secondary | ICD-10-CM | POA: Diagnosis not present

## 2022-08-23 DIAGNOSIS — R791 Abnormal coagulation profile: Secondary | ICD-10-CM | POA: Diagnosis not present

## 2022-08-29 ENCOUNTER — Ambulatory Visit (INDEPENDENT_AMBULATORY_CARE_PROVIDER_SITE_OTHER): Payer: Medicare HMO

## 2022-08-29 DIAGNOSIS — I480 Paroxysmal atrial fibrillation: Secondary | ICD-10-CM

## 2022-08-30 LAB — CUP PACEART REMOTE DEVICE CHECK
Date Time Interrogation Session: 20231121124132
Implantable Pulse Generator Implant Date: 20221003

## 2022-09-06 DIAGNOSIS — R791 Abnormal coagulation profile: Secondary | ICD-10-CM | POA: Diagnosis not present

## 2022-09-13 DIAGNOSIS — H2511 Age-related nuclear cataract, right eye: Secondary | ICD-10-CM | POA: Diagnosis not present

## 2022-09-14 DIAGNOSIS — Z7901 Long term (current) use of anticoagulants: Secondary | ICD-10-CM | POA: Diagnosis not present

## 2022-09-15 ENCOUNTER — Encounter: Payer: Self-pay | Admitting: Ophthalmology

## 2022-09-19 NOTE — Discharge Instructions (Signed)

## 2022-09-21 ENCOUNTER — Ambulatory Visit
Admission: RE | Admit: 2022-09-21 | Discharge: 2022-09-21 | Disposition: A | Discharge status: Home / Self Care | Payer: Medicare HMO | Attending: Ophthalmology | Admitting: Ophthalmology

## 2022-09-21 ENCOUNTER — Ambulatory Visit: Payer: Medicare HMO | Admitting: Anesthesiology

## 2022-09-21 ENCOUNTER — Encounter
Admission: RE | Disposition: A | Discharge status: Home / Self Care | Payer: Self-pay | Source: Home / Self Care | Attending: Ophthalmology

## 2022-09-21 ENCOUNTER — Other Ambulatory Visit: Payer: Self-pay

## 2022-09-21 ENCOUNTER — Encounter: Payer: Self-pay | Admitting: Ophthalmology

## 2022-09-21 DIAGNOSIS — K219 Gastro-esophageal reflux disease without esophagitis: Secondary | ICD-10-CM | POA: Insufficient documentation

## 2022-09-21 DIAGNOSIS — E119 Type 2 diabetes mellitus without complications: Secondary | ICD-10-CM | POA: Diagnosis not present

## 2022-09-21 DIAGNOSIS — E039 Hypothyroidism, unspecified: Secondary | ICD-10-CM | POA: Diagnosis not present

## 2022-09-21 DIAGNOSIS — H2511 Age-related nuclear cataract, right eye: Secondary | ICD-10-CM | POA: Insufficient documentation

## 2022-09-21 DIAGNOSIS — Z8673 Personal history of transient ischemic attack (TIA), and cerebral infarction without residual deficits: Secondary | ICD-10-CM | POA: Diagnosis not present

## 2022-09-21 DIAGNOSIS — I10 Essential (primary) hypertension: Secondary | ICD-10-CM | POA: Diagnosis not present

## 2022-09-21 DIAGNOSIS — E1136 Type 2 diabetes mellitus with diabetic cataract: Secondary | ICD-10-CM | POA: Diagnosis not present

## 2022-09-21 HISTORY — DX: Paroxysmal atrial fibrillation: I48.0

## 2022-09-21 HISTORY — PX: CATARACT EXTRACTION W/PHACO: SHX586

## 2022-09-21 LAB — GLUCOSE, CAPILLARY: Glucose-Capillary: 88 mg/dL (ref 70–99)

## 2022-09-21 SURGERY — PHACOEMULSIFICATION, CATARACT, WITH IOL INSERTION
Anesthesia: Monitor Anesthesia Care | Site: Eye | Laterality: Right

## 2022-09-21 MED ORDER — TETRACAINE HCL 0.5 % OP SOLN
1.0000 [drp] | OPHTHALMIC | Status: DC | PRN
  Administered 2022-09-21 (×3): 1 [drp] via OPHTHALMIC

## 2022-09-21 MED ORDER — ONDANSETRON HCL 4 MG/2ML IJ SOLN: 4.0000 mg | Freq: Once | INTRAMUSCULAR | Status: DC | PRN

## 2022-09-21 MED ORDER — SIGHTPATH DOSE#1 NA HYALUR & NA CHOND-NA HYALUR IO KIT
PACK | INTRAOCULAR | Status: DC | PRN
  Administered 2022-09-21: 1 via OPHTHALMIC

## 2022-09-21 MED ORDER — CEFUROXIME OPHTHALMIC INJECTION 1 MG/0.1 ML
INJECTION | OPHTHALMIC | Status: DC | PRN
  Administered 2022-09-21: .1 mL via INTRACAMERAL

## 2022-09-21 MED ORDER — SIGHTPATH DOSE#1 BSS IO SOLN
INTRAOCULAR | Status: DC | PRN
  Administered 2022-09-21: 62 mL via OPHTHALMIC

## 2022-09-21 MED ORDER — BRIMONIDINE TARTRATE-TIMOLOL 0.2-0.5 % OP SOLN
OPHTHALMIC | Status: DC | PRN
  Administered 2022-09-21: 1 [drp] via OPHTHALMIC

## 2022-09-21 MED ORDER — FENTANYL CITRATE (PF) 100 MCG/2ML IJ SOLN
INTRAMUSCULAR | Status: DC | PRN
  Administered 2022-09-21: 100 ug via INTRAVENOUS

## 2022-09-21 MED ORDER — MIDAZOLAM HCL 2 MG/2ML IJ SOLN
INTRAMUSCULAR | Status: DC | PRN
  Administered 2022-09-21: 2 mg via INTRAVENOUS

## 2022-09-21 MED ORDER — SIGHTPATH DOSE#1 BSS IO SOLN
INTRAOCULAR | Status: DC | PRN
  Administered 2022-09-21: 2 mL

## 2022-09-21 MED ORDER — FENTANYL CITRATE PF 50 MCG/ML IJ SOSY: 25.0000 ug | PREFILLED_SYRINGE | INTRAMUSCULAR | Status: DC | PRN

## 2022-09-21 MED ORDER — LACTATED RINGERS IV SOLN: INTRAVENOUS | Status: DC

## 2022-09-21 MED ORDER — SIGHTPATH DOSE#1 BSS IO SOLN
INTRAOCULAR | Status: DC | PRN
  Administered 2022-09-21: 15 mL via INTRAOCULAR

## 2022-09-21 MED ORDER — ARMC OPHTHALMIC DILATING DROPS
1.0000 | OPHTHALMIC | Status: DC | PRN
  Administered 2022-09-21 (×3): 1 via OPHTHALMIC

## 2022-09-21 SURGICAL SUPPLY — 14 items
CANNULA ANT/CHMB 27G (MISCELLANEOUS) IMPLANT
CANNULA ANT/CHMB 27GA (MISCELLANEOUS) IMPLANT
CATARACT SUITE SIGHTPATH (MISCELLANEOUS) ×1 IMPLANT
FEE CATARACT SUITE SIGHTPATH (MISCELLANEOUS) ×1 IMPLANT
GLOVE SRG 8 PF TXTR STRL LF DI (GLOVE) ×1 IMPLANT
GLOVE SURG ENC TEXT LTX SZ7.5 (GLOVE) ×1 IMPLANT
GLOVE SURG UNDER POLY LF SZ8 (GLOVE) ×1
LENS IOL TECNIS EYHANCE 19.5 (Intraocular Lens) IMPLANT
NDL FILTER BLUNT 18X1 1/2 (NEEDLE) ×1 IMPLANT
NEEDLE FILTER BLUNT 18X1 1/2 (NEEDLE) ×1 IMPLANT
SUT ETHILON 10-0 CS-B-6CS-B-6 (SUTURE)
SUTURE EHLN 10-0 CS-B-6CS-B-6 (SUTURE) IMPLANT
SYR 3ML LL SCALE MARK (SYRINGE) ×1 IMPLANT
WATER STERILE IRR 250ML POUR (IV SOLUTION) ×1 IMPLANT

## 2022-09-21 NOTE — Transfer of Care (Signed)
Immediate Anesthesia Transfer of Care Note  Patient: Glen Martinez  Procedure(s) Performed: CATARACT EXTRACTION PHACO AND INTRAOCULAR LENS PLACEMENT (IOC) RIGHT 12.41 01:15.0 (Right: Eye)  Patient Location: PACU  Anesthesia Type: MAC  Level of Consciousness: awake, alert  and patient cooperative  Airway and Oxygen Therapy: Patient Spontanous Breathing and Patient connected to supplemental oxygen  Post-op Assessment: Post-op Vital signs reviewed, Patient's Cardiovascular Status Stable, Respiratory Function Stable, Patent Airway and No signs of Nausea or vomiting  Post-op Vital Signs: Reviewed and stable  Complications: No notable events documented.

## 2022-09-21 NOTE — Anesthesia Preprocedure Evaluation (Signed)
Anesthesia Evaluation  Patient identified by MRN, date of birth, ID band Patient awake    Reviewed: Allergy & Precautions, H&P , NPO status , Patient's Chart, lab work & pertinent test results, reviewed documented beta blocker date and time   Airway Mallampati: II  TM Distance: >3 FB Neck ROM: full    Dental no notable dental hx. (+) Teeth Intact   Pulmonary neg pulmonary ROS   Pulmonary exam normal breath sounds clear to auscultation       Cardiovascular Exercise Tolerance: Good hypertension, Pt. on medications negative cardio ROS  Rhythm:regular Rate:Normal     Neuro/Psych CVA  negative psych ROS   GI/Hepatic negative GI ROS, Neg liver ROS,GERD  ,,  Endo/Other  diabetesHypothyroidism    Renal/GU Renal disease     Musculoskeletal   Abdominal   Peds  Hematology negative hematology ROS (+)   Anesthesia Other Findings   Reproductive/Obstetrics negative OB ROS                             Anesthesia Physical Anesthesia Plan  ASA: 3  Anesthesia Plan: MAC   Post-op Pain Management:    Induction:   PONV Risk Score and Plan:   Airway Management Planned:   Additional Equipment:   Intra-op Plan:   Post-operative Plan:   Informed Consent: I have reviewed the patients History and Physical, chart, labs and discussed the procedure including the risks, benefits and alternatives for the proposed anesthesia with the patient or authorized representative who has indicated his/her understanding and acceptance.       Plan Discussed with: CRNA  Anesthesia Plan Comments:        Anesthesia Quick Evaluation

## 2022-09-21 NOTE — Anesthesia Postprocedure Evaluation (Signed)
Anesthesia Post Note  Patient: Glen Martinez  Procedure(s) Performed: CATARACT EXTRACTION PHACO AND INTRAOCULAR LENS PLACEMENT (IOC) RIGHT 12.41 01:15.0 (Right: Eye)  Patient location during evaluation: PACU Anesthesia Type: MAC Level of consciousness: awake and alert Pain management: pain level controlled Vital Signs Assessment: post-procedure vital signs reviewed and stable Respiratory status: spontaneous breathing, nonlabored ventilation, respiratory function stable and patient connected to nasal cannula oxygen Cardiovascular status: blood pressure returned to baseline and stable Postop Assessment: no apparent nausea or vomiting Anesthetic complications: no   No notable events documented.   Last Vitals:  Vitals:   09/21/22 1201 09/21/22 1209  BP: 137/85 135/82  Pulse: (!) 53 74  Resp: 15 (!) 22  Temp: (!) 36.3 C (!) 36.3 C  SpO2: 100% 97%    Last Pain:  Vitals:   09/21/22 1209  TempSrc:   PainSc: 0-No pain                 Molli Barrows

## 2022-09-21 NOTE — Op Note (Signed)
LOCATION:  [facility]   PREOPERATIVE DIAGNOSIS:    Nuclear sclerotic cataract right eye. H25.11   POSTOPERATIVE DIAGNOSIS:  Nuclear sclerotic cataract right eye.     PROCEDURE:  Phacoemusification with posterior chamber intraocular lens placement of the right eye   ULTRASOUND TIME: Procedure(s) with comments: CATARACT EXTRACTION PHACO AND INTRAOCULAR LENS PLACEMENT (IOC) RIGHT 12.41 01:15.0 (Right) - Diabetic  LENS:   Implant Name Type Inv. Item Serial No. Manufacturer Lot No. LRB No. Used Action  LENS IOL TECNIS EYHANCE 19.5 - [id number] Intraocular Lens LENS IOL TECNIS EYHANCE 19.5 [id number] [employer]  Right 1 Implanted         SURGEON:  Wyonia Hough, MD   ANESTHESIA:  Topical with tetracaine drops and 2% Xylocaine jelly, augmented with 1% preservative-free intracameral lidocaine.    COMPLICATIONS:  None.   DESCRIPTION OF PROCEDURE:  The patient was identified in the holding room and transported to the operating room and placed in the supine position under the operating microscope.  The right eye was identified as the operative eye and it was prepped and draped in the usual sterile ophthalmic fashion.   A 1 millimeter clear-corneal paracentesis was made at the 12:00 position.  0.5 ml of preservative-free 1% lidocaine was injected into the anterior chamber. The anterior chamber was filled with Viscoat viscoelastic.  A 2.4 millimeter keratome was used to make a near-clear corneal incision at the 9:00 position.  A curvilinear capsulorrhexis was made with a cystotome and capsulorrhexis forceps.  Balanced salt solution was used to hydrodissect and hydrodelineate the nucleus.   Phacoemulsification was then used in stop and chop fashion to remove the lens nucleus and epinucleus.  The remaining cortex was then removed using the irrigation and aspiration handpiece. Provisc was then placed into the capsular bag to distend it for lens placement.  A lens was then injected  into the capsular bag.  The remaining viscoelastic was aspirated.   Wounds were hydrated with balanced salt solution.  The anterior chamber was inflated to a physiologic pressure with balanced salt solution.  No wound leaks were noted. Cefuroxime 0.1 ml of a '10mg'$ /ml solution was injected into the anterior chamber for a dose of 1 mg of intracameral antibiotic at the completion of the case.   Timolol and Brimonidine drops were applied to the eye.  The patient was taken to the recovery room in stable condition without complications of anesthesia or surgery.   Jeren Dufrane 09/21/2022, 11:59 AM

## 2022-09-21 NOTE — H&P (Signed)
[facility]   Primary Care Physician:  Tracie Harrier, MD Ophthalmologist: Dr. Leandrew Koyanagi  Pre-Procedure History & Physical: HPI:  Glen Martinez is a 88 y.o. male here for ophthalmic surgery.   Past Medical History:  Diagnosis Date   CVA (cerebral vascular accident) ([place]) 07/09/2021   No deficits   Diabetes mellitus without complication (HCC)    GERD (gastroesophageal reflux disease)    Gout    Hyperlipidemia    Hypertension    Hypothyroidism    PAF (paroxysmal atrial fibrillation) ([place])    Wears dentures    partial upper and lower    Past Surgical History:  Procedure Laterality Date   CATARACT EXTRACTION W/PHACO Left 05/02/2018   Procedure: CATARACT EXTRACTION PHACO AND INTRAOCULAR LENS PLACEMENT ([place]) DIABETIC;  Surgeon: Leandrew Koyanagi, MD;  Location: [facility];  Service: Ophthalmology;  Laterality: Left;  diabetic - insulin   COLONOSCOPY     HERNIA REPAIR     LOOP RECORDER INSERTION N/A 07/12/2021   Procedure: LOOP RECORDER INSERTION;  Surgeon: Evans Lance, MD;  Location: [facility];  Service: Cardiovascular;  Laterality: N/A;   TONSILLECTOMY      Prior to Admission medications   Medication Sig Start Date End Date Taking? Authorizing Provider  allopurinol (ZYLOPRIM) 100 MG tablet Take 100 mg by mouth daily.   Yes [provider]  amLODipine (NORVASC) 5 MG tablet Take 5 mg by mouth daily. 05/24/22  Yes [provider]  apixaban (ELIQUIS) 2.5 MG TABS tablet TAKE 1 TABLET BY MOUTH TWICE A DAY 04/11/22  Yes Evans Lance, MD  Cyanocobalamin (B-12) 1000 MCG TABS Take 1,000 mcg by mouth daily. 03/24/20  Yes [provider]  famotidine (PEPCID) 20 MG tablet Take 1 tablet (20 mg total) by mouth daily. 01/01/20 09/15/22 Yes Nance Pear, MD  finasteride (PROSCAR) 5 MG tablet Take 1 tablet (5 mg total) by mouth daily. 06/28/22  Yes McGowan, Larene Beach A, PA-C  gabapentin (NEURONTIN) 300 MG capsule Take 300 mg  by mouth at bedtime. 05/04/20  Yes [provider]  insulin NPH-regular Human (NOVOLIN 70/30) (70-30) 100 UNIT/ML injection Inject 8-28 Units into the skin See admin instructions. 28 units at breakfast. 8 units at bedtime.   Yes [provider]  levothyroxine (SYNTHROID, LEVOTHROID) 75 MCG tablet Take 88 mcg by mouth daily before breakfast.   Yes [provider]  lisinopril (PRINIVIL,ZESTRIL) 40 MG tablet Take 40 mg by mouth daily.   Yes [provider]  metoprolol succinate (TOPROL-XL) 100 MG 24 hr tablet Take 100 mg by mouth daily. 07/18/18 09/21/22 Yes [provider]  nitroGLYCERIN (NITROSTAT) 0.4 MG SL tablet SMARTSIG:1 Tablet(s) Sublingual PRN 01/01/20  Yes [provider]  omeprazole (PRILOSEC) 20 MG capsule Take 20 mg by mouth daily.   Yes [provider]  simvastatin (ZOCOR) 40 MG tablet Take 40 mg by mouth daily.   Yes [provider]  warfarin (COUMADIN) 6 MG tablet Take 5 mg by mouth daily. 06/14/22  Yes [provider]    Allergies as of 08/19/2022   (No Known Allergies)    History reviewed. No pertinent family history.  Social History   Socioeconomic History   Marital status: Widowed    Spouse name: Not on file   Number of children: Not on file   Years of education: Not on file   Highest education level: Not on file  Occupational History   Not on file  Tobacco Use  Smoking status: Never   Smokeless tobacco: Never  Vaping Use   Vaping Use: Never used  Substance and Sexual Activity   Alcohol use: Not Currently    Comment: Holidays   Drug use: Never   Sexual activity: Not on file  Other Topics Concern   Not on file  Social History Narrative   Not on file   Social Determinants of Health   Financial Resource Strain: Not on file  Food Insecurity: Not on file  Transportation Needs: Not on file  Physical Activity: Not on file  Stress: Not on file  Social Connections: Not on file   Intimate Partner Violence: Not on file    Review of Systems: See HPI, otherwise negative ROS  Physical Exam: BP (!) 187/89   Pulse 82   Temp 98.2 F (36.8 C) (Temporal)   Resp 18   Ht '5\' 6"'$  (1.676 m)   Wt 79.8 kg   SpO2 98%   BMI 28.41 kg/m  General:   Alert,  pleasant and cooperative in NAD Head:  Normocephalic and atraumatic. Lungs:  Clear to auscultation.    Heart:  Regular rate and rhythm.   Impression/Plan: Glen Martinez is here for ophthalmic surgery.  Risks, benefits, limitations, and alternatives regarding ophthalmic surgery have been reviewed with the patient.  Questions have been answered.  All parties agreeable.   Leandrew Koyanagi, MD  09/21/2022, 11:12 AM

## 2022-09-22 ENCOUNTER — Encounter: Payer: Self-pay | Admitting: Ophthalmology

## 2022-10-04 ENCOUNTER — Ambulatory Visit (INDEPENDENT_AMBULATORY_CARE_PROVIDER_SITE_OTHER): Payer: Medicare HMO

## 2022-10-04 DIAGNOSIS — I619 Nontraumatic intracerebral hemorrhage, unspecified: Secondary | ICD-10-CM | POA: Diagnosis not present

## 2022-10-05 LAB — CUP PACEART REMOTE DEVICE CHECK
Date Time Interrogation Session: 20231227085159
Implantable Pulse Generator Implant Date: 20221003

## 2022-10-06 DIAGNOSIS — R791 Abnormal coagulation profile: Secondary | ICD-10-CM | POA: Diagnosis not present

## 2022-10-13 DIAGNOSIS — Z7901 Long term (current) use of anticoagulants: Secondary | ICD-10-CM | POA: Diagnosis not present

## 2022-10-13 NOTE — Progress Notes (Signed)
[facility] Summary Report / Loop Recorder 

## 2022-10-20 DIAGNOSIS — R791 Abnormal coagulation profile: Secondary | ICD-10-CM | POA: Diagnosis not present

## 2022-10-21 DIAGNOSIS — Z0100 Encounter for examination of eyes and vision without abnormal findings: Secondary | ICD-10-CM | POA: Diagnosis not present

## 2022-10-28 ENCOUNTER — Emergency Department (HOSPITAL_COMMUNITY): Payer: Medicare HMO

## 2022-10-28 ENCOUNTER — Inpatient Hospital Stay (HOSPITAL_COMMUNITY)
Admission: EM | Admit: 2022-10-28 | Discharge: 2022-11-10 | DRG: 064 | Disposition: E | Payer: Medicare HMO | Attending: Critical Care Medicine | Admitting: Critical Care Medicine

## 2022-10-28 ENCOUNTER — Other Ambulatory Visit: Payer: Self-pay

## 2022-10-28 ENCOUNTER — Inpatient Hospital Stay (HOSPITAL_COMMUNITY): Payer: Medicare HMO

## 2022-10-28 DIAGNOSIS — M109 Gout, unspecified: Secondary | ICD-10-CM | POA: Diagnosis present

## 2022-10-28 DIAGNOSIS — Z9841 Cataract extraction status, right eye: Secondary | ICD-10-CM

## 2022-10-28 DIAGNOSIS — H53462 Homonymous bilateral field defects, left side: Secondary | ICD-10-CM | POA: Diagnosis present

## 2022-10-28 DIAGNOSIS — R29713 NIHSS score 13: Secondary | ICD-10-CM | POA: Diagnosis present

## 2022-10-28 DIAGNOSIS — R55 Syncope and collapse: Secondary | ICD-10-CM | POA: Diagnosis not present

## 2022-10-28 DIAGNOSIS — Z8673 Personal history of transient ischemic attack (TIA), and cerebral infarction without residual deficits: Secondary | ICD-10-CM | POA: Diagnosis not present

## 2022-10-28 DIAGNOSIS — I152 Hypertension secondary to endocrine disorders: Secondary | ICD-10-CM | POA: Diagnosis present

## 2022-10-28 DIAGNOSIS — G8194 Hemiplegia, unspecified affecting left nondominant side: Secondary | ICD-10-CM | POA: Diagnosis present

## 2022-10-28 DIAGNOSIS — I6523 Occlusion and stenosis of bilateral carotid arteries: Secondary | ICD-10-CM | POA: Diagnosis not present

## 2022-10-28 DIAGNOSIS — I63232 Cerebral infarction due to unspecified occlusion or stenosis of left carotid arteries: Secondary | ICD-10-CM | POA: Diagnosis not present

## 2022-10-28 DIAGNOSIS — Z794 Long term (current) use of insulin: Secondary | ICD-10-CM | POA: Diagnosis not present

## 2022-10-28 DIAGNOSIS — R270 Ataxia, unspecified: Secondary | ICD-10-CM | POA: Diagnosis present

## 2022-10-28 DIAGNOSIS — M6282 Rhabdomyolysis: Secondary | ICD-10-CM | POA: Diagnosis not present

## 2022-10-28 DIAGNOSIS — H518 Other specified disorders of binocular movement: Secondary | ICD-10-CM | POA: Diagnosis not present

## 2022-10-28 DIAGNOSIS — R001 Bradycardia, unspecified: Secondary | ICD-10-CM | POA: Diagnosis present

## 2022-10-28 DIAGNOSIS — E861 Hypovolemia: Secondary | ICD-10-CM | POA: Diagnosis present

## 2022-10-28 DIAGNOSIS — Z79899 Other long term (current) drug therapy: Secondary | ICD-10-CM

## 2022-10-28 DIAGNOSIS — N400 Enlarged prostate without lower urinary tract symptoms: Secondary | ICD-10-CM | POA: Diagnosis present

## 2022-10-28 DIAGNOSIS — R29818 Other symptoms and signs involving the nervous system: Secondary | ICD-10-CM | POA: Diagnosis not present

## 2022-10-28 DIAGNOSIS — E1159 Type 2 diabetes mellitus with other circulatory complications: Secondary | ICD-10-CM | POA: Diagnosis present

## 2022-10-28 DIAGNOSIS — Z9842 Cataract extraction status, left eye: Secondary | ICD-10-CM

## 2022-10-28 DIAGNOSIS — I63511 Cerebral infarction due to unspecified occlusion or stenosis of right middle cerebral artery: Principal | ICD-10-CM | POA: Diagnosis present

## 2022-10-28 DIAGNOSIS — J189 Pneumonia, unspecified organism: Secondary | ICD-10-CM | POA: Diagnosis not present

## 2022-10-28 DIAGNOSIS — D4989 Neoplasm of unspecified behavior of other specified sites: Secondary | ICD-10-CM | POA: Diagnosis present

## 2022-10-28 DIAGNOSIS — W19XXXA Unspecified fall, initial encounter: Secondary | ICD-10-CM | POA: Diagnosis present

## 2022-10-28 DIAGNOSIS — I6602 Occlusion and stenosis of left middle cerebral artery: Secondary | ICD-10-CM | POA: Diagnosis not present

## 2022-10-28 DIAGNOSIS — R414 Neurologic neglect syndrome: Secondary | ICD-10-CM | POA: Diagnosis present

## 2022-10-28 DIAGNOSIS — Z66 Do not resuscitate: Secondary | ICD-10-CM | POA: Diagnosis not present

## 2022-10-28 DIAGNOSIS — I618 Other nontraumatic intracerebral hemorrhage: Secondary | ICD-10-CM | POA: Diagnosis not present

## 2022-10-28 DIAGNOSIS — E1122 Type 2 diabetes mellitus with diabetic chronic kidney disease: Secondary | ICD-10-CM | POA: Diagnosis present

## 2022-10-28 DIAGNOSIS — Y92009 Unspecified place in unspecified non-institutional (private) residence as the place of occurrence of the external cause: Secondary | ICD-10-CM | POA: Diagnosis not present

## 2022-10-28 DIAGNOSIS — T796XXA Traumatic ischemia of muscle, initial encounter: Secondary | ICD-10-CM | POA: Diagnosis not present

## 2022-10-28 DIAGNOSIS — N1831 Chronic kidney disease, stage 3a: Secondary | ICD-10-CM | POA: Diagnosis present

## 2022-10-28 DIAGNOSIS — R7401 Elevation of levels of liver transaminase levels: Secondary | ICD-10-CM | POA: Diagnosis present

## 2022-10-28 DIAGNOSIS — J9601 Acute respiratory failure with hypoxia: Secondary | ICD-10-CM | POA: Diagnosis not present

## 2022-10-28 DIAGNOSIS — G9349 Other encephalopathy: Secondary | ICD-10-CM | POA: Diagnosis not present

## 2022-10-28 DIAGNOSIS — R000 Tachycardia, unspecified: Secondary | ICD-10-CM | POA: Diagnosis present

## 2022-10-28 DIAGNOSIS — Z23 Encounter for immunization: Secondary | ICD-10-CM

## 2022-10-28 DIAGNOSIS — R4182 Altered mental status, unspecified: Secondary | ICD-10-CM | POA: Diagnosis not present

## 2022-10-28 DIAGNOSIS — D696 Thrombocytopenia, unspecified: Secondary | ICD-10-CM | POA: Diagnosis not present

## 2022-10-28 DIAGNOSIS — Z961 Presence of intraocular lens: Secondary | ICD-10-CM | POA: Diagnosis present

## 2022-10-28 DIAGNOSIS — R7989 Other specified abnormal findings of blood chemistry: Secondary | ICD-10-CM | POA: Diagnosis present

## 2022-10-28 DIAGNOSIS — E1169 Type 2 diabetes mellitus with other specified complication: Secondary | ICD-10-CM | POA: Diagnosis present

## 2022-10-28 DIAGNOSIS — N183 Chronic kidney disease, stage 3 unspecified: Secondary | ICD-10-CM

## 2022-10-28 DIAGNOSIS — Z95818 Presence of other cardiac implants and grafts: Secondary | ICD-10-CM

## 2022-10-28 DIAGNOSIS — R791 Abnormal coagulation profile: Secondary | ICD-10-CM | POA: Diagnosis present

## 2022-10-28 DIAGNOSIS — M47812 Spondylosis without myelopathy or radiculopathy, cervical region: Secondary | ICD-10-CM | POA: Diagnosis not present

## 2022-10-28 DIAGNOSIS — Z7901 Long term (current) use of anticoagulants: Secondary | ICD-10-CM

## 2022-10-28 DIAGNOSIS — Z515 Encounter for palliative care: Secondary | ICD-10-CM | POA: Diagnosis not present

## 2022-10-28 DIAGNOSIS — S199XXA Unspecified injury of neck, initial encounter: Secondary | ICD-10-CM | POA: Diagnosis not present

## 2022-10-28 DIAGNOSIS — M2578 Osteophyte, vertebrae: Secondary | ICD-10-CM | POA: Diagnosis not present

## 2022-10-28 DIAGNOSIS — R29810 Facial weakness: Secondary | ICD-10-CM | POA: Diagnosis not present

## 2022-10-28 DIAGNOSIS — I6522 Occlusion and stenosis of left carotid artery: Secondary | ICD-10-CM | POA: Diagnosis not present

## 2022-10-28 DIAGNOSIS — Y9301 Activity, walking, marching and hiking: Secondary | ICD-10-CM | POA: Diagnosis present

## 2022-10-28 DIAGNOSIS — E039 Hypothyroidism, unspecified: Secondary | ICD-10-CM | POA: Diagnosis present

## 2022-10-28 DIAGNOSIS — I480 Paroxysmal atrial fibrillation: Secondary | ICD-10-CM | POA: Diagnosis present

## 2022-10-28 DIAGNOSIS — R4781 Slurred speech: Secondary | ICD-10-CM | POA: Diagnosis not present

## 2022-10-28 DIAGNOSIS — D72829 Elevated white blood cell count, unspecified: Secondary | ICD-10-CM | POA: Diagnosis present

## 2022-10-28 DIAGNOSIS — G4733 Obstructive sleep apnea (adult) (pediatric): Secondary | ICD-10-CM | POA: Diagnosis present

## 2022-10-28 DIAGNOSIS — E876 Hypokalemia: Secondary | ICD-10-CM | POA: Diagnosis present

## 2022-10-28 DIAGNOSIS — E785 Hyperlipidemia, unspecified: Secondary | ICD-10-CM | POA: Diagnosis present

## 2022-10-28 DIAGNOSIS — Z4682 Encounter for fitting and adjustment of non-vascular catheter: Secondary | ICD-10-CM | POA: Diagnosis not present

## 2022-10-28 DIAGNOSIS — J9600 Acute respiratory failure, unspecified whether with hypoxia or hypercapnia: Secondary | ICD-10-CM

## 2022-10-28 DIAGNOSIS — R451 Restlessness and agitation: Secondary | ICD-10-CM | POA: Diagnosis not present

## 2022-10-28 DIAGNOSIS — K219 Gastro-esophageal reflux disease without esophagitis: Secondary | ICD-10-CM | POA: Diagnosis present

## 2022-10-28 DIAGNOSIS — N179 Acute kidney failure, unspecified: Secondary | ICD-10-CM | POA: Diagnosis present

## 2022-10-28 DIAGNOSIS — R059 Cough, unspecified: Secondary | ICD-10-CM | POA: Diagnosis not present

## 2022-10-28 DIAGNOSIS — I739 Peripheral vascular disease, unspecified: Secondary | ICD-10-CM | POA: Diagnosis not present

## 2022-10-28 DIAGNOSIS — R233 Spontaneous ecchymoses: Secondary | ICD-10-CM | POA: Diagnosis present

## 2022-10-28 DIAGNOSIS — I639 Cerebral infarction, unspecified: Secondary | ICD-10-CM | POA: Diagnosis not present

## 2022-10-28 DIAGNOSIS — R0902 Hypoxemia: Secondary | ICD-10-CM | POA: Diagnosis not present

## 2022-10-28 DIAGNOSIS — I4891 Unspecified atrial fibrillation: Secondary | ICD-10-CM | POA: Diagnosis not present

## 2022-10-28 DIAGNOSIS — S80812A Abrasion, left lower leg, initial encounter: Secondary | ICD-10-CM | POA: Diagnosis present

## 2022-10-28 DIAGNOSIS — R471 Dysarthria and anarthria: Secondary | ICD-10-CM | POA: Diagnosis present

## 2022-10-28 DIAGNOSIS — S80212A Abrasion, left knee, initial encounter: Secondary | ICD-10-CM | POA: Diagnosis present

## 2022-10-28 LAB — I-STAT CHEM 8, ED
BUN: 43 mg/dL — ABNORMAL HIGH (ref 8–23)
Calcium, Ion: 0.99 mmol/L — ABNORMAL LOW (ref 1.15–1.40)
Chloride: 104 mmol/L (ref 98–111)
Creatinine, Ser: 2.4 mg/dL — ABNORMAL HIGH (ref 0.61–1.24)
Glucose, Bld: 199 mg/dL — ABNORMAL HIGH (ref 70–99)
HCT: 49 % (ref 39.0–52.0)
Hemoglobin: 16.7 g/dL (ref 13.0–17.0)
Potassium: 4.1 mmol/L (ref 3.5–5.1)
Sodium: 139 mmol/L (ref 135–145)
TCO2: 22 mmol/L (ref 22–32)

## 2022-10-28 LAB — RAPID URINE DRUG SCREEN, HOSP PERFORMED
Amphetamines: NOT DETECTED
Barbiturates: NOT DETECTED
Benzodiazepines: NOT DETECTED
Cocaine: NOT DETECTED
Opiates: NOT DETECTED
Tetrahydrocannabinol: NOT DETECTED

## 2022-10-28 LAB — COMPREHENSIVE METABOLIC PANEL
ALT: 59 U/L — ABNORMAL HIGH (ref 0–44)
AST: 223 U/L — ABNORMAL HIGH (ref 15–41)
Albumin: 4 g/dL (ref 3.5–5.0)
Alkaline Phosphatase: 92 U/L (ref 38–126)
Anion gap: 18 — ABNORMAL HIGH (ref 5–15)
BUN: 29 mg/dL — ABNORMAL HIGH (ref 8–23)
CO2: 21 mmol/L — ABNORMAL LOW (ref 22–32)
Calcium: 8.8 mg/dL — ABNORMAL LOW (ref 8.9–10.3)
Chloride: 101 mmol/L (ref 98–111)
Creatinine, Ser: 2.29 mg/dL — ABNORMAL HIGH (ref 0.61–1.24)
GFR, Estimated: 27 mL/min — ABNORMAL LOW (ref >60)
Glucose, Bld: 196 mg/dL — ABNORMAL HIGH (ref 70–99)
Potassium: 3.5 mmol/L (ref 3.5–5.1)
Sodium: 140 mmol/L (ref 135–145)
Total Bilirubin: 1.5 mg/dL — ABNORMAL HIGH (ref 0.3–1.2)
Total Protein: 7.4 g/dL (ref 6.5–8.1)

## 2022-10-28 LAB — URINALYSIS, ROUTINE W REFLEX MICROSCOPIC
Bilirubin Urine: NEGATIVE
Glucose, UA: NEGATIVE mg/dL
Ketones, ur: 5 mg/dL — AB
Leukocytes,Ua: NEGATIVE
Nitrite: NEGATIVE
Protein, ur: 100 mg/dL — AB
Specific Gravity, Urine: 1.017 (ref 1.005–1.030)
pH: 5 (ref 5.0–8.0)

## 2022-10-28 LAB — ETHANOL: Alcohol, Ethyl (B): <10 mg/dL (ref <10)

## 2022-10-28 LAB — CBC
HCT: 47.4 % (ref 39.0–52.0)
Hemoglobin: 15.6 g/dL (ref 13.0–17.0)
MCH: 30.9 pg (ref 26.0–34.0)
MCHC: 32.9 g/dL (ref 30.0–36.0)
MCV: 93.9 fL (ref 80.0–100.0)
Platelets: 199 10*3/uL (ref 150–400)
RBC: 5.05 MIL/uL (ref 4.22–5.81)
RDW: 13.7 % (ref 11.5–15.5)
WBC: 22.6 10*3/uL — ABNORMAL HIGH (ref 4.0–10.5)
nRBC: 0 % (ref 0.0–0.2)

## 2022-10-28 LAB — APTT: aPTT: 24 seconds (ref 24–36)

## 2022-10-28 LAB — PROTIME-INR
INR: 1.2 (ref 0.8–1.2)
Prothrombin Time: 14.9 seconds (ref 11.4–15.2)

## 2022-10-28 LAB — DIFFERENTIAL
Abs Immature Granulocytes: 0.19 10*3/uL — ABNORMAL HIGH (ref 0.00–0.07)
Basophils Absolute: 0 10*3/uL (ref 0.0–0.1)
Basophils Relative: 0 %
Eosinophils Absolute: 0 10*3/uL (ref 0.0–0.5)
Eosinophils Relative: 0 %
Immature Granulocytes: 1 %
Lymphocytes Relative: 5 %
Lymphs Abs: 1.1 10*3/uL (ref 0.7–4.0)
Monocytes Absolute: 1.7 10*3/uL — ABNORMAL HIGH (ref 0.1–1.0)
Monocytes Relative: 8 %
Neutro Abs: 19.6 10*3/uL — ABNORMAL HIGH (ref 1.7–7.7)
Neutrophils Relative %: 86 %

## 2022-10-28 LAB — CBG MONITORING, ED: Glucose-Capillary: 176 mg/dL — ABNORMAL HIGH (ref 70–99)

## 2022-10-28 MED ORDER — SENNOSIDES-DOCUSATE SODIUM 8.6-50 MG PO TABS: 1.0000 | ORAL_TABLET | Freq: Every evening | ORAL | Status: DC | PRN

## 2022-10-28 MED ORDER — METOPROLOL SUCCINATE ER 25 MG PO TB24
50.0000 mg | ORAL_TABLET | Freq: Every day | ORAL | Status: DC
  Administered 2022-10-29: 50 mg via ORAL
  Filled 2022-10-28: qty 2

## 2022-10-28 MED ORDER — ACETAMINOPHEN 325 MG PO TABS: 650.0000 mg | ORAL_TABLET | ORAL | Status: DC | PRN

## 2022-10-28 MED ORDER — STROKE: EARLY STAGES OF RECOVERY BOOK: Freq: Once | Status: AC

## 2022-10-28 MED ORDER — SODIUM CHLORIDE 0.9 % IV SOLN: INTRAVENOUS | Status: DC

## 2022-10-28 MED ORDER — LORAZEPAM 2 MG/ML IJ SOLN
1.0000 mg | Freq: Once | INTRAMUSCULAR | Status: AC | PRN
  Administered 2022-10-28: 1 mg via INTRAVENOUS
  Filled 2022-10-28: qty 1

## 2022-10-28 MED ORDER — AMLODIPINE BESYLATE 5 MG PO TABS: 5.0000 mg | ORAL_TABLET | Freq: Every day | ORAL | Status: DC

## 2022-10-28 MED ORDER — SODIUM CHLORIDE 0.9 % IV BOLUS
1000.0000 mL | Freq: Once | INTRAVENOUS | Status: AC
  Administered 2022-10-28: 1000 mL via INTRAVENOUS

## 2022-10-28 MED ORDER — LEVOTHYROXINE SODIUM 88 MCG PO TABS: 88.0000 ug | ORAL_TABLET | Freq: Every day | ORAL | Status: DC

## 2022-10-28 MED ORDER — SIMVASTATIN 20 MG PO TABS: 40.0000 mg | ORAL_TABLET | Freq: Every day | ORAL | Status: DC

## 2022-10-28 MED ORDER — FINASTERIDE 5 MG PO TABS: 5.0000 mg | ORAL_TABLET | Freq: Every day | ORAL | Status: DC

## 2022-10-28 MED ORDER — INSULIN ASPART 100 UNIT/ML IJ SOLN
0.0000 [IU] | INTRAMUSCULAR | Status: DC
  Administered 2022-10-29: 3 [IU] via SUBCUTANEOUS
  Administered 2022-10-29: 2 [IU] via SUBCUTANEOUS
  Administered 2022-10-29: 3 [IU] via SUBCUTANEOUS
  Administered 2022-10-30 (×5): 2 [IU] via SUBCUTANEOUS
  Administered 2022-10-30 – 2022-10-31 (×3): 3 [IU] via SUBCUTANEOUS
  Administered 2022-10-31: 5 [IU] via SUBCUTANEOUS
  Administered 2022-10-31: 2 [IU] via SUBCUTANEOUS

## 2022-10-28 MED ORDER — ACETAMINOPHEN 650 MG RE SUPP: 650.0000 mg | RECTAL | Status: DC | PRN

## 2022-10-28 MED ORDER — ACETAMINOPHEN 160 MG/5ML PO SOLN: 650.0000 mg | ORAL | Status: DC | PRN

## 2022-10-28 MED ORDER — ASPIRIN 81 MG PO TBEC
81.0000 mg | DELAYED_RELEASE_TABLET | Freq: Every day | ORAL | Status: DC
  Administered 2022-10-29: 81 mg via ORAL
  Filled 2022-10-28: qty 1

## 2022-10-28 NOTE — Assessment & Plan Note (Signed)
Continue Proscar   

## 2022-10-28 NOTE — Assessment & Plan Note (Signed)
Out of permissive hypertension window.  Resume Toprol-XL and amlodipine.  Holding lisinopril with AKI.

## 2022-10-28 NOTE — Assessment & Plan Note (Signed)
Placed on SSI q4h while NPO.

## 2022-10-28 NOTE — Assessment & Plan Note (Signed)
Presenting with left-sided weakness, left facial droop, left-sided hemineglect, dysarthria.  MRI shows large posterior right MCA stroke with petechial hemorrhage. -Neurology following -MRI head and carotid Dopplers ordered -Echocardiogram -Hold anticoagulation per neurology, started on aspirin 81 mg daily for now -Keep on telemetry, continue neurochecks -PT/OT/SLP eval -n.p.o. pending swallow screen

## 2022-10-28 NOTE — Assessment & Plan Note (Signed)
Continue Synthroid °

## 2022-10-28 NOTE — Assessment & Plan Note (Signed)
Mild AST and total bilirubin elevation in setting of hypovolemia.  Abdomen is nontender.  Repeat labs in AM.

## 2022-10-28 NOTE — Assessment & Plan Note (Addendum)
Remains in atrial fibrillation, rates somewhat high.  Was previously on Eliquis but is now on Coumadin.  INR subtherapeutic at 1.2. Change in anticoagulation appears to be cost related. -Continue Toprol-XL 50 mg daily -Hold anticoagulation for now per neurology

## 2022-10-28 NOTE — Assessment & Plan Note (Signed)
Continue CPAP nightly. °

## 2022-10-28 NOTE — Assessment & Plan Note (Signed)
Continue simvastatin. 

## 2022-10-28 NOTE — Assessment & Plan Note (Signed)
Creatinine 2.29 on admission compared to baseline 1.4-1.5.  Appears volume depleted. -Continue IV fluids overnight -Hold lisinopril -UA negative for UTI

## 2022-10-28 NOTE — ED Notes (Signed)
Pt returned from MRI °

## 2022-10-28 NOTE — ED Triage Notes (Signed)
Pt BIB EMS from home with facial droop, weakness, slurred speech. Last spoken normally on the phone by family on Wednesday. Patient has been having trouble walking since last night. Patient had a fall this afternoon with multiple abrasions to the left leg. Patient denies any pain or discomfort. Lack of sensation on the left face and left arm. A&O x4.  LVO 6 CBG 172 HR 210 A-fib initially, HR 110 upon arrival to hospital. 16# L AC '10mg'$  of Cardizem given 133/87

## 2022-10-28 NOTE — Hospital Course (Signed)
Glen Martinez is a 89 y.o. male with medical history significant for PAF on Coumadin, history of CVA, insulin-dependent T2DM, CKD stage IIIa, HTN, HLD, hypothyroidism, BPH, OSA on CPAP who is admitted with a large posterior right MCA stroke with petechial hemorrhage.

## 2022-10-28 NOTE — ED Provider Notes (Signed)
[facility] Provider Note   CSN: [id number] Arrival date & time: 11/09/2022  1825     History  Chief Complaint  Patient presents with   Cerebrovascular Accident    Glen Martinez is a 89 y.o. male with a past medical history of cerebellar stroke and 2022, GERD, gout, hyperlipidemia, hypertension, hypothyroidism BIB by EMS for evaluation of left-sided weakness, slurred speech and left-sided facial droop.  Patient last spoke to his family on Wednesday and appeared normal.  Patient has been having trouble walking since last night and he had a fall this afternoon with multiple abrasions to the left leg.  He is ANO x 4.  A-fib RVR heart rate 210 with EMS given 10 mg of Cardizem.  CBG 172.  HPI  Past Medical History:  Diagnosis Date   CVA (cerebral vascular accident) ([place]) 07/09/2021   No deficits   Diabetes mellitus without complication (HCC)    GERD (gastroesophageal reflux disease)    Gout    Hyperlipidemia    Hypertension    Hypothyroidism    PAF (paroxysmal atrial fibrillation) ([place])    Wears dentures    partial upper and lower   Past Surgical History:  Procedure Laterality Date   CATARACT EXTRACTION W/PHACO Left 05/02/2018   Procedure: CATARACT EXTRACTION PHACO AND INTRAOCULAR LENS PLACEMENT ([place]) DIABETIC;  Surgeon: Leandrew Koyanagi, MD;  Location: [facility];  Service: Ophthalmology;  Laterality: Left;  diabetic - insulin   CATARACT EXTRACTION W/PHACO Right 09/21/2022   Procedure: CATARACT EXTRACTION PHACO AND INTRAOCULAR LENS PLACEMENT ([facility]) RIGHT 12.41 01:15.0;  Surgeon: Leandrew Koyanagi, MD;  Location: [facility];  Service: Ophthalmology;  Laterality: Right;  Diabetic   COLONOSCOPY     HERNIA REPAIR     LOOP RECORDER INSERTION N/A 07/12/2021   Procedure: LOOP RECORDER INSERTION;  Surgeon: Evans Lance, MD;  Location: [facility];  Service: Cardiovascular;  Laterality: N/A;   TONSILLECTOMY        Home Medications Prior to Admission medications   Medication Sig Start Date End Date Taking? Authorizing Provider  amLODipine (NORVASC) 5 MG tablet Take 5 mg by mouth daily. 05/24/22  Yes [provider]  finasteride (PROSCAR) 5 MG tablet Take 1 tablet (5 mg total) by mouth daily. 06/28/22  Yes McGowan, Larene Beach A, PA-C  insulin NPH-regular Human (NOVOLIN 70/30) (70-30) 100 UNIT/ML injection Inject 8-28 Units into the skin See admin instructions. 28 units at breakfast. 8 units at bedtime.   Yes [provider]  levothyroxine (SYNTHROID) 88 MCG tablet Take 88 mcg by mouth daily before breakfast.   Yes [provider]  warfarin (COUMADIN) 6 MG tablet Take 5 mg by mouth every Monday, Wednesday, and Friday. 06/14/22  Yes [provider]  allopurinol (ZYLOPRIM) 100 MG tablet Take 100 mg by mouth daily. Patient not taking: Reported on 10/29/2022    [provider]  Cyanocobalamin (B-12) 1000 MCG TABS Take 1,000 mcg by mouth daily. 03/24/20   [provider]  famotidine (PEPCID) 20 MG tablet Take 1 tablet (20 mg total) by mouth daily. 01/01/20 09/15/22  Nance Pear, MD  gabapentin (NEURONTIN) 300 MG capsule Take 300 mg by mouth at bedtime. 05/04/20   [provider]  lisinopril (PRINIVIL,ZESTRIL) 40 MG tablet Take 40 mg by mouth daily.    [provider]  metoprolol succinate (TOPROL-XL) 100 MG 24 hr tablet Take 100 mg by mouth daily. 07/18/18 10/30/23  [provider]  nitroGLYCERIN (  NITROSTAT) 0.4 MG SL tablet SMARTSIG:1 Tablet(s) Sublingual PRN 01/01/20   [provider]  omeprazole (PRILOSEC) 20 MG capsule Take 20 mg by mouth daily.    [provider]  simvastatin (ZOCOR) 40 MG tablet Take 40 mg by mouth daily.    [provider]      Allergies    Patient has no known allergies.    Review of Systems   Review of Systems Negative except as per HPI.  Physical Exam Updated Vital Signs BP  (!) 134/95   Pulse (!) 112   Temp 98.5 F (36.9 C) (Oral)   Resp 15   Ht '5\' 6"'$  (1.676 m)   Wt 72.6 kg   SpO2 100%   BMI 25.82 kg/m  Physical Exam Vitals and nursing note reviewed.  Constitutional:      Appearance: Normal appearance.  HENT:     Head: Normocephalic and atraumatic.     Mouth/Throat:     Mouth: Mucous membranes are moist.  Eyes:     General: No scleral icterus. Cardiovascular:     Rate and Rhythm: Normal rate and regular rhythm.     Pulses: Normal pulses.     Heart sounds: Normal heart sounds.  Pulmonary:     Effort: Pulmonary effort is normal.     Breath sounds: Normal breath sounds.  Abdominal:     General: Abdomen is flat.     Palpations: Abdomen is soft.     Tenderness: There is no abdominal tenderness.  Musculoskeletal:        General: No deformity.  Skin:    General: Skin is warm.     Findings: No rash.  Neurological:     General: No focal deficit present.     Mental Status: He is alert.     Comments: L facial droop, slurred speech. 4/5 strength on the L arm, 0/5 strength of the L hand. L leg with normal strength.  Psychiatric:        Mood and Affect: Mood normal.     ED Results / Procedures / Treatments   Labs (all labs ordered are listed, but only abnormal results are displayed) Labs Reviewed  CBC - Abnormal; Notable for the following components:      Result Value   WBC 22.6 (*)    All other components within normal limits  DIFFERENTIAL - Abnormal; Notable for the following components:   Neutro Abs 19.6 (*)    Monocytes Absolute 1.7 (*)    Abs Immature Granulocytes 0.19 (*)    All other components within normal limits  URINALYSIS, ROUTINE W REFLEX MICROSCOPIC - Abnormal; Notable for the following components:   APPearance CLOUDY (*)    Hgb urine dipstick LARGE (*)    Ketones, ur 5 (*)    Protein, ur 100 (*)    Bacteria, UA RARE (*)    All other components within normal limits  COMPREHENSIVE METABOLIC PANEL - Abnormal; Notable for  the following components:   CO2 21 (*)    Glucose, Bld 196 (*)    BUN 29 (*)    Creatinine, Ser 2.29 (*)    Calcium 8.8 (*)    AST 223 (*)    ALT 59 (*)    Total Bilirubin 1.5 (*)    GFR, Estimated 27 (*)    Anion gap 18 (*)    All other components within normal limits  HEMOGLOBIN A1C - Abnormal; Notable for the following components:   Hgb A1c MFr Bld 6.5 (*)  All other components within normal limits  COMPREHENSIVE METABOLIC PANEL - Abnormal; Notable for the following components:   Potassium 3.4 (*)    Glucose, Bld 124 (*)    BUN 37 (*)    Creatinine, Ser 2.05 (*)    Calcium 8.3 (*)    AST 198 (*)    ALT 58 (*)    GFR, Estimated 30 (*)    All other components within normal limits  CBC - Abnormal; Notable for the following components:   WBC 18.1 (*)    All other components within normal limits  CK - Abnormal; Notable for the following components:   Total CK 11,581 (*)    All other components within normal limits  I-STAT CHEM 8, ED - Abnormal; Notable for the following components:   BUN 43 (*)    Creatinine, Ser 2.40 (*)    Glucose, Bld 199 (*)    Calcium, Ion 0.99 (*)    All other components within normal limits  CBG MONITORING, ED - Abnormal; Notable for the following components:   Glucose-Capillary 176 (*)    All other components within normal limits  CBG MONITORING, ED - Abnormal; Notable for the following components:   Glucose-Capillary 130 (*)    All other components within normal limits  CBG MONITORING, ED - Abnormal; Notable for the following components:   Glucose-Capillary 135 (*)    All other components within normal limits  CULTURE, BLOOD (ROUTINE X 2)  CULTURE, BLOOD (ROUTINE X 2)  URINE CULTURE  ETHANOL  PROTIME-INR  APTT  RAPID URINE DRUG SCREEN, HOSP PERFORMED  PROTIME-INR  LIPID PANEL  LACTIC ACID, PLASMA  LACTIC ACID, PLASMA    EKG EKG Interpretation  Date/Time:  Friday October 28 2022 18:36:17 EST Ventricular Rate:  121 PR Interval:     QRS Duration: 94 QT Interval:  284 QTC Calculation: 403 R Axis:   65 Text Interpretation: Atrial fibrillation Probable LVH with secondary repol abnrm Anterior Q waves, possibly due to LVH Artifact in lead(s) I II III aVR aVL aVF V1 V2 Confirmed by Davonna Belling [phone]) on 10/10/2022 6:42:17 PM  Radiology MR ANGIO HEAD WO CONTRAST  Result Date: 11/07/2022 CLINICAL DATA:  Acute neurologic deficit EXAM: MRA HEAD WITHOUT CONTRAST TECHNIQUE: Angiographic images of the Circle of Willis were acquired using MRA technique without intravenous contrast. COMPARISON:  None Available. FINDINGS: POSTERIOR CIRCULATION: --Vertebral arteries: Normal --Inferior cerebellar arteries: Normal. --Basilar artery: Normal. --Superior cerebellar arteries: Normal. --Posterior cerebral arteries: Normal. ANTERIOR CIRCULATION: --Intracranial internal carotid arteries: Normal. --Anterior cerebral arteries (ACA): Normal. --Middle cerebral arteries (MCA): Normal. IMPRESSION: Normal intracranial MRA. Electronically Signed   By: Ulyses Jarred M.D.   On: 10/18/2022 23:59   CT Head Wo Contrast  Result Date: 10/25/2022 CLINICAL DATA:  Neurologic deficit.  Stroke suspected. EXAM: CT HEAD WITHOUT CONTRAST TECHNIQUE: Contiguous axial images were obtained from the base of the skull through the vertex without intravenous contrast. RADIATION DOSE REDUCTION: This exam was performed according to the departmental dose-optimization program which includes automated exposure control, adjustment of the mA and/or kV according to patient size and/or use of iterative reconstruction technique. COMPARISON:  Head CT dated 07/10/2021. FINDINGS: Evaluation of this exam is limited due to motion artifact. Brain: An area of hypodensity in the right posterior temporal and parietal lobes consistent with edema and infarct. There is minimal associated mass effect on the adjacent brain parenchyma. No midline shift. Areas of slight higher attenuation within the  infarct suspicious for petechial hemorrhage. Evaluation is  limited due to motion. There is background of mild age-related atrophy and chronic microvascular ischemic changes. Vascular: Suboptimally evaluated due to motion. Skull: Normal. Negative for fracture or focal lesion. Sinuses/Orbits: Partial opacification of a left ethmoid air cells. The remainder of the visualized paranasal sinuses and mastoid air cells are clear. Other: None IMPRESSION: 1. Right MCA territory infarct with findings suspicious for small petechial hemorrhage. No midline shift. 2. Mild age-related atrophy and chronic microvascular ischemic changes. These results were called by telephone at the time of interpretation on 11/03/2022 at 9:32 pm to Dr Alvino Chapel, who verbally acknowledged these results. Electronically Signed   By: Anner Crete M.D.   On: 11/05/2022 21:34   MR BRAIN WO CONTRAST  Result Date: 10/18/2022 CLINICAL DATA:  Acute neurologic deficit EXAM: MRI HEAD WITHOUT CONTRAST TECHNIQUE: Multiplanar, multiecho pulse sequences of the brain and surrounding structures were obtained without intravenous contrast. COMPARISON:  07/09/2021 FINDINGS: Brain: There is a large area of acute ischemia within the posterior right MCA territory. There is petechial hemorrhage within the infarcted territory. 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 There is multifocal hyperintense T2-weighted signal within the periventricular and deep white matter. Old right PCA territory infarct. Normal midline structures. Vascular: Normal flow voids. Skull and upper cervical spine: Normal marrow signal. Sinuses/Orbits: Paranasal sinuses are clear. No mastoid effusion. Normal orbits. Bilateral ocular lens replacements. Other: Negative IMPRESSION: 1. Large area of acute ischemia within the posterior right MCA territory. Petechial hemorrhage within the infarcted territory. No mass effect or midline shift. Heidelberg classification 1a:  HI1, scattered small petechiae, no mass effect. 2. Old right PCA territory infarct. Electronically Signed   By: Ulyses Jarred M.D.   On: 10/19/2022 21:25   CT Cervical Spine Wo Contrast  Result Date: 10/21/2022 CLINICAL DATA:  Neck trauma EXAM: CT CERVICAL SPINE WITHOUT CONTRAST TECHNIQUE: Multidetector CT imaging of the cervical spine was performed without intravenous contrast. Multiplanar CT image reconstructions were also generated. RADIATION DOSE REDUCTION: This exam was performed according to the departmental dose-optimization program which includes automated exposure control, adjustment of the mA and/or kV according to patient size and/or use of iterative reconstruction technique. COMPARISON:  None Available. FINDINGS: Alignment: Normal. Skull base and vertebrae: No acute fracture identified. No focal osseous lesion. Soft tissues and spinal canal: No prevertebral fluid or swelling. No visible canal hematoma. Disc levels: Large anterior osteophytes are seen at C4, C5, C6 and C7. Disc spaces are preserved. No severe central canal or neural foraminal stenosis identified at any level. Upper chest: Negative. Other: None. IMPRESSION: 1. No acute fracture or subluxation of the cervical spine. 2. Degenerative changes of the cervical spine. Electronically Signed   By: Ronney Asters M.D.   On: 10/31/2022 20:51   DG Chest Port 1 View  Result Date: 10/16/2022 CLINICAL DATA:  Pneumonia and cough EXAM: PORTABLE CHEST 1 VIEW COMPARISON:  12/12/2019 FINDINGS: Loop recorder is present. Slightly shallow inspiration. Heart size and pulmonary vascularity are normal. Lungs are clear. No pleural effusions. No pneumothorax. Mediastinal contours appear intact. IMPRESSION: No active disease. Electronically Signed   By: Lucienne Capers M.D.   On: 11/04/2022 19:46   DG Knee 2 Views Left  Result Date: 10/25/2022 CLINICAL DATA:  Fall EXAM: LEFT KNEE - 1-2 VIEW COMPARISON:  None Available. FINDINGS: There is no acute fracture  or dislocation. Joint spaces are well maintained. There is no joint effusion. Peripheral vascular calcifications are present. There is bubbly cortical lucency in the proximal femoral diaphysis  measuring 2.4 x 0.5 cm. IMPRESSION: 1. No acute fracture or dislocation. 2. Bubbly cortical lucency in the proximal femoral diaphysis measuring 2.4 x 0.5 cm. This is nonspecific and may represent a nonossifying fibroma. Electronically Signed   By: Ronney Asters M.D.   On: 11/04/2022 19:10    Procedures .Critical Care  Performed by: Rex Kras, [place] Authorized by: Rex Kras, PA   Critical care provider statement:    Critical care time (minutes):  45   Critical care was necessary to treat or prevent imminent or life-threatening deterioration of the following conditions: cva.   Critical care was time spent personally by me on the following activities:  Development of treatment plan with patient or surrogate, discussions with consultants, evaluation of patient's response to treatment, examination of patient, ordering and review of laboratory studies, ordering and review of radiographic studies, ordering and performing treatments and interventions, pulse oximetry, re-evaluation of patient's condition and review of old charts   Care discussed with: admitting provider       Medications Ordered in ED Medications   stroke: early stages of recovery book (has no administration in time range)  acetaminophen (TYLENOL) suppository 650 mg (has no administration in time range)  senna-docusate (Senokot-S) tablet 1 tablet (has no administration in time range)  insulin aspart (novoLOG) injection 0-15 Units (2 Units Subcutaneous Given 10/29/22 0835)  potassium chloride 10 mEq in 100 mL IVPB (10 mEq Intravenous New Bag/Given 10/29/22 1112)  0.45 % NaCl with KCl 20 mEq / L infusion (has no administration in time range)  aspirin suppository 300 mg (has no administration in time range)  metoprolol tartrate (LOPRESSOR) injection 2.5 mg  (2.5 mg Intravenous Given 10/29/22 1106)  sodium chloride 0.9 % bolus 1,000 mL (0 mLs Intravenous Stopped 10/12/2022 2313)  LORazepam (ATIVAN) injection 1 mg (1 mg Intravenous Given 10/18/2022 2310)  Tdap (BOOSTRIX) injection 0.5 mL (0.5 mLs Intramuscular Given 10/29/22 1112)    ED Course/ Medical Decision Making/ A&P                             Medical Decision Making Amount and/or Complexity of Data Reviewed Labs: ordered. Radiology: ordered.  Risk Decision regarding hospitalization.   This patient presents to the ED for stroke symptoms, this involves an extensive number of treatment options, and is a complaint that carries with a high risk of complications and morbidity.  The differential diagnosis includes cva/ich, meningitis, encephalopathy.  This is not an exhaustive list.  Lab tests: I ordered and personally interpreted labs.  The pertinent results include: WBC 18.1. Hbg unremarkable. Platelets unremarkable. No electrolyte abnormalities noted. BUN 37, creatinine 2.05 . UA significant for no acute abnormality. Cbg 135.  Imaging studies: I ordered imaging studies. I personally reviewed, interpreted imaging and agree with the radiologist's interpretations. The results include: acute infarction in the R posterior MCA territory.   Problem list/ ED course/ Critical interventions/ Medical management: HPI: See above Vital signs within normal range and stable throughout visit. Laboratory/imaging studies significant for: See above. On physical examination, patient is afebrile and appears in no acute distress. There was left facial droop with slurred speech and left arm weakness on neuroexam, 0/5 strength on the L hand. Mri brain showed acute infarction in the R posterior MCA territory. Pt was found to have afib with tachycarida and was given cardizem by ems. HR has decreased to 110s with afib. He also has skin abrasion on the L knee due  to a fall. XR of the L knee was negative, no evidence of  fracture or dislocation.  I have reviewed the patient home medicines and have made adjustments as needed.  Cardiac monitoring/EKG: The patient was maintained on a cardiac monitor.  I personally reviewed and interpreted the cardiac monitor which showed an underlying rhythm of: afib and tachycardia.  Additional history obtained: External records from outside source obtained and reviewed including: Chart review including previous notes, labs, imaging.  Consultations obtained: I requested consultation with Dr. Kathyrn Sheriff, and discussed lab and imaging findings as well as pertinent plan.  He recommended admission to medicine for observation, further evaluation and management.  I requested consultation with Dr. Posey Pronto, and discussed lab and imaging findings as well as pertinent plan.  He agreed to admit the patient.   Disposition Patient is admitted to the medicine.  This chart was dictated using voice recognition software.  Despite best efforts to proofread,  errors can occur which can change the documentation meaning.          Final Clinical Impression(s) / ED Diagnoses Final diagnoses:  Acute CVA (cerebrovascular accident) [facility])    Rx / [place] Orders ED Discharge Orders     None         Rex Kras, [place] 10/29/22 1133    Davonna Belling, MD 10/29/22 [phone]

## 2022-10-28 NOTE — Consult Note (Signed)
NEUROLOGY CONSULTATION NOTE   Date of service: October 28, 2022 Patient Name: Glen Martinez MRN:  [id number] DOB:  [birth year] Reason for consult: "Large R MCA stroke" Requesting Provider: Davonna Belling, MD _ _ _   _ __   _ __ _ _  __ __   _ __   __ _  History of Present Illness  Glen Martinez is a 89 y.o. male with PMH significant for DM2, HTN, GERD, HLD, pAfibb on warfarin with subtherapeutic INR today, prior R PCA stroke with HI2 petechial hemorrhage who is brought in by EMS for left sided weakness, slurred speech and left facial droop.  Family last spoke with patient on Wednesday when he seemed fine.  Granddaughter went over and found him on the floor right next to the bed and on his knees.  He was having significant difficulty getting up.  He had scrapes on his left knee and there was dried blood on the ground.  EMS was called and he was brought in for further evaluation and workup.  Patient lives by himself and his wife passed away a few months ago.  Family reports that he is compliant with his medications as best as they can tell.  LKW: 10/26/2022. mRS: 1 tNKASE: Not offered he is outside the window. Thrombectomy: Not offered as he is outside the window. NIHSS components Score: Comment  1a Level of Conscious 0'[x]'$  1'[]'$  2'[]'$  3'[]'$      1b LOC Questions 0'[x]'$  1'[]'$  2'[]'$       1c LOC Commands 0'[x]'$  1'[]'$  2'[]'$       2 Best Gaze 0'[]'$  1'[x]'$  2'[]'$       3 Visual 0'[]'$  1'[]'$  2'[x]'$  3'[]'$      4 Facial Palsy 0'[]'$  1'[]'$  2'[x]'$  3'[]'$      5a Motor Arm - left 0'[x]'$  1'[]'$  2'[]'$  3'[]'$  4'[]'$  UN'[]'$    5b Motor Arm - Right 0'[x]'$  1'[]'$  2'[]'$  3'[]'$  4'[]'$  UN'[]'$    6a Motor Leg - Left 0'[x]'$  1'[]'$  2'[]'$  3'[]'$  4'[]'$  UN'[]'$    6b Motor Leg - Right 0'[x]'$  1'[]'$  2'[]'$  3'[]'$  4'[]'$  UN'[]'$    7 Limb Ataxia 0'[]'$  1'[]'$  2'[x]'$  3'[]'$  UN'[]'$     8 Sensory 0'[]'$  1'[]'$  2'[x]'$  UN'[]'$      9 Best Language 0'[x]'$  1'[]'$  2'[]'$  3'[]'$      10 Dysarthria 0'[]'$  1'[]'$  2'[x]'$  UN'[]'$      11 Extinct. and Inattention 0'[]'$  1'[]'$  2'[x]'$       TOTAL: 13      ROS   Constitutional Denies weight loss, fever and chills.   HEENT Denies  changes in vision and hearing.   Respiratory Denies SOB and cough.   CV Denies palpitations and CP   GI Denies abdominal pain, nausea, vomiting and diarrhea.   GU Denies dysuria and urinary frequency.   MSK Denies myalgia and joint pain.  Skin Denies rash and pruritus.   Neurological Denies headache and syncope.   Psychiatric Denies recent changes in mood. Denies anxiety and depression.    Past History   Past Medical History:  Diagnosis Date   CVA (cerebral vascular accident) ([place]) 07/09/2021   No deficits   Diabetes mellitus without complication (HCC)    GERD (gastroesophageal reflux disease)    Gout    Hyperlipidemia    Hypertension    Hypothyroidism    PAF (paroxysmal atrial fibrillation) ([place])    Wears dentures    partial upper and lower   Past Surgical History:  Procedure Laterality Date   CATARACT EXTRACTION W/PHACO Left 05/02/2018   Procedure: CATARACT EXTRACTION PHACO AND INTRAOCULAR  LENS PLACEMENT ([place]) DIABETIC;  Surgeon: Leandrew Koyanagi, MD;  Location: [facility];  Service: Ophthalmology;  Laterality: Left;  diabetic - insulin   CATARACT EXTRACTION W/PHACO Right 09/21/2022   Procedure: CATARACT EXTRACTION PHACO AND INTRAOCULAR LENS PLACEMENT ([facility]) RIGHT 12.41 01:15.0;  Surgeon: Leandrew Koyanagi, MD;  Location: [facility];  Service: Ophthalmology;  Laterality: Right;  Diabetic   COLONOSCOPY     HERNIA REPAIR     LOOP RECORDER INSERTION N/A 07/12/2021   Procedure: LOOP RECORDER INSERTION;  Surgeon: Evans Lance, MD;  Location: [facility];  Service: Cardiovascular;  Laterality: N/A;   TONSILLECTOMY     No family history on file. Social History   Socioeconomic History   Marital status: Widowed    Spouse name: Not on file   Number of children: Not on file   Years of education: Not on file   Highest education level: Not on file  Occupational History   Not on file  Tobacco Use   Smoking status: Never   Smokeless tobacco: Never   Vaping Use   Vaping Use: Never used  Substance and Sexual Activity   Alcohol use: Not Currently    Comment: Holidays   Drug use: Never   Sexual activity: Not on file  Other Topics Concern   Not on file  Social History Narrative   Not on file   Social Determinants of Health   Financial Resource Strain: Not on file  Food Insecurity: Not on file  Transportation Needs: Not on file  Physical Activity: Not on file  Stress: Not on file  Social Connections: Not on file   No Known Allergies  Medications  (Not in a hospital admission)    Vitals   Vitals:   10/21/2022 2000 10/19/2022 2015 10/31/2022 2016 11/07/2022 2130  BP: (!) 180/101 (!) 179/87    Pulse: (!) 116 73 (!) 118 (!) 124  Resp: '15 19 17 '$ (!) 23  Temp:      TempSrc:      SpO2: 96% 97% 96% 100%  Weight:      Height:         Body mass index is 25.82 kg/m.  Physical Exam   General: Laying comfortably in bed; in no acute distress.  HENT: Normal oropharynx and mucosa. Normal external appearance of ears and nose.  Neck: Supple, no pain or tenderness  CV: No JVD. No peripheral edema.  Pulmonary: Symmetric Chest rise. Normal respiratory effort.  Abdomen: Soft to touch, non-tender.  Ext: No cyanosis, edema, or deformity  Skin: No rash. Normal palpation of skin.   Musculoskeletal: Normal digits and nails by inspection. No clubbing.   Neurologic Examination  Mental status/Cognition: Alert, oriented to self, place, month and year, good attention.  Speech/language: Significantly dysarthric speech, non fluent, comprehension intact, object naming intact, repetition intact.  Cranial nerves:   CN II Pupils equal and reactive to light, left hemianopsia.   CN III,IV,VI Mild right gaze preference but does cross midline.  No nystagmus.   CN V Absent to touch in left face.   CN VII Left facial droop.   CN VIII Makes eye contact and turns head towards speech.   CN IX & X normal palatal elevation, no uvular deviation   CN XI 5/5  head turn and 5/5 shoulder shrug bilaterally   CN XII midline tongue protrusion   Motor:  Muscle bulk: Normal, tone normal. Mvmt Root Nerve  Muscle Right Left Comments  SA C5/6 Ax Deltoid 5  4+   EF C5/6 Mc Biceps 5 4+   EE C6/7/8 Rad Triceps     WF C6/7 Med FCR     WE C7/8 PIN ECU     F Ab C8/T1 U ADM/FDI 5 4   HF L1/2/3 Fem Illopsoas 4+ 4   KE L2/3/4 Fem Quad     DF L4/5 D Peron Tib Ant 5 4   PF S1/2 Tibial Grc/Sol 5 4    Sensation:  Light touch Absent to touch in left upper extremity and left lower extremity and left face.   Pin prick    Temperature    Vibration   Proprioception    Coordination/Complex Motor:  - Finger to Nose with ataxia noted in left upper extremity. - Heel to shin with ataxia noted in left lower extremity. - Rapid alternating movement unable to do with left upper extremity. - Gait: Deferred for patient's safety especially since he had a fall. Labs   CBC:  Recent Labs  Lab 10/11/2022 1840 11/08/2022 1919  WBC 22.6*  --   NEUTROABS 19.6*  --   HGB 15.6 16.7  HCT 47.4 49.0  MCV 93.9  --   PLT 199  --     Basic Metabolic Panel:  Lab Results  Component Value Date   NA 140 10/18/2022   K 3.5 10/12/2022   CO2 21 (L) 10/26/2022   GLUCOSE 196 (H) 10/23/2022   BUN 29 (H) 11/09/2022   CREATININE 2.29 (H) 11/02/2022   CALCIUM 8.8 (L) 10/16/2022   GFRNONAA 27 (L) 11/07/2022   GFRAA >60 01/01/2020   Lipid Panel:  Lab Results  Component Value Date   LDLCALC 42 07/10/2021   HgbA1c:  Lab Results  Component Value Date   HGBA1C 7.2 (H) 07/10/2021   Urine Drug Screen:     Component Value Date/Time   LABOPIA NONE DETECTED 10/27/2022 2014   COCAINSCRNUR NONE DETECTED 10/25/2022 2014   LABBENZ NONE DETECTED 11/03/2022 2014   AMPHETMU NONE DETECTED 10/21/2022 2014   THCU NONE DETECTED 10/26/2022 2014   LABBARB NONE DETECTED 11/05/2022 2014    Alcohol Level     Component Value Date/Time   ETH <10 11/07/2022 1840    CT Head without  contrast(Personally reviewed): Large right MCA stroke with some petechial hemorrhage.  No midline shift.  MR Angio head without contrast and Carotid Duplex BL(Personally reviewed): pending  MRI Brain(Personally reviewed): Large posterior right MCA stroke with petechial hemorrhage.  Impression   DEONDREA MARKOS is a 89 y.o. male with PMH significant for DM2, HTN, GERD, HLD, pAfibb on warfarin, prior R PCA stroke with HI2 petechial hemorrhage who is brought in by EMS for left sided weakness, slurred speech and left facial droop.  He was found to have a large right MCA stroke with some petechial hemorrhage.  His exam is notable for left-sided ataxia, left facial droop, left sensory deficit and left-sided hemineglect/extinction with left hemianopsia.  He is outside the window for [facility] and is not a candidate for thrombectomy as his stroke appears completed with clear hypodensity on the CT head and T2/flair correlate on MRI.  His INR is subtherapeutic here and is unclear if he is compliant with his warfarin.  Family believes patient is compliant with his medication.  Recommendations   - Frequent Neuro checks per stroke unit protocol - Recommend Vascular imaging with MRA Angio Head without contrast and US Carotid doppler - Recommend obtaining TTE  - Recommend obtaining Lipid panel with LDL - Please  start statin if LDL > 70 - Recommend HbA1c to evaluate for diabetes and how well it is controlled. - Antithrombotic -we will hold his warfarin and start him on aspirin 81 mg daily for now. - Recommend DVT ppx - SBP goal -aim for gradual normotension, he is outside the 24-hour window. - Recommend Telemetry monitoring for arrythmia - Recommend bedside swallow screen prior to PO intake. - Stroke education booklet - Recommend PT/OT/SLP consult  ______________________________________________________________________   Thank you for the opportunity to take part in the care of this patient. If you  have any further questions, please contact the neurology consultation attending.  Signed,  [place] Pager Number [phone] _ _ _   _ __   _ __ _ _  __ __   _ __   __ _

## 2022-10-28 NOTE — H&P (Addendum)
History and Physical    BELVIN GAUSS UVO:[id number] DOB: Feb 18, 1931 DOA: 10/14/2022  PCP: Tracie Harrier, MD  Patient coming from: Home  I have personally briefly reviewed patient's old medical records in [place]  Chief Complaint: Left-sided weakness, slurred speech  HPI: KYLON PHILBROOK is a 89 y.o. male with medical history significant for PAF on Coumadin, history of CVA, insulin-dependent T2DM, CKD stage IIIa, HTN, HLD, hypothyroidism, BPH, OSA on CPAP who presented to the ED for evaluation of left-sided weakness, slurred speech, and left facial droop.  Patient was last known well when he spoke to family on Wednesday (1/17).  Patient states sometime yesterday he began to have difficulty walking and fell.  His granddaughter went to his home and found him on the floor next to the bed and on his knees.  He was having difficulty standing up.  He had abrasions on his left leg.  He was noted to have slurred speech as well.  EMS were called and per ED documentation he was noted to have A-fib RVR heart rate 210.  He was given 10 mg Cardizem with heart rate of 110 on arrival to the hospital.  Of note, patient was previously on Eliquis but appears anticoagulation was changed to Coumadin due to cost.  INR was supratherapeutic at 4.0 on 12/28 and subtherapeutic on 1/4 and 1/11.  ED Course  Labs/Imaging on admission: I have personally reviewed following labs and imaging studies.  Initial vitals showed BP 168/89, pulse 126, RR 22, temp 98.3 F, SpO2 100% on room air.  Labs show WBC 22.6, hemoglobin 15.6, platelets 199,000, sodium 140, potassium 3.5, bicarb 21, BUN 29, creatinine 2.29 (baseline 1.4-1.5), serum glucose 196, AST 223, ALT 59, alk phos 92, total bilirubin 1.5.  Serum ethanol <10.  UDS negative.  UA negative for UTI.  Blood cultures in process.  MRI brain shows a large area of acute ischemia within the posterior right MCA territory.  Petechial hemorrhage within the  infarcted territory noted.  Old right PCA territory infarct also seen.  CT cervical spine negative for acute fracture or subluxation.  Follow-up chest x-ray negative for focal consolidation, edema, effusion.  Loop recorder seen in place.  Left knee x-ray negative for acute fracture or dislocation.  Patient was given 1 L normal saline.  Neurology consulted and recommended medical admission.  The hospitalist service was consulted to admit  Review of Systems: All systems reviewed and are negative except as documented in history of present illness above.   Past Medical History:  Diagnosis Date   CVA (cerebral vascular accident) ([place]) 07/09/2021   No deficits   Diabetes mellitus without complication (HCC)    GERD (gastroesophageal reflux disease)    Gout    Hyperlipidemia    Hypertension    Hypothyroidism    PAF (paroxysmal atrial fibrillation) ([place])    Wears dentures    partial upper and lower    Past Surgical History:  Procedure Laterality Date   CATARACT EXTRACTION W/PHACO Left 05/02/2018   Procedure: CATARACT EXTRACTION PHACO AND INTRAOCULAR LENS PLACEMENT ([place]) DIABETIC;  Surgeon: Leandrew Koyanagi, MD;  Location: [facility];  Service: Ophthalmology;  Laterality: Left;  diabetic - insulin   CATARACT EXTRACTION W/PHACO Right 09/21/2022   Procedure: CATARACT EXTRACTION PHACO AND INTRAOCULAR LENS PLACEMENT ([facility]) RIGHT 12.41 01:15.0;  Surgeon: Leandrew Koyanagi, MD;  Location: [facility];  Service: Ophthalmology;  Laterality: Right;  Diabetic   COLONOSCOPY     HERNIA REPAIR  LOOP RECORDER INSERTION N/A 07/12/2021   Procedure: LOOP RECORDER INSERTION;  Surgeon: Evans Lance, MD;  Location: [facility];  Service: Cardiovascular;  Laterality: N/A;   TONSILLECTOMY      Social History:  reports that he has never smoked. He has never used smokeless tobacco. He reports that he does not currently use alcohol. He reports that he does not use  drugs.  No Known Allergies  No family history on file.   Prior to Admission medications   Medication Sig Start Date End Date Taking? Authorizing Provider  allopurinol (ZYLOPRIM) 100 MG tablet Take 100 mg by mouth daily.    [provider]  amLODipine (NORVASC) 5 MG tablet Take 5 mg by mouth daily. 05/24/22   [provider]  apixaban (ELIQUIS) 2.5 MG TABS tablet TAKE 1 TABLET BY MOUTH TWICE A DAY 04/11/22   Evans Lance, MD  Cyanocobalamin (B-12) 1000 MCG TABS Take 1,000 mcg by mouth daily. 03/24/20   [provider]  famotidine (PEPCID) 20 MG tablet Take 1 tablet (20 mg total) by mouth daily. 01/01/20 09/15/22  Nance Pear, MD  finasteride (PROSCAR) 5 MG tablet Take 1 tablet (5 mg total) by mouth daily. 06/28/22   Zara Council A, PA-C  gabapentin (NEURONTIN) 300 MG capsule Take 300 mg by mouth at bedtime. 05/04/20   [provider]  insulin NPH-regular Human (NOVOLIN 70/30) (70-30) 100 UNIT/ML injection Inject 8-28 Units into the skin See admin instructions. 28 units at breakfast. 8 units at bedtime.    [provider]  levothyroxine (SYNTHROID, LEVOTHROID) 75 MCG tablet Take 88 mcg by mouth daily before breakfast.    [provider]  lisinopril (PRINIVIL,ZESTRIL) 40 MG tablet Take 40 mg by mouth daily.    [provider]  metoprolol succinate (TOPROL-XL) 100 MG 24 hr tablet Take 100 mg by mouth daily. 07/18/18 09/21/22  [provider]  nitroGLYCERIN (NITROSTAT) 0.4 MG SL tablet SMARTSIG:1 Tablet(s) Sublingual PRN 01/01/20   [provider]  omeprazole (PRILOSEC) 20 MG capsule Take 20 mg by mouth daily.    [provider]  simvastatin (ZOCOR) 40 MG tablet Take 40 mg by mouth daily.    [provider]  warfarin (COUMADIN) 6 MG tablet Take 5 mg by mouth daily. 06/14/22   [provider]    Physical Exam: Vitals:   10/16/2022 2145 10/13/2022 2230 10/14/2022 2234 10/29/22 0007  BP: (!)  155/94 139/86  (!) 138/100  Pulse: (!) 125 (!) 117  (!) 118  Resp: (!) '26 14  16  '$ Temp:   98.2 F (36.8 C) 98.3 F (36.8 C)  TempSrc:   Oral Oral  SpO2: 100% 100%  96%  Weight:      Height:       Constitutional: Resting supine in bed.  NAD. Eyes: EOMI, lids and conjunctivae normal ENMT: Mucous membranes are moist. Posterior pharynx clear of any exudate or lesions.Normal dentition.  Neck: C-collar in place.   Respiratory: clear to auscultation anteriorly. Normal respiratory effort. No accessory muscle use.  Cardiovascular: Irregularly irregular, no murmurs / rubs / gallops. No extremity edema. 2+ pedal pulses. Abdomen: no tenderness, no masses palpated.  Musculoskeletal: no clubbing / cyanosis. No joint deformity upper and lower extremities.  Normal muscle tone.  Skin: Superficial abrasions left leg. No induration Neurologic: Dysarthric speech with left facial droop.  Sensation diminished on the left. Strength 5/5 right upper and lower extremities, 5/5 LLE, largely 4/5 LUE except 0/5 strength of  the hand.  LUE ataxia noted. Psychiatric: Alert and oriented x 3. Normal mood.   EKG: Personally reviewed. Atrial fibrillation, rate 121, ST depression and T wave inversions inferior lateral leads.  Previous EKG showed sinus bradycardia, ST and T wave changes were present on previous EKG.  Assessment/Plan Principal Problem:   Acute ischemic right MCA stroke (HCC) Active Problems:   Acute renal failure superimposed on stage 3a chronic kidney disease (HCC)   Paroxysmal atrial fibrillation (HCC)   Leukocytosis   Hypertension associated with diabetes ([place])   Type 2 diabetes mellitus with stage 3 chronic kidney disease, with long-term current use of insulin (HCC)   Benign prostatic hyperplasia   Hyperlipidemia associated with type 2 diabetes mellitus (HCC)   Hypothyroidism   Elevated LFTs   OSA (obstructive sleep apnea)   MOHID FURUYA is a 89 y.o. male with medical history significant for  PAF on Coumadin, history of CVA, insulin-dependent T2DM, CKD stage IIIa, HTN, HLD, hypothyroidism, BPH, OSA on CPAP who is admitted with a large posterior right MCA stroke with petechial hemorrhage.  Assessment and Plan: * Acute ischemic right MCA stroke ([place]) Presenting with left-sided weakness, left facial droop, left-sided hemineglect, dysarthria.  MRI shows large posterior right MCA stroke with petechial hemorrhage. -Neurology following -MRI head and carotid Dopplers ordered -Echocardiogram -Hold anticoagulation per neurology, started on aspirin 81 mg daily for now -Keep on telemetry, continue neurochecks -PT/OT/SLP eval -n.p.o. pending swallow screen  Acute renal failure superimposed on stage 3a chronic kidney disease ([place]) Creatinine 2.29 on admission compared to baseline 1.4-1.5.  Appears volume depleted. -Continue IV fluids overnight -Hold lisinopril -UA negative for UTI  Paroxysmal atrial fibrillation (HCC) Remains in atrial fibrillation, rates somewhat high.  Was previously on Eliquis but is now on Coumadin.  INR subtherapeutic at 1.2. Change in anticoagulation appears to be cost related. -Continue Toprol-XL 50 mg daily -Hold anticoagulation for now per neurology  Leukocytosis WBC 22.6 on admission.  No obvious infectious process.  UA negative for UTI.  CXR negative for pneumonia.  Follow blood cultures and repeat CBC in AM.  Hypertension associated with diabetes ([place]) Out of permissive hypertension window.  Resume Toprol-XL and amlodipine.  Holding lisinopril with AKI.  Type 2 diabetes mellitus with stage 3 chronic kidney disease, with long-term current use of insulin (HCC) Placed on SSI q4h while NPO.  OSA (obstructive sleep apnea) Continue CPAP nightly.  Elevated LFTs Mild AST and total bilirubin elevation in setting of hypovolemia.  Abdomen is nontender.  Repeat labs in AM.  Hypothyroidism Continue Synthroid.  Hyperlipidemia associated with type 2 diabetes  mellitus (HCC) Continue simvastatin.  Benign prostatic hyperplasia Continue Proscar.  DVT prophylaxis: SCD's Start: 10/11/2022 2325 Code Status: Full code Family Communication: Discussed with patient  Disposition Plan: From home, dispo pending clinical progress Consults called: Neurology Severity of Illness: The appropriate patient status for this patient is INPATIENT. Inpatient status is judged to be reasonable and necessary in order to provide the required intensity of service to ensure the patient's safety. The patient's presenting symptoms, physical exam findings, and initial radiographic and laboratory data in the context of their chronic comorbidities is felt to place them at high risk for further clinical deterioration. Furthermore, it is not anticipated that the patient will be medically stable for discharge from the hospital within 2 midnights of admission.   * I certify that at the point of admission it is my clinical judgment that the patient will require inpatient hospital care spanning beyond 2  midnights from the point of admission due to high intensity of service, high risk for further deterioration and high frequency of surveillance required.Zada Finders MD [facility]  If 7PM-7AM, please contact night-coverage [email or web address]  10/29/2022, 12:45 AM

## 2022-10-28 NOTE — Assessment & Plan Note (Signed)
WBC 22.6 on admission.  No obvious infectious process.  UA negative for UTI.  CXR negative for pneumonia.  Follow blood cultures and repeat CBC in AM.

## 2022-10-29 ENCOUNTER — Inpatient Hospital Stay (HOSPITAL_COMMUNITY): Payer: Medicare HMO

## 2022-10-29 DIAGNOSIS — I6523 Occlusion and stenosis of bilateral carotid arteries: Secondary | ICD-10-CM | POA: Diagnosis not present

## 2022-10-29 DIAGNOSIS — I4891 Unspecified atrial fibrillation: Secondary | ICD-10-CM | POA: Diagnosis not present

## 2022-10-29 DIAGNOSIS — N179 Acute kidney failure, unspecified: Secondary | ICD-10-CM | POA: Diagnosis not present

## 2022-10-29 DIAGNOSIS — I63511 Cerebral infarction due to unspecified occlusion or stenosis of right middle cerebral artery: Secondary | ICD-10-CM

## 2022-10-29 DIAGNOSIS — Z8673 Personal history of transient ischemic attack (TIA), and cerebral infarction without residual deficits: Secondary | ICD-10-CM

## 2022-10-29 DIAGNOSIS — M6282 Rhabdomyolysis: Secondary | ICD-10-CM | POA: Diagnosis not present

## 2022-10-29 LAB — CBC
HCT: 42.3 % (ref 39.0–52.0)
Hemoglobin: 14.5 g/dL (ref 13.0–17.0)
MCH: 31.4 pg (ref 26.0–34.0)
MCHC: 34.3 g/dL (ref 30.0–36.0)
MCV: 91.6 fL (ref 80.0–100.0)
Platelets: 164 10*3/uL (ref 150–400)
RBC: 4.62 MIL/uL (ref 4.22–5.81)
RDW: 13.8 % (ref 11.5–15.5)
WBC: 18.1 10*3/uL — ABNORMAL HIGH (ref 4.0–10.5)
nRBC: 0 % (ref 0.0–0.2)

## 2022-10-29 LAB — COMPREHENSIVE METABOLIC PANEL
ALT: 58 U/L — ABNORMAL HIGH (ref 0–44)
AST: 198 U/L — ABNORMAL HIGH (ref 15–41)
Albumin: 3.6 g/dL (ref 3.5–5.0)
Alkaline Phosphatase: 83 U/L (ref 38–126)
Anion gap: 14 (ref 5–15)
BUN: 37 mg/dL — ABNORMAL HIGH (ref 8–23)
CO2: 22 mmol/L (ref 22–32)
Calcium: 8.3 mg/dL — ABNORMAL LOW (ref 8.9–10.3)
Chloride: 104 mmol/L (ref 98–111)
Creatinine, Ser: 2.05 mg/dL — ABNORMAL HIGH (ref 0.61–1.24)
GFR, Estimated: 30 mL/min — ABNORMAL LOW (ref >60)
Glucose, Bld: 124 mg/dL — ABNORMAL HIGH (ref 70–99)
Potassium: 3.4 mmol/L — ABNORMAL LOW (ref 3.5–5.1)
Sodium: 140 mmol/L (ref 135–145)
Total Bilirubin: 0.9 mg/dL (ref 0.3–1.2)
Total Protein: 6.5 g/dL (ref 6.5–8.1)

## 2022-10-29 LAB — ECHOCARDIOGRAM COMPLETE
AR max vel: 2.77 cm2
AV Area VTI: 2.83 cm2
AV Area mean vel: 2.74 cm2
AV Mean grad: 1.3 mmHg
AV Peak grad: 2.1 mmHg
Ao pk vel: 0.72 m/s
Area-P 1/2: 3.39 cm2
Calc EF: 52.2 %
Height: 66 in
MV M vel: 2.66 m/s
MV Peak grad: 28.3 mmHg
P 1/2 time: 579 msec
S' Lateral: 3.2 cm
Single Plane A2C EF: 52.7 %
Single Plane A4C EF: 50.3 %
Weight: 2560 oz

## 2022-10-29 LAB — LIPID PANEL
Cholesterol: 172 mg/dL (ref 0–200)
HDL: 56 mg/dL (ref >40)
Total CHOL/HDL Ratio: 3.1 RATIO
Triglycerides: 102 mg/dL (ref <150)
VLDL: 20 mg/dL (ref 0–40)

## 2022-10-29 LAB — HEMOGLOBIN A1C
Hgb A1c MFr Bld: 6.5 % — ABNORMAL HIGH (ref 4.8–5.6)
Mean Plasma Glucose: 139.85 mg/dL

## 2022-10-29 LAB — PROTIME-INR
INR: 1.2 (ref 0.8–1.2)
Prothrombin Time: 15 seconds (ref 11.4–15.2)

## 2022-10-29 LAB — CBG MONITORING, ED
Glucose-Capillary: 130 mg/dL — ABNORMAL HIGH (ref 70–99)
Glucose-Capillary: 135 mg/dL — ABNORMAL HIGH (ref 70–99)
Glucose-Capillary: 114 mg/dL — ABNORMAL HIGH (ref 70–99)

## 2022-10-29 LAB — GLUCOSE, CAPILLARY
Glucose-Capillary: 227 mg/dL — ABNORMAL HIGH (ref 70–99)
Glucose-Capillary: 200 mg/dL — ABNORMAL HIGH (ref 70–99)

## 2022-10-29 LAB — LACTIC ACID, PLASMA
Lactic Acid, Venous: 1.7 mmol/L (ref 0.5–1.9)
Lactic Acid, Venous: 1.9 mmol/L (ref 0.5–1.9)

## 2022-10-29 LAB — CK: Total CK: 11581 U/L — ABNORMAL HIGH (ref 49–397)

## 2022-10-29 MED ORDER — ROSUVASTATIN CALCIUM 20 MG PO TABS
20.0000 mg | ORAL_TABLET | Freq: Every day | ORAL | Status: DC
  Administered 2022-10-29 – 2022-10-31 (×3): 20 mg via ORAL
  Filled 2022-10-29 (×3): qty 1

## 2022-10-29 MED ORDER — HEPARIN SODIUM (PORCINE) 5000 UNIT/ML IJ SOLN
5000.0000 [IU] | Freq: Three times a day (TID) | INTRAMUSCULAR | Status: DC
  Administered 2022-10-29 – 2022-11-01 (×9): 5000 [IU] via SUBCUTANEOUS
  Filled 2022-10-29 (×9): qty 1

## 2022-10-29 MED ORDER — POTASSIUM CHLORIDE IN NACL 20-0.45 MEQ/L-% IV SOLN
INTRAVENOUS | Status: DC
  Filled 2022-10-29: qty 1000

## 2022-10-29 MED ORDER — ASPIRIN 300 MG RE SUPP: 300.0000 mg | Freq: Every day | RECTAL | Status: DC

## 2022-10-29 MED ORDER — METOPROLOL TARTRATE 5 MG/5ML IV SOLN
2.5000 mg | Freq: Four times a day (QID) | INTRAVENOUS | Status: DC
  Administered 2022-10-29: 2.5 mg via INTRAVENOUS
  Filled 2022-10-29: qty 5

## 2022-10-29 MED ORDER — POTASSIUM CHLORIDE 10 MEQ/100ML IV SOLN
10.0000 meq | INTRAVENOUS | Status: AC
  Administered 2022-10-29 (×4): 10 meq via INTRAVENOUS
  Filled 2022-10-29 (×4): qty 100

## 2022-10-29 MED ORDER — METOPROLOL TARTRATE 25 MG PO TABS
25.0000 mg | ORAL_TABLET | Freq: Two times a day (BID) | ORAL | Status: DC
  Administered 2022-10-29 – 2022-10-30 (×4): 25 mg via ORAL
  Filled 2022-10-29 (×4): qty 1

## 2022-10-29 MED ORDER — POTASSIUM CHLORIDE CRYS ER 20 MEQ PO TBCR: 40.0000 meq | EXTENDED_RELEASE_TABLET | Freq: Once | ORAL | Status: DC

## 2022-10-29 MED ORDER — ASPIRIN 325 MG PO TABS
325.0000 mg | ORAL_TABLET | Freq: Every day | ORAL | Status: DC
  Administered 2022-10-29 – 2022-10-31 (×3): 325 mg via ORAL
  Filled 2022-10-29 (×3): qty 1

## 2022-10-29 MED ORDER — POTASSIUM CHLORIDE IN NACL 20-0.9 MEQ/L-% IV SOLN
INTRAVENOUS | Status: DC
  Filled 2022-10-29 (×3): qty 1000

## 2022-10-29 MED ORDER — METOPROLOL TARTRATE 5 MG/5ML IV SOLN
2.5000 mg | Freq: Four times a day (QID) | INTRAVENOUS | Status: DC | PRN
  Administered 2022-10-30 – 2022-10-31 (×5): 2.5 mg via INTRAVENOUS
  Filled 2022-10-29 (×5): qty 5

## 2022-10-29 MED ORDER — TETANUS-DIPHTH-ACELL PERTUSSIS 5-2.5-18.5 LF-MCG/0.5 IM SUSY
0.5000 mL | PREFILLED_SYRINGE | Freq: Once | INTRAMUSCULAR | Status: AC
  Administered 2022-10-29: 0.5 mL via INTRAMUSCULAR
  Filled 2022-10-29: qty 0.5

## 2022-10-29 NOTE — ED Notes (Signed)
ED TO INPATIENT HANDOFF REPORT  ED Nurse Name and Phone #: Luetta Nutting [phone]  S Name/Age/Gender Glen Martinez 89 y.o. male Room/Bed: 002C/002C  Code Status   Code Status: Full Code  Home/SNF/Other Home Patient oriented to: self, place, time, and situation Is this baseline? Yes   Triage Complete: Triage complete  Chief Complaint Acute ischemic right MCA stroke ([place]) [I63.511]  Triage Note Pt BIB EMS from home with facial droop, weakness, slurred speech. Last spoken normally on the phone by family on Wednesday. Patient has been having trouble walking since last night. Patient had a fall this afternoon with multiple abrasions to the left leg. Patient denies any pain or discomfort. Lack of sensation on the left face and left arm. A&O x4.  LVO 6 CBG 172 HR 210 A-fib initially, HR 110 upon arrival to hospital. 16# L AC '10mg'$  of Cardizem given 133/87    Allergies No Known Allergies  Level of Care/Admitting Diagnosis ED Disposition     ED Disposition  Admit   Condition  --   Comment  Hospital Area: [place] [[id number]]  Level of Care: Telemetry Medical [104]  May admit patient to [facility] or [facility] if equivalent level of care is available:: No  Covid Evaluation: Asymptomatic - no recent exposure (last 10 days) testing not required  Diagnosis: Acute ischemic right MCA stroke [facility]) [[id number]]  Admitting Physician: Lenore Cordia [[id number]]  Attending Physician: Lenore Cordia [[id number]]  Certification:: I certify this patient will need inpatient services for at least 2 midnights  Estimated Length of Stay: 2          B Medical/Surgery History Past Medical History:  Diagnosis Date   CVA (cerebral vascular accident) ([place]) 07/09/2021   No deficits   Diabetes mellitus without complication ([place])    GERD (gastroesophageal reflux disease)    Gout    Hyperlipidemia    Hypertension    Hypothyroidism    PAF (paroxysmal atrial fibrillation) ([place])     Wears dentures    partial upper and lower   Past Surgical History:  Procedure Laterality Date   CATARACT EXTRACTION W/PHACO Left 05/02/2018   Procedure: CATARACT EXTRACTION PHACO AND INTRAOCULAR LENS PLACEMENT ([place]) DIABETIC;  Surgeon: Leandrew Koyanagi, MD;  Location: [place];  Service: Ophthalmology;  Laterality: Left;  diabetic - insulin   CATARACT EXTRACTION W/PHACO Right 09/21/2022   Procedure: CATARACT EXTRACTION PHACO AND INTRAOCULAR LENS PLACEMENT ([facility]) RIGHT 12.41 01:15.0;  Surgeon: Leandrew Koyanagi, MD;  Location: [place];  Service: Ophthalmology;  Laterality: Right;  Diabetic   COLONOSCOPY     HERNIA REPAIR     LOOP RECORDER INSERTION N/A 07/12/2021   Procedure: LOOP RECORDER INSERTION;  Surgeon: Evans Lance, MD;  Location: [place] [facility];  Service: Cardiovascular;  Laterality: N/A;   TONSILLECTOMY       A IV Location/Drains/Wounds Patient Lines/Drains/Airways Status     Active Line/Drains/Airways     Name Placement date Placement time Site Days   Peripheral IV 10/31/2022 16 G Left Antecubital 10/18/2022  1836  Antecubital  1   Peripheral IV 10/29/22 22 G 1" Left Hand 10/29/22  0120  Hand  less than 1   Incision (Closed) 09/21/22 Eye Right 09/21/22  1150  -- 38            Intake/Output Last 24 hours  Intake/Output Summary (Last 24 hours) at 10/29/2022 1413 Last data filed at 11/08/2022 2313 Gross per 24 hour  Intake 700  ml  Output --  Net 700 ml    Labs/Imaging Results for orders placed or performed during the hospital encounter of 10/20/2022 (from the past 48 hour(s))  Ethanol     Status: None   Collection Time: 10/24/2022  6:40 PM  Result Value Ref Range   Alcohol, Ethyl (B) <10 <10 mg/dL    Comment: (NOTE) Lowest detectable limit for serum alcohol is 10 mg/dL.  For medical purposes only. Performed at [facility], [place] [address], [place], [place] [address]   Protime-INR     Status: None   Collection Time:  10/18/2022  6:40 PM  Result Value Ref Range   Prothrombin Time 14.9 11.4 - 15.2 seconds   INR 1.2 0.8 - 1.2    Comment: (NOTE) INR goal varies based on device and disease states. Performed at [facility], [place] [address] [place], [place] [address]   APTT     Status: None   Collection Time: 10/12/2022  6:40 PM  Result Value Ref Range   aPTT 24 24 - 36 seconds    Comment: Performed at [place] [address] [place], [place] [address]  CBC     Status: Abnormal   Collection Time: 11/03/2022  6:40 PM  Result Value Ref Range   WBC 22.6 (H) 4.0 - 10.5 K/uL   RBC 5.05 4.22 - 5.81 MIL/uL   Hemoglobin 15.6 13.0 - 17.0 g/dL   HCT 47.4 39.0 - 52.0 %   MCV 93.9 80.0 - 100.0 fL   MCH 30.9 26.0 - 34.0 pg   MCHC 32.9 30.0 - 36.0 g/dL   RDW 13.7 11.5 - 15.5 %   Platelets 199 150 - 400 K/uL   nRBC 0.0 0.0 - 0.2 %    Comment: Performed at [facility], [place] [address] [place], [place] [address]  Differential     Status: Abnormal   Collection Time: 10/19/2022  6:40 PM  Result Value Ref Range   Neutrophils Relative % 86 %   Neutro Abs 19.6 (H) 1.7 - 7.7 K/uL   Lymphocytes Relative 5 %   Lymphs Abs 1.1 0.7 - 4.0 K/uL   Monocytes Relative 8 %   Monocytes Absolute 1.7 (H) 0.1 - 1.0 K/uL   Eosinophils Relative 0 %   Eosinophils Absolute 0.0 0.0 - 0.5 K/uL   Basophils Relative 0 %   Basophils Absolute 0.0 0.0 - 0.1 K/uL   Immature Granulocytes 1 %   Abs Immature Granulocytes 0.19 (H) 0.00 - 0.07 K/uL    Comment: Performed at [facility] [address] [place],  [address]  I-stat chem 8, ED     Status: Abnormal   Collection Time: 10/31/2022  7:19 PM  Result Value Ref Range   Sodium 139 135 - 145 mmol/L   Potassium 4.1 3.5 - 5.1 mmol/L   Chloride 104 98 - 111 mmol/L   BUN 43 (H) 8 - 23 mg/dL   Creatinine, Ser 2.40 (H) 0.61 - 1.24 mg/dL   Glucose, Bld 199 (H) 70 - 99 mg/dL    Comment: Glucose reference range applies only to samples taken after fasting for at  least 8 hours.   Calcium, Ion 0.99 (L) 1.15 - 1.40 mmol/L   TCO2 22 22 - 32 mmol/L   Hemoglobin 16.7 13.0 - 17.0 g/dL   HCT 49.0 39.0 - 52.0 %  Comprehensive metabolic panel     Status: Abnormal   Collection Time:  10/12/2022  8:00 PM  Result Value Ref Range   Sodium 140 135 - 145 mmol/L   Potassium 3.5 3.5 - 5.1 mmol/L   Chloride 101 98 - 111 mmol/L   CO2 21 (L) 22 - 32 mmol/L   Glucose, Bld 196 (H) 70 - 99 mg/dL    Comment: Glucose reference range applies only to samples taken after fasting for at least 8 hours.   BUN 29 (H) 8 - 23 mg/dL   Creatinine, Ser 2.29 (H) 0.61 - 1.24 mg/dL   Calcium 8.8 (L) 8.9 - 10.3 mg/dL   Total Protein 7.4 6.5 - 8.1 g/dL   Albumin 4.0 3.5 - 5.0 g/dL   AST 223 (H) 15 - 41 U/L   ALT 59 (H) 0 - 44 U/L   Alkaline Phosphatase 92 38 - 126 U/L   Total Bilirubin 1.5 (H) 0.3 - 1.2 mg/dL   GFR, Estimated 27 (L) >60 mL/min    Comment: (NOTE) Calculated using the CKD-EPI Creatinine Equation (2021)    Anion gap 18 (H) 5 - 15    Comment: Performed at [facility], [place] [address], [place], [place] [address]  CK     Status: Abnormal   Collection Time: 10/24/2022  8:00 PM  Result Value Ref Range   Total CK 11,581 (H) 49 - 397 U/L    Comment: RESULT CONFIRMED BY MANUAL DILUTION Performed at [facility], [place] [address], [place], [place] [address]   Urine rapid drug screen (hosp performed)     Status: None   Collection Time: 10/16/2022  8:14 PM  Result Value Ref Range   Opiates NONE DETECTED NONE DETECTED   Cocaine NONE DETECTED NONE DETECTED   Benzodiazepines NONE DETECTED NONE DETECTED   Amphetamines NONE DETECTED NONE DETECTED   Tetrahydrocannabinol NONE DETECTED NONE DETECTED   Barbiturates NONE DETECTED NONE DETECTED    Comment: (NOTE) DRUG SCREEN FOR MEDICAL PURPOSES ONLY.  IF CONFIRMATION IS NEEDED FOR ANY PURPOSE, NOTIFY LAB WITHIN 5 DAYS.  LOWEST DETECTABLE LIMITS FOR URINE DRUG SCREEN Drug Class                     Cutoff  (ng/mL) Amphetamine and metabolites    1000 Barbiturate and metabolites    200 Benzodiazepine                 200 Opiates and metabolites        300 Cocaine and metabolites        300 THC                            50 Performed at [facility], [place] [address] [place], [place] [address]   Urinalysis, Routine w reflex microscopic Urine, Clean Catch     Status: Abnormal   Collection Time: 11/07/2022  8:14 PM  Result Value Ref Range   Color, Urine YELLOW YELLOW   APPearance CLOUDY (A) CLEAR   Specific Gravity, Urine 1.017 1.005 - 1.030   pH 5.0 5.0 - 8.0   Glucose, UA NEGATIVE NEGATIVE mg/dL   Hgb urine dipstick LARGE (A) NEGATIVE   Bilirubin Urine NEGATIVE NEGATIVE   Ketones, ur 5 (A) NEGATIVE mg/dL   Protein, ur 100 (A) NEGATIVE mg/dL   Nitrite NEGATIVE NEGATIVE   Leukocytes,Ua NEGATIVE NEGATIVE   RBC / HPF 0-5 0 - 5 RBC/hpf   WBC, UA 6-10 0 - 5 WBC/hpf  Bacteria, UA RARE (A) NONE SEEN   Squamous Epithelial / HPF 0-5 0 - 5 /HPF   Mucus PRESENT    Hyaline Casts, UA PRESENT    Granular Casts, UA PRESENT     Comment: Performed at [facility], [place] [address] [place], [place] [address]  CBG monitoring, ED     Status: Abnormal   Collection Time: 11/06/2022 11:51 PM  Result Value Ref Range   Glucose-Capillary 176 (H) 70 - 99 mg/dL    Comment: Glucose reference range applies only to samples taken after fasting for at least 8 hours.  CBG monitoring, ED     Status: Abnormal   Collection Time: 10/29/22  3:25 AM  Result Value Ref Range   Glucose-Capillary 130 (H) 70 - 99 mg/dL    Comment: Glucose reference range applies only to samples taken after fasting for at least 8 hours.  Hemoglobin A1c     Status: Abnormal   Collection Time: 10/29/22  5:07 AM  Result Value Ref Range   Hgb A1c MFr Bld 6.5 (H) 4.8 - 5.6 %    Comment: (NOTE) Pre diabetes:          5.7%-6.4%  Diabetes:              >6.4%  Glycemic control for   <7.0% adults with diabetes    Mean Plasma Glucose  139.85 mg/dL    Comment: Performed at [place] [address] [place], [place] [address]  Comprehensive metabolic panel     Status: Abnormal   Collection Time: 10/29/22  5:07 AM  Result Value Ref Range   Sodium 140 135 - 145 mmol/L   Potassium 3.4 (L) 3.5 - 5.1 mmol/L   Chloride 104 98 - 111 mmol/L   CO2 22 22 - 32 mmol/L   Glucose, Bld 124 (H) 70 - 99 mg/dL    Comment: Glucose reference range applies only to samples taken after fasting for at least 8 hours.   BUN 37 (H) 8 - 23 mg/dL   Creatinine, Ser 2.05 (H) 0.61 - 1.24 mg/dL   Calcium 8.3 (L) 8.9 - 10.3 mg/dL   Total Protein 6.5 6.5 - 8.1 g/dL   Albumin 3.6 3.5 - 5.0 g/dL   AST 198 (H) 15 - 41 U/L   ALT 58 (H) 0 - 44 U/L   Alkaline Phosphatase 83 38 - 126 U/L   Total Bilirubin 0.9 0.3 - 1.2 mg/dL   GFR, Estimated 30 (L) >60 mL/min    Comment: (NOTE) Calculated using the CKD-EPI Creatinine Equation (2021)    Anion gap 14 5 - 15    Comment: Performed at [facility], [place] [address] [place], [place] [address]  CBC     Status: Abnormal   Collection Time: 10/29/22  5:07 AM  Result Value Ref Range   WBC 18.1 (H) 4.0 - 10.5 K/uL   RBC 4.62 4.22 - 5.81 MIL/uL   Hemoglobin 14.5 13.0 - 17.0 g/dL   HCT 42.3 39.0 - 52.0 %   MCV 91.6 80.0 - 100.0 fL   MCH 31.4 26.0 - 34.0 pg   MCHC 34.3 30.0 - 36.0 g/dL   RDW 13.8 11.5 - 15.5 %   Platelets 164 150 - 400 K/uL   nRBC 0.0 0.0 - 0.2 %    Comment: Performed at [facility], [place] [address] [place], [place] [address]  Protime-INR     Status: None   Collection Time:  10/29/22  5:07 AM  Result Value Ref Range   Prothrombin Time 15.0 11.4 - 15.2 seconds   INR 1.2 0.8 - 1.2    Comment: (NOTE) INR goal varies based on device and disease states. Performed at [facility], [place] [address], [place], [place] [address]   Lipid panel     Status: None   Collection Time: 10/29/22  5:07 AM  Result Value Ref Range   Cholesterol 172 0 - 200 mg/dL   Triglycerides 102  <150 mg/dL   HDL 56 >40 mg/dL   Total CHOL/HDL Ratio 3.1 RATIO   VLDL 20 0 - 40 mg/dL   LDL Cholesterol NOT CALCULATED 0 - 99 mg/dL    Comment: Performed at [facility] [address], [place], [place] [address]  CBG monitoring, ED     Status: Abnormal   Collection Time: 10/29/22  8:30 AM  Result Value Ref Range   Glucose-Capillary 135 (H) 70 - 99 mg/dL    Comment: Glucose reference range applies only to samples taken after fasting for at least 8 hours.  CBG monitoring, ED     Status: Abnormal   Collection Time: 10/29/22 12:30 PM  Result Value Ref Range   Glucose-Capillary 114 (H) 70 - 99 mg/dL    Comment: Glucose reference range applies only to samples taken after fasting for at least 8 hours.   VAS US CAROTID  Result Date: 10/29/2022 Carotid Arterial Duplex Study Patient Name:  Glen Martinez  Date of Exam:   10/29/2022 Medical Rec #: [id number]        Accession #:    [id number] Date of Birth: 04-27-1931       Patient Gender: M Patient Age:   38 years Exam Location:  [facility] Procedure:      VAS US CAROTID Referring Phys: [facility] --------------------------------------------------------------------------------  Indications:       CVA. Risk Factors:      Hypertension, hyperlipidemia, Diabetes. Comparison Study:  No prior studies. Performing Technologist: Oliver Hum RVT  Examination Guidelines: A complete evaluation includes B-mode imaging, spectral Doppler, color Doppler, and power Doppler as needed of all accessible portions of each vessel. Bilateral testing is considered an integral part of a complete examination. Limited examinations for reoccurring indications may be performed as noted.  Right Carotid Findings: +----------+--------+--------+--------+-----------------------+--------+           PSV cm/sEDV cm/sStenosisPlaque Description     Comments +----------+--------+--------+--------+-----------------------+--------+ CCA Prox  83      14               smooth and heterogenous         +----------+--------+--------+--------+-----------------------+--------+ CCA Distal65      17              smooth and heterogenous         +----------+--------+--------+--------+-----------------------+--------+ ICA Prox  25      7               heterogenous and smooth         +----------+--------+--------+--------+-----------------------+--------+ ICA Distal42      12                                     tortuous +----------+--------+--------+--------+-----------------------+--------+ ECA       92      1                                               +----------+--------+--------+--------+-----------------------+--------+ +----------+--------+-------+--------+-------------------+  PSV cm/sEDV cmsDescribeArm Pressure (mmHG) +----------+--------+-------+--------+-------------------+ Subclavian89                                         +----------+--------+-------+--------+-------------------+ +---------+--------+--+--------+-+---------+ VertebralPSV cm/s24EDV cm/s5Antegrade +---------+--------+--+--------+-+---------+  Left Carotid Findings: +----------+--------+--------+--------+-----------------------+--------+           PSV cm/sEDV cm/sStenosisPlaque Description     Comments +----------+--------+--------+--------+-----------------------+--------+ CCA Prox  94      14              smooth and heterogenous         +----------+--------+--------+--------+-----------------------+--------+ CCA Distal72      11              smooth and heterogenous         +----------+--------+--------+--------+-----------------------+--------+ ICA Prox  33      12              smooth and heterogenous         +----------+--------+--------+--------+-----------------------+--------+ ICA Distal37      18                                     tortuous  +----------+--------+--------+--------+-----------------------+--------+ ECA       61      7                                               +----------+--------+--------+--------+-----------------------+--------+ +----------+--------+--------+--------+-------------------+           PSV cm/sEDV cm/sDescribeArm Pressure (mmHG) +----------+--------+--------+--------+-------------------+ Subclavian110                                         +----------+--------+--------+--------+-------------------+ +---------+--------+--+--------+--+---------+ VertebralPSV cm/s25EDV cm/s10Antegrade +---------+--------+--+--------+--+---------+   Summary: Right Carotid: Velocities in the right ICA are consistent with a 1-39% stenosis. Left Carotid: Velocities in the left ICA are consistent with a 1-39% stenosis. Vertebrals: Bilateral vertebral arteries demonstrate antegrade flow. *See table(s) above for measurements and observations.     Preliminary    MR ANGIO HEAD WO CONTRAST  Result Date: 10/31/2022 CLINICAL DATA:  Acute neurologic deficit EXAM: MRA HEAD WITHOUT CONTRAST TECHNIQUE: Angiographic images of the Circle of Willis were acquired using MRA technique without intravenous contrast. COMPARISON:  None Available. FINDINGS: POSTERIOR CIRCULATION: --Vertebral arteries: Normal --Inferior cerebellar arteries: Normal. --Basilar artery: Normal. --Superior cerebellar arteries: Normal. --Posterior cerebral arteries: Normal. ANTERIOR CIRCULATION: --Intracranial internal carotid arteries: Normal. --Anterior cerebral arteries (ACA): Normal. --Middle cerebral arteries (MCA): Normal. IMPRESSION: Normal intracranial MRA. Electronically Signed   By: Ulyses Jarred M.D.   On: 10/16/2022 23:59   CT Head Wo Contrast  Result Date: 10/16/2022 CLINICAL DATA:  Neurologic deficit.  Stroke suspected. EXAM: CT HEAD WITHOUT CONTRAST TECHNIQUE: Contiguous axial images were obtained from the base of the skull through the  vertex without intravenous contrast. RADIATION DOSE REDUCTION: This exam was performed according to the departmental dose-optimization program which includes automated exposure control, adjustment of the mA and/or kV according to patient size and/or use of iterative reconstruction technique. COMPARISON:  Head CT dated 07/10/2021. FINDINGS: Evaluation of this exam is limited due to motion artifact. Brain: An area of hypodensity in the  right posterior temporal and parietal lobes consistent with edema and infarct. There is minimal associated mass effect on the adjacent brain parenchyma. No midline shift. Areas of slight higher attenuation within the infarct suspicious for petechial hemorrhage. Evaluation is limited due to motion. There is background of mild age-related atrophy and chronic microvascular ischemic changes. Vascular: Suboptimally evaluated due to motion. Skull: Normal. Negative for fracture or focal lesion. Sinuses/Orbits: Partial opacification of a left ethmoid air cells. The remainder of the visualized paranasal sinuses and mastoid air cells are clear. Other: None IMPRESSION: 1. Right MCA territory infarct with findings suspicious for small petechial hemorrhage. No midline shift. 2. Mild age-related atrophy and chronic microvascular ischemic changes. These results were called by telephone at the time of interpretation on 11/05/2022 at 9:32 pm to Dr Alvino Chapel, who verbally acknowledged these results. Electronically Signed   By: Anner [place] M.D.   On: 10/22/2022 21:34   MR BRAIN WO CONTRAST  Result Date: 10/23/2022 CLINICAL DATA:  Acute neurologic deficit EXAM: MRI HEAD WITHOUT CONTRAST TECHNIQUE: Multiplanar, multiecho pulse sequences of the brain and surrounding structures were obtained without intravenous contrast. COMPARISON:  07/09/2021 FINDINGS: Brain: There is a large area of acute ischemia within the posterior right MCA territory. There is petechial hemorrhage within the infarcted  territory. 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 There is multifocal hyperintense T2-weighted signal within the periventricular and deep white matter. Old right PCA territory infarct. Normal midline structures. Vascular: Normal flow voids. Skull and upper cervical spine: Normal marrow signal. Sinuses/Orbits: Paranasal sinuses are clear. No mastoid effusion. Normal orbits. Bilateral ocular lens replacements. Other: Negative IMPRESSION: 1. Large area of acute ischemia within the posterior right MCA territory. Petechial hemorrhage within the infarcted territory. No mass effect or midline shift. Heidelberg classification 1a: HI1, scattered small petechiae, no mass effect. 2. Old right PCA territory infarct. Electronically Signed   By: Ulyses Jarred M.D.   On: 11/06/2022 21:25   CT Cervical Spine Wo Contrast  Result Date: 11/08/2022 CLINICAL DATA:  Neck trauma EXAM: CT CERVICAL SPINE WITHOUT CONTRAST TECHNIQUE: Multidetector CT imaging of the cervical spine was performed without intravenous contrast. Multiplanar CT image reconstructions were also generated. RADIATION DOSE REDUCTION: This exam was performed according to the departmental dose-optimization program which includes automated exposure control, adjustment of the mA and/or kV according to patient size and/or use of iterative reconstruction technique. COMPARISON:  None Available. FINDINGS: Alignment: Normal. Skull base and vertebrae: No acute fracture identified. No focal osseous lesion. Soft tissues and spinal canal: No prevertebral fluid or swelling. No visible canal hematoma. Disc levels: Large anterior osteophytes are seen at C4, C5, C6 and C7. Disc spaces are preserved. No severe central canal or neural foraminal stenosis identified at any level. Upper chest: Negative. Other: None. IMPRESSION: 1. No acute fracture or subluxation of the cervical spine. 2. Degenerative changes of the cervical spine. Electronically Signed    By: Ronney Asters M.D.   On: 10/15/2022 20:51   DG Chest Port 1 View  Result Date: 10/25/2022 CLINICAL DATA:  Pneumonia and cough EXAM: PORTABLE CHEST 1 VIEW COMPARISON:  12/12/2019 FINDINGS: Loop recorder is present. Slightly shallow inspiration. Heart size and pulmonary vascularity are normal. Lungs are clear. No pleural effusions. No pneumothorax. Mediastinal contours appear intact. IMPRESSION: No active disease. Electronically Signed   By: Lucienne Capers M.D.   On: 10/29/2022 19:46   DG Knee 2 Views Left  Result Date: 10/16/2022 CLINICAL DATA:  Fall EXAM: LEFT KNEE -  1-2 VIEW COMPARISON:  None Available. FINDINGS: There is no acute fracture or dislocation. Joint spaces are well maintained. There is no joint effusion. Peripheral vascular calcifications are present. There is bubbly cortical lucency in the proximal femoral diaphysis measuring 2.4 x 0.5 cm. IMPRESSION: 1. No acute fracture or dislocation. 2. Bubbly cortical lucency in the proximal femoral diaphysis measuring 2.4 x 0.5 cm. This is nonspecific and may represent a nonossifying fibroma. Electronically Signed   By: Ronney Asters M.D.   On: 10/18/2022 19:10    Pending Labs Unresulted Labs (From admission, onward)     Start     Ordered   10/30/22 0500  CK  Tomorrow morning,   R        10/29/22 0919   10/30/22 0500  CBC  Tomorrow morning,   R        10/29/22 0920   10/30/22 0500  Comprehensive metabolic panel  Daily,   R      10/29/22 0920   10/30/22 0500  Hepatitis panel, acute  Tomorrow morning,   R        10/29/22 0920   10/15/2022 1918  Lactic acid, plasma  Now then every 2 hours,   R      10/31/2022 1917   10/10/2022 1918  Urine Culture  Once,   URGENT       Question:  Indication  Answer:  Sepsis   10/14/2022 1917   10/18/2022 1917  Blood culture (routine x 2)  BLOOD CULTURE X 2,   R      10/18/2022 1917            Vitals/Pain Today's Vitals   10/29/22 1100 10/29/22 1110 10/29/22 1200 10/29/22 1218  BP: (!) 134/95 (!)  138/93 (!) 149/116 (!) 149/116  Pulse:    (!) 105  Resp: '15 12 17 18  '$ Temp:    98.5 F (36.9 C)  TempSrc:    Oral  SpO2:    99%  Weight:      Height:      PainSc:        Isolation Precautions No active isolations  Medications Medications  acetaminophen (TYLENOL) suppository 650 mg (has no administration in time range)  senna-docusate (Senokot-S) tablet 1 tablet (has no administration in time range)  insulin aspart (novoLOG) injection 0-15 Units ( Subcutaneous Not Given 10/29/22 1236)  0.45 % NaCl with KCl 20 mEq / L infusion ( Intravenous New Bag/Given 10/29/22 1132)  aspirin suppository 300 mg (has no administration in time range)  metoprolol tartrate (LOPRESSOR) tablet 25 mg (has no administration in time range)  metoprolol tartrate (LOPRESSOR) injection 2.5 mg (has no administration in time range)  sodium chloride 0.9 % bolus 1,000 mL (0 mLs Intravenous Stopped 11/07/2022 2313)  LORazepam (ATIVAN) injection 1 mg (1 mg Intravenous Given 10/24/2022 2310)   stroke: early stages of recovery book ( Does not apply Given 10/29/22 1220)  potassium chloride 10 mEq in 100 mL IVPB (0 mEq Intravenous Stopped 10/29/22 1343)  Tdap (BOOSTRIX) injection 0.5 mL (0.5 mLs Intramuscular Given 10/29/22 1112)    Mobility      Focused Assessments Neuro Assessment Handoff:  Swallow screen pass? Yes    NIH Stroke Scale  Dizziness Present: No Headache Present: No Interval: Shift assessment Level of Consciousness (1a.)   : Alert, keenly responsive LOC Questions (1b. )   : Answers both questions correctly LOC Commands (1c. )   : Performs both tasks correctly Best Gaze (2. )  :  Normal Visual (3. )  : No visual loss Facial Palsy (4. )    : Partial paralysis  Motor Arm, Left (5a. )   : Drift Motor Arm, Right (5b. ) : No drift Motor Leg, Left (6a. )  : No drift Motor Leg, Right (6b. ) : No drift Limb Ataxia (7. ): Absent Sensory (8. )  : Mild-to-moderate sensory loss, patient feels pinprick is less  sharp or is dull on the affected side, or there is a loss of superficial pain with pinprick, but patient is aware of being touched Best Language (9. )  : No aphasia Dysarthria (10. ): Severe dysarthria, patient's speech is so slurred as to be unintelligible in the absence of or out of proportion to any dysphasia, or is mute/anarthric Extinction/Inattention (11.)   : No Abnormality Complete NIHSS TOTAL: 6     Neuro Assessment:   Neuro Checks:   Shift assessment (10/29/22 0002)  Has TPA been given? No If patient is a Neuro Trauma and patient is going to OR before floor call report to [place] nurse: [phone] or [phone]   R Recommendations: See Admitting Provider Note  Report given to:   Additional Notes:

## 2022-10-29 NOTE — Progress Notes (Addendum)
[facility] PROGRESS NOTE   Glen Martinez JXB:[id number] DOB: October 01, 1931 DOA: 10/14/2022  PCP: Tracie Harrier, MD  Brief History/Interval Summary:  89 y.o. male with medical history significant for PAF on Coumadin, history of CVA, insulin-dependent T2DM, CKD stage IIIa, HTN, HLD, hypothyroidism, BPH, OSA on CPAP who presented to the ED for evaluation of left-sided weakness, slurred speech, and left facial droop.  Apparently he was found on the floor next to his bed by his granddaughter.  Found to have acute stroke.  CK level was also elevated.  Patient was hospitalized for further management.  Consultants: Neurology  Procedures: Echocardiogram    Subjective/Interval History: Patient noted to be quite dysarthric.  Does not appear to be in any discomfort.  Denies any pain.    Assessment/Plan:  Acute stroke Patient appears to have sustained a right MCA stroke.  Patient with left-sided weakness and dysarthria. Neurology has been consulted. Echocardiogram is pending Lipid panel was done.  LDL could not be calculated.  HDL is 56.  Total cholesterol is 172. HbA1c 6.5 PT OT SLP evaluation.  N.p.o. for now.  Acute kidney injury on chronic kidney disease stage IIIa/rhabdomyolysis/hypokalemia Baseline creatinine around 1.4-1.5.  This is in the setting of ACE inhibitor use which is currently on hold. CK level was noted to be elevated suggesting rhabdomyolysis.  He was found lying on the floor next to his bed.  Unclear how long he was on the floor. Will monitor CK levels. Continue with IV hydration. Creatinine seems to be better this morning. Monitor urine output.  Paroxysmal atrial fibrillation with RVR Was on metoprolol prior to admission.  Will place him on IV metoprolol for now since he has to be NPO. Was anticoagulated with warfarin.  Apparently he was on Eliquis previously but it was switched over due to cost issues.  INR was subtherapeutic at  admission. Anticoagulation on hold for now.  Leukocytosis Presented with WBC of 22.6.  No clear infectious etiology identified.  WBC is noted to be 18.1 today.  He is afebrile.  UA was negative for UTI.  Chest x-ray negative for pneumonia.  Follow-up on blood cultures.  Continue to monitor off of antibiotics.  Essential hypertension Monitor blood pressures closely.  May need to use parenteral agents.  Diabetes mellitus type 2, with renal complications with CKD stage IIIa Monitor CBGs.  Place him on SSI  Abnormal LFTs Noted to have elevated AST and ALT.  Alkaline phosphatase and bilirubin normal this morning.  Could be due to rhabdomyolysis.  Abdomen is benign.  Continue to trend labs.  Obstructive sleep apnea CPAP  Hypothyroidism Continue levothyroxine when able to take orally.  Hyperlipidemia Was on simvastatin prior to admission.  BPH Was on Proscar prior to admission  Left Thigh Lesion Patient noted to have lump in the left thigh area.  Soft to touch.  Nontender.  No erythema noted.  Could be a lipoma.  Monitor for now.  DVT Prophylaxis: Anticoagulation currently on hold.  SCDs. Code Status: Full code as of now Family Communication: Discussed with his granddaughter. Disposition Plan: To be determined  Status is: Inpatient Remains inpatient appropriate because: Acute stroke      Medications: Scheduled:   stroke: early stages of recovery book   Does not apply Once   aspirin EC  81 mg Oral Daily   finasteride  5 mg Oral Daily   insulin aspart  0-15 Units Subcutaneous Q4H   levothyroxine  88 mcg Oral Q0600   metoprolol succinate  50 mg Oral Daily   simvastatin  40 mg Oral Daily   Continuous:  sodium chloride 125 mL/hr at 10/29/22 0842   potassium chloride 10 mEq (10/29/22 0816)   GYI:[id number] **OR** acetaminophen (TYLENOL) oral liquid 160 mg/5 mL **OR** acetaminophen, senna-docusate  Antibiotics: Anti-infectives (From admission, onward)    None        Objective:  Vital Signs  Vitals:   10/29/22 0400 10/29/22 0536 10/29/22 0800 10/29/22 0843  BP: (!) 140/106 (!) 170/98 (!) 147/86   Pulse: (!) 110 (!) 105 (!) 112   Resp: '18 19 11   '$ Temp:    98.5 F (36.9 C)  TempSrc:    Oral  SpO2: 98% 100% 100%   Weight:      Height:        Intake/Output Summary (Last 24 hours) at 10/29/2022 0900 Last data filed at 10/22/2022 2313 Gross per 24 hour  Intake 700 ml  Output --  Net 700 ml   Filed Weights   11/05/2022 1834  Weight: 72.6 kg    General appearance: Awake alert.  In no distress.  Dysarthric Resp: Clear to auscultation bilaterally.  Normal effort Cardio: S1-S2 is irregularly irregular GI: Abdomen is soft.  Nontender nondistended.  Bowel sounds are present normal.  No masses organomegaly Extremities: Lump noted in the left thigh area anteriorly. Excoriations noted on skin on the left lower extremity and right lower extremity Left hemiparesis noted   Lab Results:  Data Reviewed: I have personally reviewed following labs and reports of the imaging studies  CBC: Recent Labs  Lab 10/19/2022 1840 11/09/2022 1919 10/29/22 0507  WBC 22.6*  --  18.1*  NEUTROABS 19.6*  --   --   HGB 15.6 16.7 14.5  HCT 47.4 49.0 42.3  MCV 93.9  --  91.6  PLT 199  --  938    Basic Metabolic Panel: Recent Labs  Lab 10/23/2022 1919 10/31/2022 2000 10/29/22 0507  NA 139 140 140  K 4.1 3.5 3.4*  CL 104 101 104  CO2  --  21* 22  GLUCOSE 199* 196* 124*  BUN 43* 29* 37*  CREATININE 2.40* 2.29* 2.05*  CALCIUM  --  8.8* 8.3*    GFR: Estimated Creatinine Clearance: 22 mL/min (A) (by C-G formula based on SCr of 2.05 mg/dL (H)).  Liver Function Tests: Recent Labs  Lab 10/19/2022 2000 10/29/22 0507  AST 223* 198*  ALT 59* 58*  ALKPHOS 92 83  BILITOT 1.5* 0.9  PROT 7.4 6.5  ALBUMIN 4.0 3.6     Coagulation Profile: Recent Labs  Lab 11/05/2022 1840 10/29/22 0507  INR 1.2 1.2    Cardiac Enzymes: Recent Labs  Lab 11/08/2022 2000   CKTOTAL 11,581*     HbA1C: Recent Labs    10/29/22 0507  HGBA1C 6.5*    CBG: Recent Labs  Lab 10/30/2022 2351 10/29/22 0325 10/29/22 0830  GLUCAP 176* 130* 135*    Lipid Profile: Recent Labs    10/29/22 0507  CHOL 172  HDL 56  LDLCALC NOT CALCULATED  TRIG 102  CHOLHDL 3.1     Radiology Studies: MR ANGIO HEAD WO CONTRAST  Result Date: 11/04/2022 CLINICAL DATA:  Acute neurologic deficit EXAM: MRA HEAD WITHOUT CONTRAST TECHNIQUE: Angiographic images of the Circle of Willis were acquired using MRA technique without intravenous contrast. COMPARISON:  None Available. FINDINGS: POSTERIOR CIRCULATION: --Vertebral arteries: Normal --Inferior cerebellar arteries: Normal. --Basilar artery: Normal. --Superior cerebellar arteries: Normal. --Posterior cerebral arteries: Normal. ANTERIOR CIRCULATION: --Intracranial internal  carotid arteries: Normal. --Anterior cerebral arteries (ACA): Normal. --Middle cerebral arteries (MCA): Normal. IMPRESSION: Normal intracranial MRA. Electronically Signed   By: Ulyses Jarred M.D.   On: 11/06/2022 23:59   CT Head Wo Contrast  Result Date: 10/21/2022 CLINICAL DATA:  Neurologic deficit.  Stroke suspected. EXAM: CT HEAD WITHOUT CONTRAST TECHNIQUE: Contiguous axial images were obtained from the base of the skull through the vertex without intravenous contrast. RADIATION DOSE REDUCTION: This exam was performed according to the departmental dose-optimization program which includes automated exposure control, adjustment of the mA and/or kV according to patient size and/or use of iterative reconstruction technique. COMPARISON:  Head CT dated 07/10/2021. FINDINGS: Evaluation of this exam is limited due to motion artifact. Brain: An area of hypodensity in the right posterior temporal and parietal lobes consistent with edema and infarct. There is minimal associated mass effect on the adjacent brain parenchyma. No midline shift. Areas of slight higher attenuation  within the infarct suspicious for petechial hemorrhage. Evaluation is limited due to motion. There is background of mild age-related atrophy and chronic microvascular ischemic changes. Vascular: Suboptimally evaluated due to motion. Skull: Normal. Negative for fracture or focal lesion. Sinuses/Orbits: Partial opacification of a left ethmoid air cells. The remainder of the visualized paranasal sinuses and mastoid air cells are clear. Other: None IMPRESSION: 1. Right MCA territory infarct with findings suspicious for small petechial hemorrhage. No midline shift. 2. Mild age-related atrophy and chronic microvascular ischemic changes. These results were called by telephone at the time of interpretation on 10/27/2022 at 9:32 pm to Dr Alvino Chapel, who verbally acknowledged these results. Electronically Signed   By: Anner Crete M.D.   On: 11/03/2022 21:34   MR BRAIN WO CONTRAST  Result Date: 10/27/2022 CLINICAL DATA:  Acute neurologic deficit EXAM: MRI HEAD WITHOUT CONTRAST TECHNIQUE: Multiplanar, multiecho pulse sequences of the brain and surrounding structures were obtained without intravenous contrast. COMPARISON:  07/09/2021 FINDINGS: Brain: There is a large area of acute ischemia within the posterior right MCA territory. There is petechial hemorrhage within the infarcted territory. 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 There is multifocal hyperintense T2-weighted signal within the periventricular and deep white matter. Old right PCA territory infarct. Normal midline structures. Vascular: Normal flow voids. Skull and upper cervical spine: Normal marrow signal. Sinuses/Orbits: Paranasal sinuses are clear. No mastoid effusion. Normal orbits. Bilateral ocular lens replacements. Other: Negative IMPRESSION: 1. Large area of acute ischemia within the posterior right MCA territory. Petechial hemorrhage within the infarcted territory. No mass effect or midline shift. [place]  classification 1a: HI1, scattered small petechiae, no mass effect. 2. Old right PCA territory infarct. Electronically Signed   By: Ulyses Jarred M.D.   On: 10/26/2022 21:25   CT Cervical Spine Wo Contrast  Result Date: 11/07/2022 CLINICAL DATA:  Neck trauma EXAM: CT CERVICAL SPINE WITHOUT CONTRAST TECHNIQUE: Multidetector CT imaging of the cervical spine was performed without intravenous contrast. Multiplanar CT image reconstructions were also generated. RADIATION DOSE REDUCTION: This exam was performed according to the departmental dose-optimization program which includes automated exposure control, adjustment of the mA and/or kV according to patient size and/or use of iterative reconstruction technique. COMPARISON:  None Available. FINDINGS: Alignment: Normal. Skull base and vertebrae: No acute fracture identified. No focal osseous lesion. Soft tissues and spinal canal: No prevertebral fluid or swelling. No visible canal hematoma. Disc levels: Large anterior osteophytes are seen at C4, C5, C6 and C7. Disc spaces are preserved. No severe central canal or neural foraminal stenosis  identified at any level. Upper chest: Negative. Other: None. IMPRESSION: 1. No acute fracture or subluxation of the cervical spine. 2. Degenerative changes of the cervical spine. Electronically Signed   By: Ronney Asters M.D.   On: 10/21/2022 20:51   DG Chest Port 1 View  Result Date: 10/25/2022 CLINICAL DATA:  Pneumonia and cough EXAM: PORTABLE CHEST 1 VIEW COMPARISON:  12/12/2019 FINDINGS: Loop recorder is present. Slightly shallow inspiration. Heart size and pulmonary vascularity are normal. Lungs are clear. No pleural effusions. No pneumothorax. Mediastinal contours appear intact. IMPRESSION: No active disease. Electronically Signed   By: Lucienne Capers M.D.   On: 10/29/2022 19:46   DG Knee 2 Views Left  Result Date: 11/06/2022 CLINICAL DATA:  Fall EXAM: LEFT KNEE - 1-2 VIEW COMPARISON:  None Available. FINDINGS: There is  no acute fracture or dislocation. Joint spaces are well maintained. There is no joint effusion. Peripheral vascular calcifications are present. There is bubbly cortical lucency in the proximal femoral diaphysis measuring 2.4 x 0.5 cm. IMPRESSION: 1. No acute fracture or dislocation. 2. Bubbly cortical lucency in the proximal femoral diaphysis measuring 2.4 x 0.5 cm. This is nonspecific and may represent a nonossifying fibroma. Electronically Signed   By: Ronney Asters M.D.   On: 10/30/2022 19:10       LOS: 1 day   [facility] Pager on [email or web address]  10/29/2022, 9:00 AM

## 2022-10-29 NOTE — Progress Notes (Signed)
  Echocardiogram 2D Echocardiogram has been performed.  Glen Martinez 10/29/2022, 2:33 PM

## 2022-10-29 NOTE — ED Notes (Signed)
Just got report from nurse, went into room b/c IV was beeping. Pt was almost in floor. CNA helped with getting pt up in bed, male p. Wick applied. C-collar and oxygen removed, also white pills removed from mouth. IV placed in hand. Pt is very comfortable now. Abrasions noted on BLE

## 2022-10-29 NOTE — Progress Notes (Signed)
PT Cancellation Note  Patient Details Name: Glen Martinez MRN: [id number] DOB: 1931/06/12   Cancelled Treatment:    Reason Eval/Treat Not Completed: Other (comment).  Pt was attempted this afternoon, first was in Echo procedure and then just flat refused as he wanted to wait until he was transported to his upstairs room.  Follow up at another time.   Ramond Dial 10/29/2022, 3:01 PM  Mee Hives, PT PhD Acute Rehab Dept. Number: [place] [facility] [place]

## 2022-10-29 NOTE — Progress Notes (Signed)
Carotid artery duplex has been completed. Preliminary results can be found in CV Proc through chart review.   10/29/22 11:05 AM Carlos Levering RVT

## 2022-10-29 NOTE — Evaluation (Signed)
Speech Language Pathology Evaluation Patient Details Name: Glen Martinez MRN: [id number] DOB: [birth year] Today's Date: 10/29/2022 Time: 9407-6808 SLP Time Calculation (min) (ACUTE ONLY): 19 min  Problem List:  Patient Active Problem List   Diagnosis Date Noted   Acute ischemic right MCA stroke ([place]) 10/12/2022   Acute renal failure superimposed on stage 3a chronic kidney disease ([place]) 10/19/2022   Leukocytosis 10/26/2022   Elevated LFTs 11/07/2022   OSA (obstructive sleep apnea) 11/04/2022   Paroxysmal atrial fibrillation ([place]) 08/10/2021   Secondary hypercoagulable state ([place]) 08/10/2021   Hemorrhagic stroke ([place]) 07/10/2021   Abnormal ankle brachial index (ABI) 07/03/2019   Benign prostatic hyperplasia 04/09/2018   Hyperlipidemia associated with type 2 diabetes mellitus ([place]) 04/09/2018   Hypertension associated with diabetes ([place]) 04/09/2018   Hypothyroidism 04/09/2018   Personal history of gout 07/18/2016   Type 2 diabetes mellitus with stage 3 chronic kidney disease, with long-term current use of insulin ([place]) 11/20/2015   ED (erectile dysfunction) of organic origin 08/01/2012   Elevated prostate specific antigen (PSA) 08/01/2012   Nodular prostate with urinary obstruction 08/01/2012   Incomplete emptying of bladder 08/01/2012   Chronic prostatitis 08/01/2012   Disorder of male genital organs 08/01/2012   Urinary tract infection 08/01/2012   Past Medical History:  Past Medical History:  Diagnosis Date   CVA (cerebral vascular accident) ([place]) 07/09/2021   No deficits   Diabetes mellitus without complication (HCC)    GERD (gastroesophageal reflux disease)    Gout    Hyperlipidemia    Hypertension    Hypothyroidism    PAF (paroxysmal atrial fibrillation) ([place])    Wears dentures    partial upper and lower   Past Surgical History:  Past Surgical History:  Procedure Laterality Date   CATARACT EXTRACTION W/PHACO Left 05/02/2018   Procedure: CATARACT EXTRACTION PHACO  AND INTRAOCULAR LENS PLACEMENT ([place]) DIABETIC;  Surgeon: Leandrew Koyanagi, MD;  Location: [facility];  Service: Ophthalmology;  Laterality: Left;  diabetic - insulin   CATARACT EXTRACTION W/PHACO Right 09/21/2022   Procedure: CATARACT EXTRACTION PHACO AND INTRAOCULAR LENS PLACEMENT ([facility]) RIGHT 12.41 01:15.0;  Surgeon: Leandrew Koyanagi, MD;  Location: [facility];  Service: Ophthalmology;  Laterality: Right;  Diabetic   COLONOSCOPY     HERNIA REPAIR     LOOP RECORDER INSERTION N/A 07/12/2021   Procedure: LOOP RECORDER INSERTION;  Surgeon: Evans Lance, MD;  Location: [facility];  Service: Cardiovascular;  Laterality: N/A;   TONSILLECTOMY     HPI:  Pt is a 89 y.o. male who presented to the ED for evaluation of left-sided weakness, slurred speech, and left facial droop. MRI brain (1/19) revealed "shows a large area of acute ischemia within the posterior right MCA territory.  Petechial hemorrhage within the infarcted territory noted". SLE (07/10/21) post CVA significant for cognitive communication deficits with SLUMS score of 13/30. Passed [facility] 10/22/2022, however RN reports concern for pocketing of pills. BSE and SLE ordered. PMH: PAF on Coumadin, history of CVA, insulin-dependent T2DM, CKD stage IIIa, HTN, HLD, hypothyroidism, BPH, OSA on CPAP.   Assessment / Plan / Recommendation Clinical Impression  Pt presents with dysarthria and suspected cognitive communication deficits post CVA. Severe L facial (CN VII) and lingual (CN XII) deficits, resulted in articulatory imprecision and reduced speech intelligibility at the word level (25%). With brief instruction and min verbal cues for overarticulation, slow rate and increase of vocal intensity, intelligibility improved to 80% with production of first and last name. Performance on most  language tasks was [facility], except for some mild difficulty with confrontational naming. He was oriented x4, but later asked if he was in the  hospital after previously providing this information to me independently. Intellectual awareness of speech and swallow deficits very poor. Please note, pt's reduced intelligibility of speech likely impacted this clinician's ability to obtain full/ accurate assessment of cognitive function this date. Recommend treatment for motor speech deficits and ongoing dx assessment of cognitive-linguistic function.    SLP Assessment  SLP Recommendation/Assessment: Patient needs continued Speech Lanaguage Pathology Services SLP Visit Diagnosis: Dysarthria and anarthria (R47.1);Cognitive communication deficit (R41.841)    Recommendations for follow up therapy are one component of a multi-disciplinary discharge planning process, led by the attending physician.  Recommendations may be updated based on patient status, additional functional criteria and insurance authorization.    Follow Up Recommendations  Follow physician's recommendations for discharge plan and follow up therapies    Assistance Recommended at Discharge     Functional Status Assessment Patient has had a recent decline in their functional status and demonstrates the ability to make significant improvements in function in a reasonable and predictable amount of time.  Frequency and Duration min 2x/week  2 weeks      SLP Evaluation Cognition  Overall Cognitive Status: No family/caregiver present to determine baseline cognitive functioning Arousal/Alertness: Awake/alert Orientation Level: Oriented X4 Year: 2024 Month: January Day of Week: Correct Attention: Sustained Sustained Attention: Appears intact Awareness: Impaired Awareness Impairment: Intellectual impairment Safety/Judgment: Impaired       Comprehension  Auditory Comprehension Overall Auditory Comprehension: Appears within functional limits for tasks assessed Yes/No Questions: Within Functional Limits Commands: Within Functional Limits Conversation: Simple Visual  Recognition/Discrimination Discrimination: Not tested Reading Comprehension Reading Status: Not tested    Expression Expression Primary Mode of Expression: Verbal Verbal Expression Initiation: No impairment Automatic Speech: Name;Day of week;Month of year Level of Generative/Spontaneous Verbalization: Sentence Naming: Impairment Responsive: 76-100% accurate Confrontation: Impaired Interfering Components: Speech intelligibility Effective Techniques: Semantic cues Written Expression Written Expression: Not tested   Oral / Motor  Oral Motor/Sensory Function Overall Oral Motor/Sensory Function: Severe impairment Facial ROM: Reduced left;Suspected CN VII (facial) dysfunction Facial Symmetry: Abnormal symmetry left;Suspected CN VII (facial) dysfunction Facial Strength: Reduced left;Suspected CN VII (facial) dysfunction Facial Sensation: Reduced left;Suspected CN V (Trigeminal) dysfunction Lingual ROM: Within Functional Limits Lingual Symmetry: Abnormal symmetry left;Suspected CN XII (hypoglossal) dysfunction Lingual Strength: Reduced;Suspected CN XII (hypoglossal) dysfunction Velum: Within Functional Limits Mandible: Within Functional Limits Motor Speech Overall Motor Speech: Impaired Respiration: Within functional limits Phonation: Normal Resonance: Within functional limits Articulation: Impaired Level of Impairment: Word Intelligibility: Intelligibility reduced Word: 25-49% accurate Phrase: 0-24% accurate Sentence: 0-24% accurate Conversation: 0-24% accurate Motor Planning: Witnin functional limits Motor Speech Errors: Unaware Effective Techniques: Increased vocal intensity;Over-articulate;Slow rate             Ellwood Dense, [place], [place] Office Number: [phone]  Acie Fredrickson 10/29/2022, 11:26 AM

## 2022-10-29 NOTE — Evaluation (Signed)
Clinical/Bedside Swallow Evaluation Patient Details  Name: Glen Martinez MRN: [id number] Date of Birth: Feb 21, 1931  Today's Date: 10/29/2022 Time: SLP Start Time (ACUTE ONLY): 8850 SLP Stop Time (ACUTE ONLY): 1018 SLP Time Calculation (min) (ACUTE ONLY): 19 min  Past Medical History:  Past Medical History:  Diagnosis Date   CVA (cerebral vascular accident) ([place]) 07/09/2021   No deficits   Diabetes mellitus without complication (HCC)    GERD (gastroesophageal reflux disease)    Gout    Hyperlipidemia    Hypertension    Hypothyroidism    PAF (paroxysmal atrial fibrillation) ([place])    Wears dentures    partial upper and lower   Past Surgical History:  Past Surgical History:  Procedure Laterality Date   CATARACT EXTRACTION W/PHACO Left 05/02/2018   Procedure: CATARACT EXTRACTION PHACO AND INTRAOCULAR LENS PLACEMENT ([place]) DIABETIC;  Surgeon: Leandrew Koyanagi, MD;  Location: [facility];  Service: Ophthalmology;  Laterality: Left;  diabetic - insulin   CATARACT EXTRACTION W/PHACO Right 09/21/2022   Procedure: CATARACT EXTRACTION PHACO AND INTRAOCULAR LENS PLACEMENT ([facility]) RIGHT 12.41 01:15.0;  Surgeon: Leandrew Koyanagi, MD;  Location: [facility];  Service: Ophthalmology;  Laterality: Right;  Diabetic   COLONOSCOPY     HERNIA REPAIR     LOOP RECORDER INSERTION N/A 07/12/2021   Procedure: LOOP RECORDER INSERTION;  Surgeon: Evans Lance, MD;  Location: [facility];  Service: Cardiovascular;  Laterality: N/A;   TONSILLECTOMY     HPI:  Pt is a 89 y.o. male who presented to the ED for evaluation of left-sided weakness, slurred speech, and left facial droop. MRI brain (1/19) revealed "shows a large area of acute ischemia within the posterior right MCA territory.  Petechial hemorrhage within the infarcted territory noted". SLE (07/10/21) post CVA significant for cognitive communication deficits with SLUMS score of 13/30. Passed [facility] 11/03/2022, however RN reports  concern for pocketing of pills. BSE and SLE ordered. PMH: PAF on Coumadin, history of CVA, insulin-dependent T2DM, CKD stage IIIa, HTN, HLD, hypothyroidism, BPH, OSA on CPAP.    Assessment / Plan / Recommendation  Clinical Impression  Pt presents with clinical indicators concerning for oropharyngeal dysphagia post CVA. Oral mechanism examination significant for L facial deficits (CN VII) affecting ROM, symmetry, strength and sensation. Lingual strength also reduced, likely related to CN XII dysfunction. Thin liquids via large and consecutive straw sips were with immediate coughing and wet vocal quality. Cueing for small sips by cup initially eliminated presentation, but pt with eventual delayed cough concerning for airway compromise. Sips of NTL by cup/straw trialed, with controlled small sips by cup effective in eliminating s/sx of aspiration. Pt impulsive and with difficulty controlling for small sips (at times) when self-feeding which would result in intermittent throat clearing and L anterior loss of liquids.  Anterior loss, reduced lingual coordination and mastication of solids evident, primarily with regular textures. Min residuals of puree in L buccal cavity were cleared with cued lingual sweep. Recommend initiate dys 1 diet/NTL (no straws) with 1:1 supervision for all meals/intake. Instrumental swallow assessment (MBS) warranted and tentatively planned for early next week or as radiology schedule allows.  SLP Visit Diagnosis: Dysphagia, unspecified (R13.10)    Aspiration Risk  Moderate aspiration risk    Diet Recommendation Dysphagia 1 (Puree);Nectar-thick liquid   Liquid Administration via: Cup;No straw Medication Administration: Crushed with puree Supervision: Staff to assist with self feeding;Full supervision/cueing for compensatory strategies Compensations: Minimize environmental distractions;Slow rate;Small sips/bites;Lingual sweep for clearance of pocketing;Monitor for anterior  loss Postural Changes: Seated upright at 90 degrees    Other  Recommendations Oral Care Recommendations: Oral care BID;Staff/trained caregiver to provide oral care Other Recommendations: Order thickener from pharmacy    Recommendations for follow up therapy are one component of a multi-disciplinary discharge planning process, led by the attending physician.  Recommendations may be updated based on patient status, additional functional criteria and insurance authorization.  Follow up Recommendations Follow physician's recommendations for discharge plan and follow up therapies      Assistance Recommended at Discharge    Functional Status Assessment Patient has had a recent decline in their functional status and demonstrates the ability to make significant improvements in function in a reasonable and predictable amount of time.  Frequency and Duration min 2x/week  2 weeks       Prognosis Prognosis for Safe Diet Advancement: Good Barriers to Reach Goals: Severity of deficits;Cognitive deficits      Swallow Study   General Date of Onset: 10/19/2022 HPI: Pt is a 89 y.o. male who presented to the ED for evaluation of left-sided weakness, slurred speech, and left facial droop. MRI brain (1/19) revealed "shows a large area of acute ischemia within the posterior right MCA territory.  Petechial hemorrhage within the infarcted territory noted". SLE (07/10/21) post CVA significant for cognitive communication deficits with SLUMS score of 13/30. Passed [facility] 11/09/2022, however RN reports concern for pocketing of pills. BSE and SLE ordered. PMH: PAF on Coumadin, history of CVA, insulin-dependent T2DM, CKD stage IIIa, HTN, HLD, hypothyroidism, BPH, OSA on CPAP. Type of Study: Bedside Swallow Evaluation Previous Swallow Assessment: none per EMR Diet Prior to this Study: NPO Temperature Spikes Noted: No Respiratory Status: Room air History of Recent Intubation: No Behavior/Cognition:  Alert;Cooperative;Pleasant mood;Confused Oral Cavity Assessment: Dry Oral Care Completed by SLP: No Oral Cavity - Dentition: Edentulous;Dentures, not available Vision: Impaired for self-feeding Self-Feeding Abilities: Needs assist Patient Positioning: Upright in bed;Postural control adequate for testing Baseline Vocal Quality: Normal Volitional Cough: Strong Volitional Swallow: Unable to elicit    Oral/Motor/Sensory Function Overall Oral Motor/Sensory Function: Severe impairment Facial ROM: Reduced left;Suspected CN VII (facial) dysfunction Facial Symmetry: Abnormal symmetry left;Suspected CN VII (facial) dysfunction Facial Strength: Reduced left;Suspected CN VII (facial) dysfunction Facial Sensation: Reduced left;Suspected CN V (Trigeminal) dysfunction Lingual ROM: Within Functional Limits Lingual Symmetry: Abnormal symmetry left;Suspected CN XII (hypoglossal) dysfunction Lingual Strength: Reduced;Suspected CN XII (hypoglossal) dysfunction Velum: Within Functional Limits Mandible: Within Functional Limits   Ice Chips Ice chips: Not tested   Thin Liquid Thin Liquid: Impaired Presentation: Cup;Self Fed;Straw Oral Phase Functional Implications: Left anterior spillage Pharyngeal  Phase Impairments: Multiple swallows;Suspected delayed Swallow;Wet Vocal Quality;Throat Clearing - Immediate;Cough - Immediate;Cough - Delayed    Nectar Thick Nectar Thick Liquid: Impaired Presentation: Cup;Straw;Self Fed Oral phase functional implications: Left anterior spillage Pharyngeal Phase Impairments: Throat Clearing - Immediate;Suspected delayed Swallow   Honey Thick Honey Thick Liquid: Not tested   Puree Puree: Impaired Presentation: Self Fed;Spoon Oral Phase Functional Implications: Left lateral sulci pocketing;Left anterior spillage Pharyngeal Phase Impairments: Multiple swallows   Solid     Solid: Impaired Presentation: Self Fed Oral Phase Impairments: Reduced labial seal;Reduced lingual  movement/coordination;Impaired mastication Oral Phase Functional Implications: Left anterior spillage;Impaired mastication       Ellwood Dense, MA, [place] Office Number: [phone]  Acie Fredrickson 10/29/2022,11:05 AM

## 2022-10-29 NOTE — ED Notes (Signed)
Notified MD of BP 149/116

## 2022-10-29 NOTE — Plan of Care (Signed)
Pt with only slight dysarthria at beginning of shift but progressiv ely worsened as night progressed at pt got tired. Pt had difficulty moving Nectar thick liquids and applesauc e from front of mouth to back of throat and then swallowing b ut did get it accomplished with time.

## 2022-10-29 NOTE — Progress Notes (Signed)
STROKE TEAM PROGRESS NOTE   SUBJECTIVE (INTERVAL HISTORY) His daughter is at the bedside.  Overall his condition is unchanged.  Patient still has left facial droop, moderate to severe dysarthria, left hemiparesis and left hemianopia.  Patient stated compliance with Coumadin at home, however INR only 1.2 on admission.   OBJECTIVE Temp:  [98.2 F (36.8 C)-98.8 F (37.1 C)] 98.8 F (37.1 C) (01/20 1614) Pulse Rate:  [73-132] 105 (01/20 1218) Resp:  [11-26] 15 (01/20 1614) BP: (132-180)/(75-116) 136/83 (01/20 1614) SpO2:  [94 %-100 %] 97 % (01/20 1614) Weight:  [72.6 kg] 72.6 kg (01/19 1834)  Recent Labs  Lab 10/26/2022 2351 10/29/22 0325 10/29/22 0830 10/29/22 1230 10/29/22 1808  GLUCAP 176* 130* 135* 114* 227*   Recent Labs  Lab 11/02/2022 1919 10/16/2022 2000 10/29/22 0507  NA 139 140 140  K 4.1 3.5 3.4*  CL 104 101 104  CO2  --  21* 22  GLUCOSE 199* 196* 124*  BUN 43* 29* 37*  CREATININE 2.40* 2.29* 2.05*  CALCIUM  --  8.8* 8.3*   Recent Labs  Lab 11/02/2022 2000 10/29/22 0507  AST 223* 198*  ALT 59* 58*  ALKPHOS 92 83  BILITOT 1.5* 0.9  PROT 7.4 6.5  ALBUMIN 4.0 3.6   Recent Labs  Lab 10/31/2022 1840 11/06/2022 1919 10/29/22 0507  WBC 22.6*  --  18.1*  NEUTROABS 19.6*  --   --   HGB 15.6 16.7 14.5  HCT 47.4 49.0 42.3  MCV 93.9  --  91.6  PLT 199  --  164   Recent Labs  Lab 10/20/2022 2000  CKTOTAL 11,581*   Recent Labs    10/12/2022 1840 10/29/22 0507  LABPROT 14.9 15.0  INR 1.2 1.2   Recent Labs    10/30/2022 2014  COLORURINE YELLOW  LABSPEC 1.017  PHURINE 5.0  GLUCOSEU NEGATIVE  HGBUR LARGE*  BILIRUBINUR NEGATIVE  KETONESUR 5*  PROTEINUR 100*  NITRITE NEGATIVE  LEUKOCYTESUR NEGATIVE       Component Value Date/Time   CHOL 172 10/29/2022 0507   TRIG 102 10/29/2022 0507   HDL 56 10/29/2022 0507   CHOLHDL 3.1 10/29/2022 0507   VLDL 20 10/29/2022 0507   LDLCALC NOT CALCULATED 10/29/2022 0507   Lab Results  Component Value Date   HGBA1C  6.5 (H) 10/29/2022      Component Value Date/Time   LABOPIA NONE DETECTED 10/19/2022 2014   COCAINSCRNUR NONE DETECTED 11/09/2022 2014   LABBENZ NONE DETECTED 10/30/2022 2014   AMPHETMU NONE DETECTED 10/29/2022 2014   THCU NONE DETECTED 10/20/2022 2014   LABBARB NONE DETECTED 11/07/2022 2014    Recent Labs  Lab 10/29/2022 1840  ETH <10    I have personally reviewed the radiological images below and agree with the radiology interpretations.  ECHOCARDIOGRAM COMPLETE  Result Date: 10/29/2022    ECHOCARDIOGRAM REPORT   Patient Name:   Glen Martinez Date of Exam: 10/29/2022 Medical Rec #:  [id number]       Height:       66.0 in Accession #:    [id number]      Weight:       160.0 lb Date of Birth:  Dec 03, 1930      BSA:          1.819 m Patient Age:    16 years        BP:           149/79 mmHg Patient Gender: M  HR:           118 bpm. Exam Location:  Inpatient Procedure: 2D Echo Indications:    stroke  History:        Patient has prior history of Echocardiogram examinations, most                 recent 07/10/2021. Stroke, Arrythmias:Atrial Fibrillation; Risk                 Factors:Hypertension, Diabetes and Dyslipidemia.  Sonographer:    Harvie Junior Referring Phys: [id number] [place]  1. In Afib with RVR during study. Left ventricular ejection fraction, by estimation, is 50 to 55%. The left ventricle has low normal function. The left ventricle has no regional wall motion abnormalities. There is mild left ventricular hypertrophy. Left  ventricular diastolic parameters are indeterminate.  2. Right ventricular systolic function is normal. The right ventricular size is normal. There is normal pulmonary artery systolic pressure. The estimated right ventricular systolic pressure is 76.1 mmHg.  3. The mitral valve is normal in structure. Trivial mitral valve regurgitation.  4. The aortic valve is tricuspid. Aortic valve regurgitation is mild. Aortic valve sclerosis is present,  with no evidence of aortic valve stenosis.  5. The inferior vena cava is normal in size with greater than 50% respiratory variability, suggesting right atrial pressure of 3 mmHg. FINDINGS  Left Ventricle: Left ventricular ejection fraction, by estimation, is 50 to 55%. The left ventricle has low normal function. The left ventricle has no regional wall motion abnormalities. The left ventricular internal cavity size was normal in size. There is mild left ventricular hypertrophy. Left ventricular diastolic parameters are indeterminate. Right Ventricle: The right ventricular size is normal. No increase in right ventricular wall thickness. Right ventricular systolic function is normal. There is normal pulmonary artery systolic pressure. The tricuspid regurgitant velocity is 2.33 m/s, and  with an assumed right atrial pressure of 3 mmHg, the estimated right ventricular systolic pressure is 60.7 mmHg. Left Atrium: Left atrial size was normal in size. Right Atrium: Right atrial size was normal in size. Pericardium: There is no evidence of pericardial effusion. Mitral Valve: The mitral valve is normal in structure. Trivial mitral valve regurgitation. Tricuspid Valve: The tricuspid valve is normal in structure. Tricuspid valve regurgitation is trivial. Aortic Valve: The aortic valve is tricuspid. Aortic valve regurgitation is mild. Aortic regurgitation PHT measures 579 msec. Aortic valve sclerosis is present, with no evidence of aortic valve stenosis. Aortic valve mean gradient measures 1.2 mmHg. Aortic valve peak gradient measures 2.1 mmHg. Aortic valve area, by VTI measures 2.83 cm. Pulmonic Valve: The pulmonic valve was not well visualized. Pulmonic valve regurgitation is trivial. Aorta: The aortic root and ascending aorta are structurally normal, with no evidence of dilitation. Venous: The inferior vena cava is normal in size with greater than 50% respiratory variability, suggesting right atrial pressure of 3 mmHg.  IAS/Shunts: The interatrial septum was not well visualized.  LEFT VENTRICLE PLAX 2D LVIDd:         4.30 cm     Diastology LVIDs:         3.20 cm     LV e' medial:    6.05 cm/s LV PW:         1.20 cm     LV E/e' medial:  12.9 LV IVS:        1.20 cm     LV e' lateral:   8.50 cm/s LVOT diam:  2.00 cm     LV E/e' lateral: 9.2 LV SV:         34 LV SV Index:   19 LVOT Area:     3.14 cm  LV Volumes (MOD) LV vol d, MOD A2C: 49.0 ml LV vol d, MOD A4C: 57.5 ml LV vol s, MOD A2C: 23.2 ml LV vol s, MOD A4C: 28.6 ml LV SV MOD A2C:     25.8 ml LV SV MOD A4C:     57.5 ml LV SV MOD BP:      29.0 ml RIGHT VENTRICLE RV Basal diam:  2.90 cm RV Mid diam:    2.40 cm RV S prime:     11.30 cm/s TAPSE (M-mode): 1.5 cm LEFT ATRIUM             Index        RIGHT ATRIUM           Index LA diam:        4.10 cm 2.25 cm/m   RA Area:     11.60 cm LA Vol (A2C):   35.7 ml 19.63 ml/m  RA Volume:   25.00 ml  13.74 ml/m LA Vol (A4C):   56.3 ml 30.95 ml/m LA Biplane Vol: 45.9 ml 25.24 ml/m  AORTIC VALVE                    PULMONIC VALVE AV Area (Vmax):    2.77 cm     PV Vmax:          0.72 m/s AV Area (Vmean):   2.74 cm     PV Peak grad:     2.1 mmHg AV Area (VTI):     2.83 cm     PR End Diast Vel: 5.57 msec AV Vmax:           72.40 cm/s AV Vmean:          52.450 cm/s AV VTI:            0.119 m AV Peak Grad:      2.1 mmHg AV Mean Grad:      1.2 mmHg LVOT Vmax:         63.92 cm/s LVOT Vmean:        45.680 cm/s LVOT VTI:          0.107 m LVOT/AV VTI ratio: 0.90 AI PHT:            579 msec  AORTA Ao Root diam: 3.60 cm Ao Asc diam:  3.20 cm MITRAL VALVE               TRICUSPID VALVE MV Area (PHT): 3.39 cm    TR Peak grad:   21.7 mmHg MV Decel Time: 224 msec    TR Vmax:        233.00 cm/s MR Peak grad: 28.3 mmHg MR Vmax:      266.00 cm/s  SHUNTS MV E velocity: 78.00 cm/s  Systemic VTI:  0.11 m MV A velocity: 20.10 cm/s  Systemic Diam: 2.00 cm MV E/A ratio:  3.88 Oswaldo Milian MD Electronically signed by Oswaldo Milian MD Signature  Date/Time: 10/29/2022/3:45:51 PM    Final    VAS US CAROTID  Result Date: 10/29/2022 Carotid Arterial Duplex Study Patient Name:  Glen Martinez  Date of Exam:   10/29/2022 Medical Rec #: [id number]        Accession #:    [id number] Date of Birth: Sep 27, 1931  Patient Gender: M Patient Age:   89 years Exam Location:  [facility] Procedure:      VAS US CAROTID Referring Phys: Alferd P[facility] --------------------------------------------------------------------------------  Indications:       CVA. Risk Factors:      Hypertension, hyperlipidemia, Diabetes. Comparison Study:  No prior studies. Performing Technologist: Oliver Hum RVT  Examination Guidelines: A complete evaluation includes B-mode imaging, spectral Doppler, color Doppler, and power Doppler as needed of all accessible portions of each vessel. Bilateral testing is considered an integral part of a complete examination. Limited examinations for reoccurring indications may be performed as noted.  Right Carotid Findings: +----------+--------+--------+--------+-----------------------+--------+           PSV cm/sEDV cm/sStenosisPlaque Description     Comments +----------+--------+--------+--------+-----------------------+--------+ CCA Prox  83      14              smooth and heterogenous         +----------+--------+--------+--------+-----------------------+--------+ CCA Distal65      17              smooth and heterogenous         +----------+--------+--------+--------+-----------------------+--------+ ICA Prox  25      7               heterogenous and smooth         +----------+--------+--------+--------+-----------------------+--------+ ICA Distal42      12                                     tortuous +----------+--------+--------+--------+-----------------------+--------+ ECA       92      1                                                +----------+--------+--------+--------+-----------------------+--------+ +----------+--------+-------+--------+-------------------+           PSV cm/sEDV cmsDescribeArm Pressure (mmHG) +----------+--------+-------+--------+-------------------+ Subclavian89                                         +----------+--------+-------+--------+-------------------+ +---------+--------+--+--------+-+---------+ VertebralPSV cm/s24EDV cm/s5Antegrade +---------+--------+--+--------+-+---------+  Left Carotid Findings: +----------+--------+--------+--------+-----------------------+--------+           PSV cm/sEDV cm/sStenosisPlaque Description     Comments +----------+--------+--------+--------+-----------------------+--------+ CCA Prox  94      14              smooth and heterogenous         +----------+--------+--------+--------+-----------------------+--------+ CCA Distal72      11              smooth and heterogenous         +----------+--------+--------+--------+-----------------------+--------+ ICA Prox  33      12              smooth and heterogenous         +----------+--------+--------+--------+-----------------------+--------+ ICA Distal37      18                                     tortuous +----------+--------+--------+--------+-----------------------+--------+ ECA       61      7                                               +----------+--------+--------+--------+-----------------------+--------+ +----------+--------+--------+--------+-------------------+  PSV cm/sEDV cm/sDescribeArm Pressure (mmHG) +----------+--------+--------+--------+-------------------+ Subclavian110                                         +----------+--------+--------+--------+-------------------+ +---------+--------+--+--------+--+---------+ VertebralPSV cm/s25EDV cm/s10Antegrade +---------+--------+--+--------+--+---------+   Summary: Right Carotid:  Velocities in the right ICA are consistent with a 1-39% stenosis. Left Carotid: Velocities in the left ICA are consistent with a 1-39% stenosis. Vertebrals: Bilateral vertebral arteries demonstrate antegrade flow. *See table(s) above for measurements and observations.     Preliminary    MR ANGIO HEAD WO CONTRAST  Result Date: 11/07/2022 CLINICAL DATA:  Acute neurologic deficit EXAM: MRA HEAD WITHOUT CONTRAST TECHNIQUE: Angiographic images of the Circle of Willis were acquired using MRA technique without intravenous contrast. COMPARISON:  None Available. FINDINGS: POSTERIOR CIRCULATION: --Vertebral arteries: Normal --Inferior cerebellar arteries: Normal. --Basilar artery: Normal. --Superior cerebellar arteries: Normal. --Posterior cerebral arteries: Normal. ANTERIOR CIRCULATION: --Intracranial internal carotid arteries: Normal. --Anterior cerebral arteries (ACA): Normal. --Middle cerebral arteries (MCA): Normal. IMPRESSION: Normal intracranial MRA. Electronically Signed   By: Ulyses Jarred M.D.   On: 11/04/2022 23:59   CT Head Wo Contrast  Result Date: 10/26/2022 CLINICAL DATA:  Neurologic deficit.  Stroke suspected. EXAM: CT HEAD WITHOUT CONTRAST TECHNIQUE: Contiguous axial images were obtained from the base of the skull through the vertex without intravenous contrast. RADIATION DOSE REDUCTION: This exam was performed according to the departmental dose-optimization program which includes automated exposure control, adjustment of the mA and/or kV according to patient size and/or use of iterative reconstruction technique. COMPARISON:  Head CT dated 07/10/2021. FINDINGS: Evaluation of this exam is limited due to motion artifact. Brain: An area of hypodensity in the right posterior temporal and parietal lobes consistent with edema and infarct. There is minimal associated mass effect on the adjacent brain parenchyma. No midline shift. Areas of slight higher attenuation within the infarct suspicious for petechial  hemorrhage. Evaluation is limited due to motion. There is background of mild age-related atrophy and chronic microvascular ischemic changes. Vascular: Suboptimally evaluated due to motion. Skull: Normal. Negative for fracture or focal lesion. Sinuses/Orbits: Partial opacification of a left ethmoid air cells. The remainder of the visualized paranasal sinuses and mastoid air cells are clear. Other: None IMPRESSION: 1. Right MCA territory infarct with findings suspicious for small petechial hemorrhage. No midline shift. 2. Mild age-related atrophy and chronic microvascular ischemic changes. These results were called by telephone at the time of interpretation on 10/30/2022 at 9:32 pm to Dr Alvino Chapel, who verbally acknowledged these results. Electronically Signed   By: Anner Crete M.D.   On: 10/24/2022 21:34   MR BRAIN WO CONTRAST  Result Date: 11/07/2022 CLINICAL DATA:  Acute neurologic deficit EXAM: MRI HEAD WITHOUT CONTRAST TECHNIQUE: Multiplanar, multiecho pulse sequences of the brain and surrounding structures were obtained without intravenous contrast. COMPARISON:  07/09/2021 FINDINGS: Brain: There is a large area of acute ischemia within the posterior right MCA territory. There is petechial hemorrhage within the infarcted territory. 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 There is multifocal hyperintense T2-weighted signal within the periventricular and deep white matter. Old right PCA territory infarct. Normal midline structures. Vascular: Normal flow voids. Skull and upper cervical spine: Normal marrow signal. Sinuses/Orbits: Paranasal sinuses are clear. No mastoid effusion. Normal orbits. Bilateral ocular lens replacements. Other: Negative IMPRESSION: 1. Large area of acute ischemia within the posterior right MCA territory. Petechial hemorrhage within the infarcted territory. No  mass effect or midline shift. Heidelberg classification 1a: HI1, scattered small petechiae, no  mass effect. 2. Old right PCA territory infarct. Electronically Signed   By: Ulyses Jarred M.D.   On: 11/02/2022 21:25   CT Cervical Spine Wo Contrast  Result Date: 10/20/2022 CLINICAL DATA:  Neck trauma EXAM: CT CERVICAL SPINE WITHOUT CONTRAST TECHNIQUE: Multidetector CT imaging of the cervical spine was performed without intravenous contrast. Multiplanar CT image reconstructions were also generated. RADIATION DOSE REDUCTION: This exam was performed according to the departmental dose-optimization program which includes automated exposure control, adjustment of the mA and/or kV according to patient size and/or use of iterative reconstruction technique. COMPARISON:  None Available. FINDINGS: Alignment: Normal. Skull base and vertebrae: No acute fracture identified. No focal osseous lesion. Soft tissues and spinal canal: No prevertebral fluid or swelling. No visible canal hematoma. Disc levels: Large anterior osteophytes are seen at C4, C5, C6 and C7. Disc spaces are preserved. No severe central canal or neural foraminal stenosis identified at any level. Upper chest: Negative. Other: None. IMPRESSION: 1. No acute fracture or subluxation of the cervical spine. 2. Degenerative changes of the cervical spine. Electronically Signed   By: Ronney Asters M.D.   On: 10/13/2022 20:51   DG Chest Port 1 View  Result Date: 10/22/2022 CLINICAL DATA:  Pneumonia and cough EXAM: PORTABLE CHEST 1 VIEW COMPARISON:  12/12/2019 FINDINGS: Loop recorder is present. Slightly shallow inspiration. Heart size and pulmonary vascularity are normal. Lungs are clear. No pleural effusions. No pneumothorax. Mediastinal contours appear intact. IMPRESSION: No active disease. Electronically Signed   By: Lucienne Capers M.D.   On: 10/16/2022 19:46   DG Knee 2 Views Left  Result Date: 11/05/2022 CLINICAL DATA:  Fall EXAM: LEFT KNEE - 1-2 VIEW COMPARISON:  None Available. FINDINGS: There is no acute fracture or dislocation. Joint spaces are  well maintained. There is no joint effusion. Peripheral vascular calcifications are present. There is bubbly cortical lucency in the proximal femoral diaphysis measuring 2.4 x 0.5 cm. IMPRESSION: 1. No acute fracture or dislocation. 2. Bubbly cortical lucency in the proximal femoral diaphysis measuring 2.4 x 0.5 cm. This is nonspecific and may represent a nonossifying fibroma. Electronically Signed   By: Ronney Asters M.D.   On: 10/27/2022 19:10   CUP PACEART REMOTE DEVICE CHECK  Result Date: 10/05/2022 ILR summary report received. Battery status OK. Normal device function. No new symptom, tachy, brady, or pause episodes. Persistent AF/AFL, controlled rates, burden 86.6%, Eliquis Monthly summary reports and ROV/PRN LA    PHYSICAL EXAM  Temp:  [98.2 F (36.8 C)-98.8 F (37.1 C)] 98.8 F (37.1 C) (01/20 1614) Pulse Rate:  [73-132] 105 (01/20 1218) Resp:  [11-26] 15 (01/20 1614) BP: (132-180)/(75-116) 136/83 (01/20 1614) SpO2:  [94 %-100 %] 97 % (01/20 1614) Weight:  [72.6 kg] 72.6 kg (01/19 1834)  General - Well nourished, well developed, in no apparent distress.  Ophthalmologic - fundi not visualized due to noncooperation.  Cardiovascular - irregularly irregular heart rate and rhythm.  Neuro - awake, alert, eyes open, orientated to age, place, time and people. No aphasia but paucity of speech and severe dysarthria, following all simple commands. Able to name and repeat in severe dysarthric voice. No gaze palsy but right gaze preference, left hemianopia.  Left facial droop. Tongue midline.  Right upper and lower extremity at least 4/5, left upper extremity3/5, left lower extremity 3+/5. Sensation symmetrical bilaterally subjectively, right FTN intact, gait not tested.     ASSESSMENT/PLAN Mr. Muath  DARRIOUS YOUMAN is a 89 y.o. male with history of hypertension, hyperlipidemia, diabetes, A-fib on Coumadin, stroke admitted for left-sided weakness, left facial droop, slurred speech and fell at  home. No tPA given due to outside window.    Stroke:  right MCA large infarct with petechial hemorrhage, embolic pattern, secondary to A-fib on Coumadin with subtherapeutic INR CT right MCA large infarct with petechial hemorrhage, old right PCA infarct MRI right MCA large infarct with HI1 hemorrhagic transformation, questionable right cerebellar punctate infarct, old right PCA infarct. MRA unremarkable Carotid Doppler unremarkable 2D Echo EF 50 to 55% LDL 96 HgbA1c 6.5 UDS negative Heparin subcu for VTE prophylaxis warfarin daily prior to admission, now on aspirin 325 mg daily.  Given the size of the stroke and HT, will consider eliquis in 5 to 7 days post stroke. Patient counseled to be compliant with his antithrombotic medications Ongoing aggressive stroke risk factor management Therapy recommendations: Pending Disposition: Pending  History of stroke 07/2021 admitted for dizziness and fall.  MRI showed large right PCA infarct with HI 2 hemorrhagic transformation as well as small left PCA infarct.  CTA head and neck right P3 occlusion.  EF 65 to 70%.  No DVT.  LDL 42, A1c 7.2.  Loop recorder placed.  Discharged on aspirin 81 and Zocor 40  A-fib with RVR A-fib was found after loop placement Started on Eliquis first Eliquis later changed to Coumadin due to cost INR 1.2 on admission Currently on aspirin 325 On metoprolol Will consider Eliquis 5 to 7 days post stroke Need social work consult for financial assistance  Diabetes HgbA1c 6.5 goal < 7.0 Controlled CBG monitoring SSI DM education and close PCP follow up  Hypertension Stable BP goal <160 Long term BP goal normotensive  Hyperlipidemia Home meds: Zocor 40 LDL 96, goal < 70 Now on Crestor 20 Continue statin at discharge  Other Stroke Risk Factors Advanced age  Other Active Problems AKI on CKD 3a, Cre 2.40-2.05, on IV fluid @ 100 For cytosis WBC 22.6-18.1  Hospital day # 1    Rosalin Hawking, MD PhD Stroke  Neurology 10/29/2022 6:24 PM    To contact Stroke Continuity provider, please refer to [email or web address] After hours, contact General Neurology

## 2022-10-30 DIAGNOSIS — N179 Acute kidney failure, unspecified: Secondary | ICD-10-CM | POA: Diagnosis not present

## 2022-10-30 DIAGNOSIS — I63511 Cerebral infarction due to unspecified occlusion or stenosis of right middle cerebral artery: Secondary | ICD-10-CM | POA: Diagnosis not present

## 2022-10-30 DIAGNOSIS — T796XXA Traumatic ischemia of muscle, initial encounter: Secondary | ICD-10-CM | POA: Diagnosis not present

## 2022-10-30 DIAGNOSIS — I4891 Unspecified atrial fibrillation: Secondary | ICD-10-CM | POA: Diagnosis not present

## 2022-10-30 LAB — GLUCOSE, CAPILLARY
Glucose-Capillary: 123 mg/dL — ABNORMAL HIGH (ref 70–99)
Glucose-Capillary: 133 mg/dL — ABNORMAL HIGH (ref 70–99)
Glucose-Capillary: 133 mg/dL — ABNORMAL HIGH (ref 70–99)
Glucose-Capillary: 140 mg/dL — ABNORMAL HIGH (ref 70–99)
Glucose-Capillary: 149 mg/dL — ABNORMAL HIGH (ref 70–99)
Glucose-Capillary: 179 mg/dL — ABNORMAL HIGH (ref 70–99)
Glucose-Capillary: 82 mg/dL (ref 70–99)

## 2022-10-30 LAB — COMPREHENSIVE METABOLIC PANEL
ALT: 54 U/L — ABNORMAL HIGH (ref 0–44)
AST: 134 U/L — ABNORMAL HIGH (ref 15–41)
Albumin: 3.2 g/dL — ABNORMAL LOW (ref 3.5–5.0)
Alkaline Phosphatase: 75 U/L (ref 38–126)
Anion gap: 12 (ref 5–15)
BUN: 33 mg/dL — ABNORMAL HIGH (ref 8–23)
CO2: 18 mmol/L — ABNORMAL LOW (ref 22–32)
Calcium: 8.1 mg/dL — ABNORMAL LOW (ref 8.9–10.3)
Chloride: 109 mmol/L (ref 98–111)
Creatinine, Ser: 1.5 mg/dL — ABNORMAL HIGH (ref 0.61–1.24)
GFR, Estimated: 44 mL/min — ABNORMAL LOW (ref >60)
Glucose, Bld: 103 mg/dL — ABNORMAL HIGH (ref 70–99)
Potassium: 4.8 mmol/L (ref 3.5–5.1)
Sodium: 139 mmol/L (ref 135–145)
Total Bilirubin: 1.4 mg/dL — ABNORMAL HIGH (ref 0.3–1.2)
Total Protein: 6.1 g/dL — ABNORMAL LOW (ref 6.5–8.1)

## 2022-10-30 LAB — HEPATITIS PANEL, ACUTE
HCV Ab: NONREACTIVE
Hep A IgM: NONREACTIVE
Hep B C IgM: NONREACTIVE
Hepatitis B Surface Ag: NONREACTIVE

## 2022-10-30 LAB — CBC
HCT: 42.8 % (ref 39.0–52.0)
Hemoglobin: 14.2 g/dL (ref 13.0–17.0)
MCH: 31.5 pg (ref 26.0–34.0)
MCHC: 33.2 g/dL (ref 30.0–36.0)
MCV: 94.9 fL (ref 80.0–100.0)
Platelets: 142 10*3/uL — ABNORMAL LOW (ref 150–400)
RBC: 4.51 MIL/uL (ref 4.22–5.81)
RDW: 14 % (ref 11.5–15.5)
WBC: 14.8 10*3/uL — ABNORMAL HIGH (ref 4.0–10.5)
nRBC: 0 % (ref 0.0–0.2)

## 2022-10-30 LAB — URINE CULTURE: Culture: NO GROWTH

## 2022-10-30 LAB — CK: Total CK: 4099 U/L — ABNORMAL HIGH (ref 49–397)

## 2022-10-30 MED ORDER — LEVOTHYROXINE SODIUM 88 MCG PO TABS
88.0000 ug | ORAL_TABLET | Freq: Every day | ORAL | Status: DC
  Administered 2022-10-30 – 2022-10-31 (×2): 88 ug via ORAL
  Filled 2022-10-30 (×2): qty 1

## 2022-10-30 NOTE — Evaluation (Signed)
Occupational Therapy Evaluation Patient Details Name: Glen Martinez MRN: [id number] DOB: Oct 08, 1931 Today's Date: 10/30/2022   History of Present Illness Pt is an 89 y.o. male presenting via EMS 1/19 with facial droop, weakness, and slurred speech following a fall. MRI revealed large area of acute ischemia within the R MCA territory with petechial hemorrhage wuthin the infarcted territory. PMH significant for R PCA infarct, DM2, GERD, HLD, HTN, hypothyroidism, gout.   Clinical Impression   PTA, pt was independent and lived alone. Upon eval, pt presents with LUE weakness, decreased sensation, decreased coordination, balance, safety, awareness, attention to L side, and vision. Pt performing UB ADL with up to mod A and LB ADL with up to max A. STS transfers with min A +2 and side steps toward [facility] with mod A +2 and step by step verbal cues for sequencing. Pt endorses decreased attention to L side, ability to visually scan L and decreased peripheral vision on the L. Pt would be a candidate for AIR if family could be with him 24/7 at [place]. Currently recommending SNF as support available is unclear.      Recommendations for follow up therapy are one component of a multi-disciplinary discharge planning process, led by the attending physician.  Recommendations may be updated based on patient status, additional functional criteria and insurance authorization.   Follow Up Recommendations  Skilled nursing-short term rehab (<3 hours/day)     Assistance Recommended at Discharge Frequent or constant Supervision/Assistance  Patient can return home with the following Two people to help with walking and/or transfers;A lot of help with bathing/dressing/bathroom;Assistance with cooking/housework;Assistance with feeding;Direct supervision/assist for medications management;Direct supervision/assist for financial management;Assist for transportation;Help with stairs or ramp for entrance    Functional Status  Assessment  Patient has had a recent decline in their functional status and demonstrates the ability to make significant improvements in function in a reasonable and predictable amount of time.  Equipment Recommendations  Other (comment) (defer)    Recommendations for Other Services       Precautions / Restrictions Precautions Precautions: Fall Restrictions Weight Bearing Restrictions: No      Mobility Bed Mobility Overal bed mobility: Needs Assistance Bed Mobility: Supine to Sit     Supine to sit: Min assist     General bed mobility comments: assist to raise torso; educated to use RLE to advance LLE. Moving toward L side this session    Transfers Overall transfer level: Needs assistance Equipment used: None Transfers: Sit to/from Stand, Bed to chair/wheelchair/BSC Sit to Stand: Min assist, +2 physical assistance           General transfer comment: min A +2 to rise. Mod A for side steps toward L. OT facilitation to advance LLE      Balance Overall balance assessment: Needs assistance Sitting-balance support: No upper extremity supported, Feet supported Sitting balance-Leahy Scale: Poor Sitting balance - Comments: slight lean to left; with cues can correct, but needs intermittent min A during strength testing and moving LE into figure 4 position Postural control: Left lateral lean Standing balance support: Single extremity supported, During functional activity Standing balance-Leahy Scale: Poor Standing balance comment: slight left lean                           ADL either performed or assessed with clinical judgement   ADL Overall ADL's : Needs assistance/impaired Eating/Feeding: Minimal assistance   Grooming: Moderate assistance;Sitting   Upper Body Bathing: Minimal assistance;Sitting  Lower Body Bathing: Maximal assistance   Upper Body Dressing : Moderate assistance   Lower Body Dressing: Maximal assistance   Toilet Transfer: Moderate  assistance;+2 for physical assistance (side steps toward [facility] with nursing staff and OT)           Functional mobility during ADLs: Moderate assistance;+2 for physical assistance General ADL Comments: Limited by L inattention, L sided weakness, balance, sensation     Vision Ability to See in Adequate Light: 0 Adequate Patient Visual Report: No change from baseline (Per grandson, had blurry vision/diplopia with last stroke, but pt denying changes in vision this current qadmission) Vision Assessment?: Yes;Vision impaired- to be further tested in functional context Eye Alignment: Impaired (comment) Tracking/Visual Pursuits: Impaired - to be further tested in functional context (Can cross midline to ~10 degrees) Visual Fields:  (decreased peripheral vision temporal region of L eye. Nasal portion of R eye WFL)     Perception Perception Perception Tested?: Yes Perception Deficits: Inattention/neglect (inattention) Inattention/Neglect: Does not attend to left side of body;Does not attend to left visual field   Praxis      Pertinent Vitals/Pain Pain Assessment Pain Assessment: No/denies pain     Hand Dominance Right   Extremity/Trunk Assessment Upper Extremity Assessment Upper Extremity Assessment: Generalized weakness;LUE deficits/detail LUE Deficits / Details: shoulder flexion 2+/5; no AROM at hand. PROM WFL. Pt with sensation at shoulder level, but unable to detect touch below elbow. LUE Sensation: decreased light touch LUE Coordination: decreased fine motor;decreased gross motor   Lower Extremity Assessment Lower Extremity Assessment: Defer to PT evaluation LLE Deficits / Details: knee extension 4/5 when attending to LE, ankle DF 4/5 LLE Sensation:  (pt denies numbness)   Cervical / Trunk Assessment Cervical / Trunk Assessment: Other exceptions Cervical / Trunk Exceptions: weak on left and leans to his left   Communication Communication Communication: Expressive  difficulties   Cognition Arousal/Alertness: Awake/alert Behavior During Therapy: Flat affect Overall Cognitive Status: No family/caregiver present to determine baseline cognitive functioning                                 General Comments: Oriented to self, location, ~situation (time NT); states "they told me I had a stroke" but cannot name any of his deficits. Consistently moving hand when asked to move L at bed level, but in seated position can follow command to attempt. Pt requiring verbal cues for sequencing and problem solving during mobility.     General Comments  RN present. HR max of 137 during session. RN aware    Exercises     Shoulder Instructions      Home Living Family/patient expects to be discharged to:: Unsure Living Arrangements: Alone   Type of Home: House Home Access: Stairs to enter [employer] of Steps: 1   Home Layout: One level     Bathroom Shower/Tub: Teacher, early years/pre: Standard     Home Equipment: None          Prior Functioning/Environment Prior Level of Function : Independent/Modified Independent                        OT Problem List: Decreased strength;Impaired balance (sitting and/or standing);Decreased activity tolerance;Impaired vision/perception;Decreased coordination;Decreased safety awareness;Decreased cognition;Decreased knowledge of use of DME or AE;Cardiopulmonary status limiting activity;Impaired UE functional use;Impaired sensation      OT Treatment/Interventions: Self-care/ADL training;Therapeutic exercise;DME and/or AE instruction;Therapeutic  activities;Cognitive remediation/compensation;Visual/perceptual remediation/compensation;Balance training;Patient/family education    OT Goals(Current goals can be found in the care plan section) Acute Rehab OT Goals OT Goal Formulation: With patient Time For Goal Achievement: 11/13/22 Potential to Achieve Goals: Good  OT Frequency:  Min 2X/week    Co-evaluation              AM-PAC OT "6 Clicks" Daily Activity     Outcome Measure Help from another person eating meals?: A Little Help from another person taking care of personal grooming?: A Lot Help from another person toileting, which includes using toliet, bedpan, or urinal?: A Lot Help from another person bathing (including washing, rinsing, drying)?: A Lot Help from another person to put on and taking off regular upper body clothing?: A Lot Help from another person to put on and taking off regular lower body clothing?: A Lot 6 Click Score: 13   End of Session Equipment Utilized During Treatment: Gait belt Nurse Communication: Mobility status  Activity Tolerance: Patient tolerated treatment well Patient left: in bed;with call bell/phone within reach;with bed alarm set;with family/visitor present  OT Visit Diagnosis: Unsteadiness on feet (R26.81);Muscle weakness (generalized) (M62.81);Other abnormalities of gait and mobility (R26.89);Low vision, both eyes (H54.2);Other symptoms and signs involving cognitive function;Cognitive communication deficit (R41.841);Hemiplegia and hemiparesis Symptoms and signs involving cognitive functions: Cerebral infarction Hemiplegia - Right/Left: Left Hemiplegia - dominant/non-dominant: Non-Dominant Hemiplegia - caused by: Cerebral infarction                Time: 3254-9826 OT Time Calculation (min): 22 min Charges:  OT General Charges $OT Visit: 1 Visit OT Evaluation $OT Eval Moderate Complexity: 1 Mod  Elder Cyphers, OTR/L [facility] Acute Rehabilitation Office: [phone]   Magnus Ivan 10/30/2022, 5:38 PM

## 2022-10-30 NOTE — Progress Notes (Signed)
[facility] PROGRESS NOTE   Glen Martinez:[id number] DOB: 1931/09/11 DOA: 11/04/2022  PCP: Tracie Harrier, MD  Brief History/Interval Summary:  89 y.o. male with medical history significant for PAF on Coumadin, history of CVA, insulin-dependent T2DM, CKD stage IIIa, HTN, HLD, hypothyroidism, BPH, OSA on CPAP who presented to the ED for evaluation of left-sided weakness, slurred speech, and left facial droop.  Apparently he was found on the floor next to his bed by his granddaughter.  Found to have acute stroke.  CK level was also elevated.  Patient was hospitalized for further management.  Consultants: Neurology  Procedures: Echocardiogram    Subjective/Interval History: Patient remains dysarthric.  No other complaints offered.  Trying to have his breakfast this morning.    Assessment/Plan:  Acute stroke Patient appears to have sustained a right MCA stroke.  Patient with left-sided weakness and dysarthria. Neurology is following. Was on warfarin prior to admission which is currently on hold.  Currently on aspirin.  Neurology plans to consider reinitiating Eliquis in 5 to 7 days poststroke. Lipid panel was done.  LDL could not be calculated.  HDL is 56.  Total cholesterol is 172.  Patient is on rosuvastatin. HbA1c 6.5 Echocardiogram shows EF to be 50 to 55%. Speech therapy is following.  Currently on dysphagia 1 diet. Waiting on physical therapy and Occupational Therapy evaluation.  Acute kidney injury on chronic kidney disease stage IIIa/rhabdomyolysis/hypokalemia Baseline creatinine around 1.4-1.5.  Presented with creatinine of 2.4.  This is in the setting of ACE inhibitor use which is currently on hold. CK level was noted to be elevated suggesting rhabdomyolysis.  He was found lying on the floor next to his bed.  Unclear how long he was on the floor. Renal function gradually improving.  Creatinine is down to 1.5. CK level has improved from greater than 11,000  to 4099. Monitor urine output.  Continue to monitor labs daily. Avoid nephrotoxic agents.  Paroxysmal atrial fibrillation with RVR Continue with metoprolol.  Monitor on telemetry. Was anticoagulated with warfarin.  Apparently he was on Eliquis previously but it was switched over due to cost issues.  INR was subtherapeutic at admission. Anticoagulation on hold for now.  Leukocytosis Presented with WBC of 22.6.  No clear infectious etiology identified.  WBC has been improving suggesting that this could have been reactive.  Continue to monitor.  He is afebrile.  UA was negative for UTI.  Chest x-ray negative for pneumonia.  Follow-up on blood cultures.  Continue to monitor off of antibiotics.  Essential hypertension Monitor blood pressures closely.  May need to use parenteral agents.  Diabetes mellitus type 2, with renal complications with CKD stage IIIa Monitor CBGs.  Continue SSI.  Abnormal LFTs Most likely due to rhabdomyolysis.  Levels are improving. Abdomen is benign. Can hold off on imaging studies if liver function returns to normal.  Obstructive sleep apnea CPAP  Hypothyroidism Continue levothyroxine  Hyperlipidemia Was on simvastatin prior to admission.  Currently on rosuvastatin.  BPH Was on Proscar prior to admission  Left Thigh Lesion Patient noted to have lump in the left thigh area.  Soft to touch.  Nontender.  No erythema noted.  Could be a lipoma.  Monitor for now.  Skin abrasions secondary to fall Stable.  He was given tetanus shot yesterday since vaccination status was unknown.  DVT Prophylaxis: Anticoagulation currently on hold.  SCDs. Code Status: Full code as of now Family Communication: Discussed with his granddaughter yesterday.  Will update family again  later today. Disposition Plan: To be determined  Status is: Inpatient Remains inpatient appropriate because: Acute stroke      Medications: Scheduled:  aspirin  325 mg Oral Daily   heparin  injection (subcutaneous)  5,000 Units Subcutaneous Q8H   insulin aspart  0-15 Units Subcutaneous Q4H   metoprolol tartrate  25 mg Oral BID   rosuvastatin  20 mg Oral Daily   Continuous:  0.9 % NaCl with KCl 20 mEq / L 100 mL/hr at 10/30/22 0656   PRN:[DISCONTINUED] acetaminophen **OR** [DISCONTINUED] acetaminophen (TYLENOL) oral liquid 160 mg/5 mL **OR** acetaminophen, metoprolol tartrate, senna-docusate  Antibiotics: Anti-infectives (From admission, onward)    None       Objective:  Vital Signs  Vitals:   10/30/22 0111 10/30/22 0426 10/30/22 0725 10/30/22 0814  BP:   (!) 130/90   Pulse: (!) 111 (!) 109 100 (!) 123  Resp: 20  19   Temp:  98.9 F (37.2 C) 98.3 F (36.8 C)   TempSrc:  Oral Axillary   SpO2:  98% 100%   Weight:      Height:       No intake or output data in the 24 hours ending 10/30/22 1016  Filed Weights   10/29/2022 1834  Weight: 72.6 kg    General appearance: Awake alert.  In no distress.  Dysarthric Resp: Clear to auscultation bilaterally.  Normal effort Cardio: S1-S2 is normal regular.  No S3-S4.  No rubs murmurs or bruit GI: Abdomen is soft.  Nontender nondistended.  Bowel sounds are present normal.  No masses organomegaly Extremities: No edema.   Neurologic: Left hemiparesis noted   Lab Results:  Data Reviewed: I have personally reviewed following labs and reports of the imaging studies  CBC: Recent Labs  Lab 10/16/2022 1840 10/12/2022 1919 10/29/22 0507 10/30/22 0402  WBC 22.6*  --  18.1* 14.8*  NEUTROABS 19.6*  --   --   --   HGB 15.6 16.7 14.5 14.2  HCT 47.4 49.0 42.3 42.8  MCV 93.9  --  91.6 94.9  PLT 199  --  164 142*     Basic Metabolic Panel: Recent Labs  Lab 10/24/2022 1919 11/08/2022 2000 10/29/22 0507 10/30/22 0402  NA 139 140 140 139  K 4.1 3.5 3.4* 4.8  CL 104 101 104 109  CO2  --  21* 22 18*  GLUCOSE 199* 196* 124* 103*  BUN 43* 29* 37* 33*  CREATININE 2.40* 2.29* 2.05* 1.50*  CALCIUM  --  8.8* 8.3* 8.1*      GFR: Estimated Creatinine Clearance: 30.1 mL/min (A) (by C-G formula based on SCr of 1.5 mg/dL (H)).  Liver Function Tests: Recent Labs  Lab 10/20/2022 2000 10/29/22 0507 10/30/22 0402  AST 223* 198* 134*  ALT 59* 58* 54*  ALKPHOS 92 83 75  BILITOT 1.5* 0.9 1.4*  PROT 7.4 6.5 6.1*  ALBUMIN 4.0 3.6 3.2*      Coagulation Profile: Recent Labs  Lab 11/09/2022 1840 10/29/22 0507  INR 1.2 1.2     Cardiac Enzymes: Recent Labs  Lab 10/13/2022 2000 10/30/22 0402  CKTOTAL 11,581* 4,099*      HbA1C: Recent Labs    10/29/22 0507  HGBA1C 6.5*     CBG: Recent Labs  Lab 10/29/22 1808 10/29/22 2110 10/30/22 0058 10/30/22 0428 10/30/22 0732  GLUCAP 227* 200* 82 133* 140*     Lipid Profile: Recent Labs    10/29/22 0507  CHOL 172  HDL 56  LDLCALC NOT CALCULATED  TRIG 102  CHOLHDL 3.1      Radiology Studies: ECHOCARDIOGRAM COMPLETE  Result Date: 10/29/2022    ECHOCARDIOGRAM REPORT   Patient Name:   Glen Martinez Klippel Date of Exam: 10/29/2022 Medical Rec #:  [id number]       Height:       66.0 in Accession #:    [id number]      Weight:       160.0 lb Date of Birth:  01/05/1931      BSA:          1.819 m Patient Age:    14 years        BP:           149/79 mmHg Patient Gender: M               HR:           118 bpm. Exam Location:  Inpatient Procedure: 2D Echo Indications:    stroke  History:        Patient has prior history of Echocardiogram examinations, most                 recent 07/10/2021. Stroke, Arrythmias:Atrial Fibrillation; Risk                 Factors:Hypertension, Diabetes and Dyslipidemia.  Sonographer:    Harvie Junior Referring Phys: [id number] [place]  1. In Afib with RVR during study. Left ventricular ejection fraction, by estimation, is 50 to 55%. The left ventricle has low normal function. The left ventricle has no regional wall motion abnormalities. There is mild left ventricular hypertrophy. Left  ventricular diastolic parameters  are indeterminate.  2. Right ventricular systolic function is normal. The right ventricular size is normal. There is normal pulmonary artery systolic pressure. The estimated right ventricular systolic pressure is 44.9 mmHg.  3. The mitral valve is normal in structure. Trivial mitral valve regurgitation.  4. The aortic valve is tricuspid. Aortic valve regurgitation is mild. Aortic valve sclerosis is present, with no evidence of aortic valve stenosis.  5. The inferior vena cava is normal in size with greater than 50% respiratory variability, suggesting right atrial pressure of 3 mmHg. FINDINGS  Left Ventricle: Left ventricular ejection fraction, by estimation, is 50 to 55%. The left ventricle has low normal function. The left ventricle has no regional wall motion abnormalities. The left ventricular internal cavity size was normal in size. There is mild left ventricular hypertrophy. Left ventricular diastolic parameters are indeterminate. Right Ventricle: The right ventricular size is normal. No increase in right ventricular wall thickness. Right ventricular systolic function is normal. There is normal pulmonary artery systolic pressure. The tricuspid regurgitant velocity is 2.33 m/s, and  with an assumed right atrial pressure of 3 mmHg, the estimated right ventricular systolic pressure is 67.5 mmHg. Left Atrium: Left atrial size was normal in size. Right Atrium: Right atrial size was normal in size. Pericardium: There is no evidence of pericardial effusion. Mitral Valve: The mitral valve is normal in structure. Trivial mitral valve regurgitation. Tricuspid Valve: The tricuspid valve is normal in structure. Tricuspid valve regurgitation is trivial. Aortic Valve: The aortic valve is tricuspid. Aortic valve regurgitation is mild. Aortic regurgitation PHT measures 579 msec. Aortic valve sclerosis is present, with no evidence of aortic valve stenosis. Aortic valve mean gradient measures 1.2 mmHg. Aortic valve peak gradient  measures 2.1 mmHg. Aortic valve area, by VTI measures 2.83 cm. Pulmonic Valve: The pulmonic valve was not well visualized.  Pulmonic valve regurgitation is trivial. Aorta: The aortic root and ascending aorta are structurally normal, with no evidence of dilitation. Venous: The inferior vena cava is normal in size with greater than 50% respiratory variability, suggesting right atrial pressure of 3 mmHg. IAS/Shunts: The interatrial septum was not well visualized.  LEFT VENTRICLE PLAX 2D LVIDd:         4.30 cm     Diastology LVIDs:         3.20 cm     LV e' medial:    6.05 cm/s LV PW:         1.20 cm     LV E/e' medial:  12.9 LV IVS:        1.20 cm     LV e' lateral:   8.50 cm/s LVOT diam:     2.00 cm     LV E/e' lateral: 9.2 LV SV:         34 LV SV Index:   19 LVOT Area:     3.14 cm  LV Volumes (MOD) LV vol d, MOD A2C: 49.0 ml LV vol d, MOD A4C: 57.5 ml LV vol s, MOD A2C: 23.2 ml LV vol s, MOD A4C: 28.6 ml LV SV MOD A2C:     25.8 ml LV SV MOD A4C:     57.5 ml LV SV MOD BP:      29.0 ml RIGHT VENTRICLE RV Basal diam:  2.90 cm RV Mid diam:    2.40 cm RV S prime:     11.30 cm/s TAPSE (M-mode): 1.5 cm LEFT ATRIUM             Index        RIGHT ATRIUM           Index LA diam:        4.10 cm 2.25 cm/m   RA Area:     11.60 cm LA Vol (A2C):   35.7 ml 19.63 ml/m  RA Volume:   25.00 ml  13.74 ml/m LA Vol (A4C):   56.3 ml 30.95 ml/m LA Biplane Vol: 45.9 ml 25.24 ml/m  AORTIC VALVE                    PULMONIC VALVE AV Area (Vmax):    2.77 cm     PV Vmax:          0.72 m/s AV Area (Vmean):   2.74 cm     PV Peak grad:     2.1 mmHg AV Area (VTI):     2.83 cm     PR End Diast Vel: 5.57 msec AV Vmax:           72.40 cm/s AV Vmean:          52.450 cm/s AV VTI:            0.119 m AV Peak Grad:      2.1 mmHg AV Mean Grad:      1.2 mmHg LVOT Vmax:         63.92 cm/s LVOT Vmean:        45.680 cm/s LVOT VTI:          0.107 m LVOT/AV VTI ratio: 0.90 AI PHT:            579 msec  AORTA Ao Root diam: 3.60 cm Ao Asc diam:  3.20 cm  MITRAL VALVE               TRICUSPID VALVE  MV Area (PHT): 3.39 cm    TR Peak grad:   21.7 mmHg MV Decel Time: 224 msec    TR Vmax:        233.00 cm/s MR Peak grad: 28.3 mmHg MR Vmax:      266.00 cm/s  SHUNTS MV E velocity: 78.00 cm/s  Systemic VTI:  0.11 m MV A velocity: 20.10 cm/s  Systemic Diam: 2.00 cm MV E/A ratio:  3.88 Oswaldo Milian MD Electronically signed by Oswaldo Milian MD Signature Date/Time: 10/29/2022/3:45:51 PM    Final    VAS US CAROTID  Result Date: 10/29/2022 Carotid Arterial Duplex Study Patient Name:  DHANI DANNEMILLER Rodenbaugh  Date of Exam:   10/29/2022 Medical Rec #: [id number]        Accession #:    [id number] Date of Birth: 08/24/1931       Patient Gender: M Patient Age:   36 years Exam Location:  [facility] Procedure:      VAS US CAROTID Referring Phys: [facility] --------------------------------------------------------------------------------  Indications:       CVA. Risk Factors:      Hypertension, hyperlipidemia, Diabetes. Comparison Study:  No prior studies. Performing Technologist: Oliver Hum RVT  Examination Guidelines: A complete evaluation includes B-mode imaging, spectral Doppler, color Doppler, and power Doppler as needed of all accessible portions of each vessel. Bilateral testing is considered an integral part of a complete examination. Limited examinations for reoccurring indications may be performed as noted.  Right Carotid Findings: +----------+--------+--------+--------+-----------------------+--------+           PSV cm/sEDV cm/sStenosisPlaque Description     Comments +----------+--------+--------+--------+-----------------------+--------+ CCA Prox  83      14              smooth and heterogenous         +----------+--------+--------+--------+-----------------------+--------+ CCA Distal65      17              smooth and heterogenous         +----------+--------+--------+--------+-----------------------+--------+ ICA Prox  25       7               heterogenous and smooth         +----------+--------+--------+--------+-----------------------+--------+ ICA Distal42      12                                     tortuous +----------+--------+--------+--------+-----------------------+--------+ ECA       92      1                                               +----------+--------+--------+--------+-----------------------+--------+ +----------+--------+-------+--------+-------------------+           PSV cm/sEDV cmsDescribeArm Pressure (mmHG) +----------+--------+-------+--------+-------------------+ Subclavian89                                         +----------+--------+-------+--------+-------------------+ +---------+--------+--+--------+-+---------+ VertebralPSV cm/s24EDV cm/s5Antegrade +---------+--------+--+--------+-+---------+  Left Carotid Findings: +----------+--------+--------+--------+-----------------------+--------+           PSV cm/sEDV cm/sStenosisPlaque Description     Comments +----------+--------+--------+--------+-----------------------+--------+ CCA Prox  94      14  smooth and heterogenous         +----------+--------+--------+--------+-----------------------+--------+ CCA Distal72      11              smooth and heterogenous         +----------+--------+--------+--------+-----------------------+--------+ ICA Prox  33      12              smooth and heterogenous         +----------+--------+--------+--------+-----------------------+--------+ ICA Distal37      18                                     tortuous +----------+--------+--------+--------+-----------------------+--------+ ECA       61      7                                               +----------+--------+--------+--------+-----------------------+--------+ +----------+--------+--------+--------+-------------------+           PSV cm/sEDV cm/sDescribeArm Pressure (mmHG)  +----------+--------+--------+--------+-------------------+ Subclavian110                                         +----------+--------+--------+--------+-------------------+ +---------+--------+--+--------+--+---------+ VertebralPSV cm/s25EDV cm/s10Antegrade +---------+--------+--+--------+--+---------+   Summary: Right Carotid: Velocities in the right ICA are consistent with a 1-39% stenosis. Left Carotid: Velocities in the left ICA are consistent with a 1-39% stenosis. Vertebrals: Bilateral vertebral arteries demonstrate antegrade flow. *See table(s) above for measurements and observations.     Preliminary    MR ANGIO HEAD WO CONTRAST  Result Date: 10/31/2022 CLINICAL DATA:  Acute neurologic deficit EXAM: MRA HEAD WITHOUT CONTRAST TECHNIQUE: Angiographic images of the Circle of Willis were acquired using MRA technique without intravenous contrast. COMPARISON:  None Available. FINDINGS: POSTERIOR CIRCULATION: --Vertebral arteries: Normal --Inferior cerebellar arteries: Normal. --Basilar artery: Normal. --Superior cerebellar arteries: Normal. --Posterior cerebral arteries: Normal. ANTERIOR CIRCULATION: --Intracranial internal carotid arteries: Normal. --Anterior cerebral arteries (ACA): Normal. --Middle cerebral arteries (MCA): Normal. IMPRESSION: Normal intracranial MRA. Electronically Signed   By: Ulyses Jarred M.D.   On: 11/02/2022 23:59   CT Head Wo Contrast  Result Date: 10/24/2022 CLINICAL DATA:  Neurologic deficit.  Stroke suspected. EXAM: CT HEAD WITHOUT CONTRAST TECHNIQUE: Contiguous axial images were obtained from the base of the skull through the vertex without intravenous contrast. RADIATION DOSE REDUCTION: This exam was performed according to the departmental dose-optimization program which includes automated exposure control, adjustment of the mA and/or kV according to patient size and/or use of iterative reconstruction technique. COMPARISON:  Head CT dated 07/10/2021. FINDINGS:  Evaluation of this exam is limited due to motion artifact. Brain: An area of hypodensity in the right posterior temporal and parietal lobes consistent with edema and infarct. There is minimal associated mass effect on the adjacent brain parenchyma. No midline shift. Areas of slight higher attenuation within the infarct suspicious for petechial hemorrhage. Evaluation is limited due to motion. There is background of mild age-related atrophy and chronic microvascular ischemic changes. Vascular: Suboptimally evaluated due to motion. Skull: Normal. Negative for fracture or focal lesion. Sinuses/Orbits: Partial opacification of a left ethmoid air cells. The remainder of the visualized paranasal sinuses and mastoid air cells are clear. Other: None IMPRESSION: 1. Right MCA territory infarct with findings  suspicious for small petechial hemorrhage. No midline shift. 2. Mild age-related atrophy and chronic microvascular ischemic changes. These results were called by telephone at the time of interpretation on 11/07/2022 at 9:32 pm to Dr Alvino Chapel, who verbally acknowledged these results. Electronically Signed   By: Anner Crete M.D.   On: 11/05/2022 21:34   MR BRAIN WO CONTRAST  Result Date: 10/12/2022 CLINICAL DATA:  Acute neurologic deficit EXAM: MRI HEAD WITHOUT CONTRAST TECHNIQUE: Multiplanar, multiecho pulse sequences of the brain and surrounding structures were obtained without intravenous contrast. COMPARISON:  07/09/2021 FINDINGS: Brain: There is a large area of acute ischemia within the posterior right MCA territory. There is petechial hemorrhage within the infarcted territory. 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 There is multifocal hyperintense T2-weighted signal within the periventricular and deep white matter. Old right PCA territory infarct. Normal midline structures. Vascular: Normal flow voids. Skull and upper cervical spine: Normal marrow signal. Sinuses/Orbits: Paranasal  sinuses are clear. No mastoid effusion. Normal orbits. Bilateral ocular lens replacements. Other: Negative IMPRESSION: 1. Large area of acute ischemia within the posterior right MCA territory. Petechial hemorrhage within the infarcted territory. No mass effect or midline shift. Heidelberg classification 1a: HI1, scattered small petechiae, no mass effect. 2. Old right PCA territory infarct. Electronically Signed   By: Ulyses Jarred M.D.   On: 11/09/2022 21:25   CT Cervical Spine Wo Contrast  Result Date: 10/13/2022 CLINICAL DATA:  Neck trauma EXAM: CT CERVICAL SPINE WITHOUT CONTRAST TECHNIQUE: Multidetector CT imaging of the cervical spine was performed without intravenous contrast. Multiplanar CT image reconstructions were also generated. RADIATION DOSE REDUCTION: This exam was performed according to the departmental dose-optimization program which includes automated exposure control, adjustment of the mA and/or kV according to patient size and/or use of iterative reconstruction technique. COMPARISON:  None Available. FINDINGS: Alignment: Normal. Skull base and vertebrae: No acute fracture identified. No focal osseous lesion. Soft tissues and spinal canal: No prevertebral fluid or swelling. No visible canal hematoma. Disc levels: Large anterior osteophytes are seen at C4, C5, C6 and C7. Disc spaces are preserved. No severe central canal or neural foraminal stenosis identified at any level. Upper chest: Negative. Other: None. IMPRESSION: 1. No acute fracture or subluxation of the cervical spine. 2. Degenerative changes of the cervical spine. Electronically Signed   By: Ronney Asters M.D.   On: 10/18/2022 20:51   DG Chest Port 1 View  Result Date: 11/08/2022 CLINICAL DATA:  Pneumonia and cough EXAM: PORTABLE CHEST 1 VIEW COMPARISON:  12/12/2019 FINDINGS: Loop recorder is present. Slightly shallow inspiration. Heart size and pulmonary vascularity are normal. Lungs are clear. No pleural effusions. No  pneumothorax. Mediastinal contours appear intact. IMPRESSION: No active disease. Electronically Signed   By: Lucienne Capers M.D.   On: 10/29/2022 19:46   DG Knee 2 Views Left  Result Date: 10/27/2022 CLINICAL DATA:  Fall EXAM: LEFT KNEE - 1-2 VIEW COMPARISON:  None Available. FINDINGS: There is no acute fracture or dislocation. Joint spaces are well maintained. There is no joint effusion. Peripheral vascular calcifications are present. There is bubbly cortical lucency in the proximal femoral diaphysis measuring 2.4 x 0.5 cm. IMPRESSION: 1. No acute fracture or dislocation. 2. Bubbly cortical lucency in the proximal femoral diaphysis measuring 2.4 x 0.5 cm. This is nonspecific and may represent a nonossifying fibroma. Electronically Signed   By: Ronney Asters M.D.   On: 10/23/2022 19:10       LOS: 2 days   Bonnielee Haff  [facility] operational officer on [email or web address]  10/30/2022, 10:16 AM

## 2022-10-30 NOTE — Progress Notes (Signed)
STROKE TEAM PROGRESS NOTE   SUBJECTIVE (INTERVAL HISTORY) No family is at the bedside.  Patient lying in bed, initially sleeping, however easily arousable.  Still has severe dysarthria and sometimes intangible words.  Left upper extremity seems weaker than yesterday not able to against gravity, however per RN patient has been working with PT/OT and sitting in chair, likely to be exhausted.  RN has checked patient throughout the day and left upper extremity able to against gravity.  Left lower extremity is still 3+/5.   OBJECTIVE Temp:  [98.3 F (36.8 C)-99 F (37.2 C)] 98.4 F (36.9 C) (01/21 1604) Pulse Rate:  [94-123] 108 (01/21 1604) Cardiac Rhythm: Atrial fibrillation (01/21 1521) Resp:  [17-20] 19 (01/21 1604) BP: (130-159)/(80-104) 135/82 (01/21 1604) SpO2:  [96 %-100 %] 96 % (01/21 1604) FiO2 (%):  [21 %] 21 % (01/21 0111)  Recent Labs  Lab 10/30/22 0058 10/30/22 0428 10/30/22 0732 10/30/22 1129 10/30/22 1605  GLUCAP 82 133* 140* 133* 179*   Recent Labs  Lab 10/13/2022 1919 10/11/2022 2000 10/29/22 0507 10/30/22 0402  NA 139 140 140 139  K 4.1 3.5 3.4* 4.8  CL 104 101 104 109  CO2  --  21* 22 18*  GLUCOSE 199* 196* 124* 103*  BUN 43* 29* 37* 33*  CREATININE 2.40* 2.29* 2.05* 1.50*  CALCIUM  --  8.8* 8.3* 8.1*   Recent Labs  Lab 10/24/2022 2000 10/29/22 0507 10/30/22 0402  AST 223* 198* 134*  ALT 59* 58* 54*  ALKPHOS 92 83 75  BILITOT 1.5* 0.9 1.4*  PROT 7.4 6.5 6.1*  ALBUMIN 4.0 3.6 3.2*   Recent Labs  Lab 10/23/2022 1840 10/26/2022 1919 10/29/22 0507 10/30/22 0402  WBC 22.6*  --  18.1* 14.8*  NEUTROABS 19.6*  --   --   --   HGB 15.6 16.7 14.5 14.2  HCT 47.4 49.0 42.3 42.8  MCV 93.9  --  91.6 94.9  PLT 199  --  164 142*   Recent Labs  Lab 11/06/2022 2000 10/30/22 0402  CKTOTAL 11,581* 4,099*   Recent Labs    10/13/2022 1840 10/29/22 0507  LABPROT 14.9 15.0  INR 1.2 1.2   Recent Labs    10/20/2022 2014  COLORURINE YELLOW  LABSPEC 1.017   PHURINE 5.0  GLUCOSEU NEGATIVE  HGBUR LARGE*  BILIRUBINUR NEGATIVE  KETONESUR 5*  PROTEINUR 100*  NITRITE NEGATIVE  LEUKOCYTESUR NEGATIVE       Component Value Date/Time   CHOL 172 10/29/2022 0507   TRIG 102 10/29/2022 0507   HDL 56 10/29/2022 0507   CHOLHDL 3.1 10/29/2022 0507   VLDL 20 10/29/2022 0507   LDLCALC NOT CALCULATED 10/29/2022 0507   Lab Results  Component Value Date   HGBA1C 6.5 (H) 10/29/2022      Component Value Date/Time   LABOPIA NONE DETECTED 10/15/2022 2014   COCAINSCRNUR NONE DETECTED 10/19/2022 2014   LABBENZ NONE DETECTED 10/10/2022 2014   AMPHETMU NONE DETECTED 11/05/2022 2014   THCU NONE DETECTED 11/04/2022 2014   LABBARB NONE DETECTED 11/02/2022 2014    Recent Labs  Lab 10/20/2022 1840  ETH <10    I have personally reviewed the radiological images below and agree with the radiology interpretations.  ECHOCARDIOGRAM COMPLETE  Result Date: 10/29/2022    ECHOCARDIOGRAM REPORT   Patient Name:   PHELIX FUDALA Bonanno Date of Exam: 10/29/2022 Medical Rec #:  [id number]       Height:       66.0 in Accession #:  [id number]      Weight:       160.0 lb Date of Birth:  28-Jul-1931      BSA:          1.819 m Patient Age:    89 years        BP:           149/79 mmHg Patient Gender: M               HR:           118 bpm. Exam Location:  Inpatient Procedure: 2D Echo Indications:    stroke  History:        Patient has prior history of Echocardiogram examinations, most                 recent 07/10/2021. Stroke, Arrythmias:Atrial Fibrillation; Risk                 Factors:Hypertension, Diabetes and Dyslipidemia.  Sonographer:    Harvie Junior Referring Phys: [id number] [place]  1. In Afib with RVR during study. Left ventricular ejection fraction, by estimation, is 50 to 55%. The left ventricle has low normal function. The left ventricle has no regional wall motion abnormalities. There is mild left ventricular hypertrophy. Left  ventricular diastolic parameters  are indeterminate.  2. Right ventricular systolic function is normal. The right ventricular size is normal. There is normal pulmonary artery systolic pressure. The estimated right ventricular systolic pressure is 78.6 mmHg.  3. The mitral valve is normal in structure. Trivial mitral valve regurgitation.  4. The aortic valve is tricuspid. Aortic valve regurgitation is mild. Aortic valve sclerosis is present, with no evidence of aortic valve stenosis.  5. The inferior vena cava is normal in size with greater than 50% respiratory variability, suggesting right atrial pressure of 3 mmHg. FINDINGS  Left Ventricle: Left ventricular ejection fraction, by estimation, is 50 to 55%. The left ventricle has low normal function. The left ventricle has no regional wall motion abnormalities. The left ventricular internal cavity size was normal in size. There is mild left ventricular hypertrophy. Left ventricular diastolic parameters are indeterminate. Right Ventricle: The right ventricular size is normal. No increase in right ventricular wall thickness. Right ventricular systolic function is normal. There is normal pulmonary artery systolic pressure. The tricuspid regurgitant velocity is 2.33 m/s, and  with an assumed right atrial pressure of 3 mmHg, the estimated right ventricular systolic pressure is 76.7 mmHg. Left Atrium: Left atrial size was normal in size. Right Atrium: Right atrial size was normal in size. Pericardium: There is no evidence of pericardial effusion. Mitral Valve: The mitral valve is normal in structure. Trivial mitral valve regurgitation. Tricuspid Valve: The tricuspid valve is normal in structure. Tricuspid valve regurgitation is trivial. Aortic Valve: The aortic valve is tricuspid. Aortic valve regurgitation is mild. Aortic regurgitation PHT measures 579 msec. Aortic valve sclerosis is present, with no evidence of aortic valve stenosis. Aortic valve mean gradient measures 1.2 mmHg. Aortic valve peak gradient  measures 2.1 mmHg. Aortic valve area, by VTI measures 2.83 cm. Pulmonic Valve: The pulmonic valve was not well visualized. Pulmonic valve regurgitation is trivial. Aorta: The aortic root and ascending aorta are structurally normal, with no evidence of dilitation. Venous: The inferior vena cava is normal in size with greater than 50% respiratory variability, suggesting right atrial pressure of 3 mmHg. IAS/Shunts: The interatrial septum was not well visualized.  LEFT VENTRICLE PLAX 2D LVIDd:  4.30 cm     Diastology LVIDs:         3.20 cm     LV e' medial:    6.05 cm/s LV PW:         1.20 cm     LV E/e' medial:  12.9 LV IVS:        1.20 cm     LV e' lateral:   8.50 cm/s LVOT diam:     2.00 cm     LV E/e' lateral: 9.2 LV SV:         34 LV SV Index:   19 LVOT Area:     3.14 cm  LV Volumes (MOD) LV vol d, MOD A2C: 49.0 ml LV vol d, MOD A4C: 57.5 ml LV vol s, MOD A2C: 23.2 ml LV vol s, MOD A4C: 28.6 ml LV SV MOD A2C:     25.8 ml LV SV MOD A4C:     57.5 ml LV SV MOD BP:      29.0 ml RIGHT VENTRICLE RV Basal diam:  2.90 cm RV Mid diam:    2.40 cm RV S prime:     11.30 cm/s TAPSE (M-mode): 1.5 cm LEFT ATRIUM             Index        RIGHT ATRIUM           Index LA diam:        4.10 cm 2.25 cm/m   RA Area:     11.60 cm LA Vol (A2C):   35.7 ml 19.63 ml/m  RA Volume:   25.00 ml  13.74 ml/m LA Vol (A4C):   56.3 ml 30.95 ml/m LA Biplane Vol: 45.9 ml 25.24 ml/m  AORTIC VALVE                    PULMONIC VALVE AV Area (Vmax):    2.77 cm     PV Vmax:          0.72 m/s AV Area (Vmean):   2.74 cm     PV Peak grad:     2.1 mmHg AV Area (VTI):     2.83 cm     PR End Diast Vel: 5.57 msec AV Vmax:           72.40 cm/s AV Vmean:          52.450 cm/s AV VTI:            0.119 m AV Peak Grad:      2.1 mmHg AV Mean Grad:      1.2 mmHg LVOT Vmax:         63.92 cm/s LVOT Vmean:        45.680 cm/s LVOT VTI:          0.107 m LVOT/AV VTI ratio: 0.90 AI PHT:            579 msec  AORTA Ao Root diam: 3.60 cm Ao Asc diam:  3.20 cm  MITRAL VALVE               TRICUSPID VALVE MV Area (PHT): 3.39 cm    TR Peak grad:   21.7 mmHg MV Decel Time: 224 msec    TR Vmax:        233.00 cm/s MR Peak grad: 28.3 mmHg MR Vmax:      266.00 cm/s  SHUNTS MV E velocity: 78.00 cm/s  Systemic VTI:  0.11 m MV A velocity:  20.10 cm/s  Systemic Diam: 2.00 cm MV E/A ratio:  3.88 Oswaldo Milian MD Electronically signed by Oswaldo Milian MD Signature Date/Time: 10/29/2022/3:45:51 PM    Final    VAS US CAROTID  Result Date: 10/29/2022 Carotid Arterial Duplex Study Patient Name:  KHOA OPDAHL Farrier  Date of Exam:   10/29/2022 Medical Rec #: [id number]        Accession #:    [id number] Date of Birth: 06/05/1931       Patient Gender: M Patient Age:   37 years Exam Location:  [facility] Procedure:      VAS US CAROTID Referring Phys: Alferd Patee [facility] --------------------------------------------------------------------------------  Indications:       CVA. Risk Factors:      Hypertension, hyperlipidemia, Diabetes. Comparison Study:  No prior studies. Performing Technologist: Oliver Hum RVT  Examination Guidelines: A complete evaluation includes B-mode imaging, spectral Doppler, color Doppler, and power Doppler as needed of all accessible portions of each vessel. Bilateral testing is considered an integral part of a complete examination. Limited examinations for reoccurring indications may be performed as noted.  Right Carotid Findings: +----------+--------+--------+--------+-----------------------+--------+           PSV cm/sEDV cm/sStenosisPlaque Description     Comments +----------+--------+--------+--------+-----------------------+--------+ CCA Prox  83      14              smooth and heterogenous         +----------+--------+--------+--------+-----------------------+--------+ CCA Distal65      17              smooth and heterogenous         +----------+--------+--------+--------+-----------------------+--------+ ICA Prox  25       7               heterogenous and smooth         +----------+--------+--------+--------+-----------------------+--------+ ICA Distal42      12                                     tortuous +----------+--------+--------+--------+-----------------------+--------+ ECA       92      1                                               +----------+--------+--------+--------+-----------------------+--------+ +----------+--------+-------+--------+-------------------+           PSV cm/sEDV cmsDescribeArm Pressure (mmHG) +----------+--------+-------+--------+-------------------+ Subclavian89                                         +----------+--------+-------+--------+-------------------+ +---------+--------+--+--------+-+---------+ VertebralPSV cm/s24EDV cm/s5Antegrade +---------+--------+--+--------+-+---------+  Left Carotid Findings: +----------+--------+--------+--------+-----------------------+--------+           PSV cm/sEDV cm/sStenosisPlaque Description     Comments +----------+--------+--------+--------+-----------------------+--------+ CCA Prox  94      14              smooth and heterogenous         +----------+--------+--------+--------+-----------------------+--------+ CCA Distal72      11              smooth and heterogenous         +----------+--------+--------+--------+-----------------------+--------+ ICA Prox  33      12  smooth and heterogenous         +----------+--------+--------+--------+-----------------------+--------+ ICA Distal37      18                                     tortuous +----------+--------+--------+--------+-----------------------+--------+ ECA       61      7                                               +----------+--------+--------+--------+-----------------------+--------+ +----------+--------+--------+--------+-------------------+           PSV cm/sEDV cm/sDescribeArm Pressure (mmHG)  +----------+--------+--------+--------+-------------------+ Subclavian110                                         +----------+--------+--------+--------+-------------------+ +---------+--------+--+--------+--+---------+ VertebralPSV cm/s25EDV cm/s10Antegrade +---------+--------+--+--------+--+---------+   Summary: Right Carotid: Velocities in the right ICA are consistent with a 1-39% stenosis. Left Carotid: Velocities in the left ICA are consistent with a 1-39% stenosis. Vertebrals: Bilateral vertebral arteries demonstrate antegrade flow. *See table(s) above for measurements and observations.     Preliminary    MR ANGIO HEAD WO CONTRAST  Result Date: 11/08/2022 CLINICAL DATA:  Acute neurologic deficit EXAM: MRA HEAD WITHOUT CONTRAST TECHNIQUE: Angiographic images of the Circle of Willis were acquired using MRA technique without intravenous contrast. COMPARISON:  None Available. FINDINGS: POSTERIOR CIRCULATION: --Vertebral arteries: Normal --Inferior cerebellar arteries: Normal. --Basilar artery: Normal. --Superior cerebellar arteries: Normal. --Posterior cerebral arteries: Normal. ANTERIOR CIRCULATION: --Intracranial internal carotid arteries: Normal. --Anterior cerebral arteries (ACA): Normal. --Middle cerebral arteries (MCA): Normal. IMPRESSION: Normal intracranial MRA. Electronically Signed   By: Ulyses Jarred M.D.   On: 10/13/2022 23:59   CT Head Wo Contrast  Result Date: 10/18/2022 CLINICAL DATA:  Neurologic deficit.  Stroke suspected. EXAM: CT HEAD WITHOUT CONTRAST TECHNIQUE: Contiguous axial images were obtained from the base of the skull through the vertex without intravenous contrast. RADIATION DOSE REDUCTION: This exam was performed according to the departmental dose-optimization program which includes automated exposure control, adjustment of the mA and/or kV according to patient size and/or use of iterative reconstruction technique. COMPARISON:  Head CT dated 07/10/2021. FINDINGS:  Evaluation of this exam is limited due to motion artifact. Brain: An area of hypodensity in the right posterior temporal and parietal lobes consistent with edema and infarct. There is minimal associated mass effect on the adjacent brain parenchyma. No midline shift. Areas of slight higher attenuation within the infarct suspicious for petechial hemorrhage. Evaluation is limited due to motion. There is background of mild age-related atrophy and chronic microvascular ischemic changes. Vascular: Suboptimally evaluated due to motion. Skull: Normal. Negative for fracture or focal lesion. Sinuses/Orbits: Partial opacification of a left ethmoid air cells. The remainder of the visualized paranasal sinuses and mastoid air cells are clear. Other: None IMPRESSION: 1. Right MCA territory infarct with findings suspicious for small petechial hemorrhage. No midline shift. 2. Mild age-related atrophy and chronic microvascular ischemic changes. These results were called by telephone at the time of interpretation on 10/13/2022 at 9:32 pm to Dr Alvino Chapel, who verbally acknowledged these results. Electronically Signed   By: Anner Crete M.D.   On: 10/30/2022 21:34   MR BRAIN WO CONTRAST  Result Date: 11/04/2022 CLINICAL DATA:  Acute neurologic deficit EXAM: MRI HEAD WITHOUT CONTRAST TECHNIQUE: Multiplanar, multiecho pulse sequences of the brain and surrounding structures were obtained without intravenous contrast. COMPARISON:  07/09/2021 FINDINGS: Brain: There is a large area of acute ischemia within the posterior right MCA territory. There is petechial hemorrhage within the infarcted territory. 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 There is multifocal hyperintense T2-weighted signal within the periventricular and deep white matter. Old right PCA territory infarct. Normal midline structures. Vascular: Normal flow voids. Skull and upper cervical spine: Normal marrow signal. Sinuses/Orbits: Paranasal  sinuses are clear. No mastoid effusion. Normal orbits. Bilateral ocular lens replacements. Other: Negative IMPRESSION: 1. Large area of acute ischemia within the posterior right MCA territory. Petechial hemorrhage within the infarcted territory. No mass effect or midline shift. Heidelberg classification 1a: HI1, scattered small petechiae, no mass effect. 2. Old right PCA territory infarct. Electronically Signed   By: Ulyses Jarred M.D.   On: 10/21/2022 21:25   CT Cervical Spine Wo Contrast  Result Date: 11/09/2022 CLINICAL DATA:  Neck trauma EXAM: CT CERVICAL SPINE WITHOUT CONTRAST TECHNIQUE: Multidetector CT imaging of the cervical spine was performed without intravenous contrast. Multiplanar CT image reconstructions were also generated. RADIATION DOSE REDUCTION: This exam was performed according to the departmental dose-optimization program which includes automated exposure control, adjustment of the mA and/or kV according to patient size and/or use of iterative reconstruction technique. COMPARISON:  None Available. FINDINGS: Alignment: Normal. Skull base and vertebrae: No acute fracture identified. No focal osseous lesion. Soft tissues and spinal canal: No prevertebral fluid or swelling. No visible canal hematoma. Disc levels: Large anterior osteophytes are seen at C4, C5, C6 and C7. Disc spaces are preserved. No severe central canal or neural foraminal stenosis identified at any level. Upper chest: Negative. Other: None. IMPRESSION: 1. No acute fracture or subluxation of the cervical spine. 2. Degenerative changes of the cervical spine. Electronically Signed   By: Ronney Asters M.D.   On: 11/03/2022 20:51   DG Chest Port 1 View  Result Date: 10/12/2022 CLINICAL DATA:  Pneumonia and cough EXAM: PORTABLE CHEST 1 VIEW COMPARISON:  12/12/2019 FINDINGS: Loop recorder is present. Slightly shallow inspiration. Heart size and pulmonary vascularity are normal. Lungs are clear. No pleural effusions. No  pneumothorax. Mediastinal contours appear intact. IMPRESSION: No active disease. Electronically Signed   By: Lucienne Capers M.D.   On: 10/27/2022 19:46   DG Knee 2 Views Left  Result Date: 11/02/2022 CLINICAL DATA:  Fall EXAM: LEFT KNEE - 1-2 VIEW COMPARISON:  None Available. FINDINGS: There is no acute fracture or dislocation. Joint spaces are well maintained. There is no joint effusion. Peripheral vascular calcifications are present. There is bubbly cortical lucency in the proximal femoral diaphysis measuring 2.4 x 0.5 cm. IMPRESSION: 1. No acute fracture or dislocation. 2. Bubbly cortical lucency in the proximal femoral diaphysis measuring 2.4 x 0.5 cm. This is nonspecific and may represent a nonossifying fibroma. Electronically Signed   By: Ronney Asters M.D.   On: 10/23/2022 19:10   CUP PACEART REMOTE DEVICE CHECK  Result Date: 10/05/2022 ILR summary report received. Battery status OK. Normal device function. No new symptom, tachy, brady, or pause episodes. Persistent AF/AFL, controlled rates, burden 86.6%, Eliquis Monthly summary reports and ROV/PRN LA    PHYSICAL EXAM  Temp:  [98.3 F (36.8 C)-99 F (37.2 C)] 98.4 F (36.9 C) (01/21 1604) Pulse Rate:  [94-123] 108 (01/21 1604) Resp:  [17-20] 19 (01/21 1604) BP: (130-159)/(80-104) 135/82 (01/21 1604)  SpO2:  [96 %-100 %] 96 % (01/21 1604) FiO2 (%):  [21 %] 21 % (01/21 0111)  General - Well nourished, well developed, in no apparent distress.  Ophthalmologic - fundi not visualized due to noncooperation.  Cardiovascular - irregularly irregular heart rate and rhythm.  Neuro - initially drowsy, but easily arousable with voice, eyes open, orientated to age, place, and time. No aphasia but paucity of speech with this severe dysarthria and sometimes intangible words, following all simple commands. No gaze palsy but right gaze preference, left hemianopia.  Left facial droop. Tongue midline.  Right upper and lower extremity at least 4/5,  left upper extremity 2/5, left lower extremity 3+/5. Sensation symmetrical bilaterally subjectively, right FTN intact, gait not tested.     ASSESSMENT/PLAN Mr. Glen Martinez is a 89 y.o. male with history of hypertension, hyperlipidemia, diabetes, A-fib on Coumadin, stroke admitted for left-sided weakness, left facial droop, slurred speech and fell at home. No tPA given due to outside window.    Stroke:  right MCA large infarct with petechial hemorrhage, embolic pattern, secondary to A-fib on Coumadin with subtherapeutic INR CT right MCA large infarct with petechial hemorrhage, old right PCA infarct MRI right MCA large infarct with HI1 hemorrhagic transformation, questionable right cerebellar punctate infarct, old right PCA infarct. MRA unremarkable Carotid Doppler unremarkable 2D Echo EF 50 to 55% LDL 96 HgbA1c 6.5 UDS negative Heparin subcu for VTE prophylaxis warfarin daily prior to admission, now on aspirin 325 mg daily.  Given the size of the stroke and HT, will consider eliquis in 3-5 days.  Patient counseled to be compliant with his antithrombotic medications Ongoing aggressive stroke risk factor management Therapy recommendations: SNF Disposition: Pending  History of stroke 07/2021 admitted for dizziness and fall.  MRI showed large right PCA infarct with HI 2 hemorrhagic transformation as well as small left PCA infarct.  CTA head and neck right P3 occlusion.  EF 65 to 70%.  No DVT.  LDL 42, A1c 7.2.  Loop recorder placed.  Discharged on aspirin 81 and Zocor 40  A-fib with RVR A-fib was found after loop placement Started on Eliquis first Eliquis later changed to Coumadin due to cost INR 1.2 on admission Currently on aspirin 325 On metoprolol Will consider Eliquis in 3-5 days Need social work consult for financial assistance  Diabetes HgbA1c 6.5 goal < 7.0 Controlled CBG monitoring SSI DM education and close PCP follow up  Hypertension Stable BP goal <160 Long  term BP goal normotensive  Hyperlipidemia Home meds: Zocor 40 LDL 96, goal < 70 Now on Crestor 20 Continue statin at discharge  Other Stroke Risk Factors Advanced age  Other Active Problems AKI on CKD 3a, Cre 2.40-2.05->1.50, on IV fluid @ 100 For cytosis WBC 22.6-18.1->14.8  Hospital day # 2    Rosalin Hawking, MD PhD Stroke Neurology 10/30/2022 6:17 PM    To contact Stroke Continuity provider, please refer to [email or web address] After hours, contact General Neurology

## 2022-10-30 NOTE — Evaluation (Signed)
Physical Therapy Evaluation Patient Details Name: Glen Martinez MRN: [id number] DOB: 02-Apr-1931 Today's Date: 10/30/2022  History of Present Illness  Pt is an 89 y.o. male presenting via EMS 1/19 with facial droop, weakness, and slurred speech following a fall. MRI revealed large area of acute ischemia within the R MCA territory with petechial hemorrhage wuthin the infarcted territory. PMH significant for R PCA infarct, DM2, GERD, HLD, HTN, hypothyroidism, gout.  Clinical Impression   Pt admitted secondary to problem above with deficits below. PTA patient was living alone since his wife passed a few months ago (per pt). He reports daughter is flying in and has no family left in [place]. (At times difficult to understand).  Pt currently requires mod assist for bed mobility and OOB to chair. He has good rehab potential, and would be a candidate for AIR if he had family that could be with him 24/7. Currently recommending SNF as he does not clearly have this level of support.  Anticipate patient will benefit from PT to address problems listed below.Will continue to follow acutely to maximize functional mobility independence and safety.          Recommendations for follow up therapy are one component of a multi-disciplinary discharge planning process, led by the attending physician.  Recommendations may be updated based on patient status, additional functional criteria and insurance authorization.  Follow Up Recommendations Skilled nursing-short term rehab (<3 hours/day) Can patient physically be transported by private vehicle: No    Assistance Recommended at Discharge Frequent or constant Supervision/Assistance  Patient can return home with the following  A lot of help with walking and/or transfers;Assistance with cooking/housework;Assistance with feeding;Direct supervision/assist for medications management;Direct supervision/assist for financial management;Assist for transportation;Help with  stairs or ramp for entrance    Equipment Recommendations Wheelchair (measurements PT);Wheelchair cushion (measurements PT)  Recommendations for Other Services  OT consult;Speech consult    Functional Status Assessment Patient has had a recent decline in their functional status and demonstrates the ability to make significant improvements in function in a reasonable and predictable amount of time.     Precautions / Restrictions Precautions Precautions: Fall      Mobility  Bed Mobility Overal bed mobility: Needs Assistance Bed Mobility: Supine to Sit     Supine to sit: Mod assist, HOB elevated     General bed mobility comments: assist to bring LLE off EOB (pt assisting) and to raise torso; use of bed pad to scoot to EOB as pt unable to advance/scoot his left hemi-pelvis forward    Transfers Overall transfer level: Needs assistance Equipment used: None Transfers: Sit to/from Stand, Bed to chair/wheelchair/BSC Sit to Stand: Min assist   Step pivot transfers: Mod assist       General transfer comment: pt with difficulty sequencing his steps (RLE advancing and leaving LLE behind) with incr lean to his left despite chair being on his right. Assist to swing hips over to chair.    Ambulation/Gait                  Stairs            Wheelchair Mobility    Modified Rankin (Stroke Patients Only) Modified Rankin (Stroke Patients Only) Pre-Morbid Rankin Score: No symptoms Modified Rankin: Severe disability     Balance Overall balance assessment: Needs assistance Sitting-balance support: No upper extremity supported, Feet supported Sitting balance-Leahy Scale: Poor Sitting balance - Comments: slight lean to left; with cues can correct Postural control: Left lateral lean  Standing balance support: Single extremity supported, During functional activity Standing balance-Leahy Scale: Poor Standing balance comment: slight left lean                              Pertinent Vitals/Pain Pain Assessment Pain Assessment: No/denies pain    Home Living Family/patient expects to be discharged to:: Unsure Living Arrangements: Alone   Type of Home: House Home Access: Stairs to enter   Technical brewer of Steps: 1   Home Layout: One level Home Equipment: None      Prior Function Prior Level of Function : Independent/Modified Independent                     Hand Dominance   Dominant Hand: Right    Extremity/Trunk Assessment   Upper Extremity Assessment Upper Extremity Assessment: Defer to OT evaluation    Lower Extremity Assessment Lower Extremity Assessment: LLE deficits/detail LLE Deficits / Details: knee extension 4/5 when attending to LE, ankle DF 4/5 LLE Sensation:  (pt denies numbness)    Cervical / Trunk Assessment Cervical / Trunk Assessment: Other exceptions Cervical / Trunk Exceptions: weak on left and leans to his left  Communication   Communication: Expressive difficulties  Cognition Arousal/Alertness: Awake/alert Behavior During Therapy: Flat affect Overall Cognitive Status: No family/caregiver present to determine baseline cognitive functioning                                 General Comments: Oriented to self, location, ~situation (time NT); states "they told me I had a stroke" but cannot name any of his deficits.        General Comments      Exercises     Assessment/Plan    PT Assessment Patient needs continued PT services  PT Problem List Decreased strength;Decreased balance;Decreased mobility;Decreased cognition;Decreased knowledge of use of DME;Decreased safety awareness;Decreased knowledge of precautions       PT Treatment Interventions DME instruction;Gait training;Functional mobility training;Therapeutic activities;Balance training;Neuromuscular re-education;Cognitive remediation;Patient/family education    PT Goals (Current goals can be found in the Care Plan  section)  Acute Rehab PT Goals Patient Stated Goal: go somewhere in [place] PT Goal Formulation: With patient Time For Goal Achievement: 11/13/22 Potential to Achieve Goals: Good    Frequency Min 3X/week     Co-evaluation               AM-PAC PT "6 Clicks" Mobility  Outcome Measure Help needed turning from your back to your side while in a flat bed without using bedrails?: A Lot Help needed moving from lying on your back to sitting on the side of a flat bed without using bedrails?: A Lot Help needed moving to and from a bed to a chair (including a wheelchair)?: A Lot Help needed standing up from a chair using your arms (e.g., wheelchair or bedside chair)?: A Little Help needed to walk in hospital room?: Total Help needed climbing 3-5 steps with a railing? : Total 6 Click Score: 11    End of Session Equipment Utilized During Treatment: Gait belt Activity Tolerance: Patient tolerated treatment well Patient left: in chair;with call bell/phone within reach;with chair alarm set Nurse Communication: Mobility status;Need for lift equipment (recommend stedy for back to bed as pivoting/stepping his feet was difficult for him) PT Visit Diagnosis: Hemiplegia and hemiparesis Hemiplegia - Right/Left: Left Hemiplegia - dominant/non-dominant: Non-dominant Hemiplegia -  caused by: Cerebral infarction    Time: 2481-8590 PT Time Calculation (min) (ACUTE ONLY): 32 min   Charges:   PT Evaluation $PT Eval Moderate Complexity: 1 Mod PT Treatments $Neuromuscular Re-education: 8-22 mins         Arby Barrette, PT Acute Rehabilitation Services  Office [phone]   Rexanne Mano 10/30/2022, 2:55 PM

## 2022-10-31 ENCOUNTER — Inpatient Hospital Stay (HOSPITAL_COMMUNITY): Payer: Medicare HMO

## 2022-10-31 ENCOUNTER — Other Ambulatory Visit (HOSPITAL_COMMUNITY): Payer: Self-pay

## 2022-10-31 DIAGNOSIS — I4891 Unspecified atrial fibrillation: Secondary | ICD-10-CM | POA: Diagnosis not present

## 2022-10-31 DIAGNOSIS — T796XXA Traumatic ischemia of muscle, initial encounter: Secondary | ICD-10-CM | POA: Diagnosis not present

## 2022-10-31 DIAGNOSIS — N179 Acute kidney failure, unspecified: Secondary | ICD-10-CM | POA: Diagnosis not present

## 2022-10-31 DIAGNOSIS — R55 Syncope and collapse: Secondary | ICD-10-CM

## 2022-10-31 DIAGNOSIS — I63511 Cerebral infarction due to unspecified occlusion or stenosis of right middle cerebral artery: Secondary | ICD-10-CM | POA: Diagnosis not present

## 2022-10-31 LAB — COMPREHENSIVE METABOLIC PANEL
ALT: 57 U/L — ABNORMAL HIGH (ref 0–44)
AST: 86 U/L — ABNORMAL HIGH (ref 15–41)
Albumin: 3 g/dL — ABNORMAL LOW (ref 3.5–5.0)
Alkaline Phosphatase: 69 U/L (ref 38–126)
Anion gap: 7 (ref 5–15)
BUN: 25 mg/dL — ABNORMAL HIGH (ref 8–23)
CO2: 27 mmol/L (ref 22–32)
Calcium: 8.3 mg/dL — ABNORMAL LOW (ref 8.9–10.3)
Chloride: 111 mmol/L (ref 98–111)
Creatinine, Ser: 1.44 mg/dL — ABNORMAL HIGH (ref 0.61–1.24)
GFR, Estimated: 46 mL/min — ABNORMAL LOW (ref >60)
Glucose, Bld: 106 mg/dL — ABNORMAL HIGH (ref 70–99)
Potassium: 3.7 mmol/L (ref 3.5–5.1)
Sodium: 145 mmol/L (ref 135–145)
Total Bilirubin: 0.4 mg/dL (ref 0.3–1.2)
Total Protein: 5.9 g/dL — ABNORMAL LOW (ref 6.5–8.1)

## 2022-10-31 LAB — GLUCOSE, CAPILLARY
Glucose-Capillary: 142 mg/dL — ABNORMAL HIGH (ref 70–99)
Glucose-Capillary: 121 mg/dL — ABNORMAL HIGH (ref 70–99)
Glucose-Capillary: 165 mg/dL — ABNORMAL HIGH (ref 70–99)
Glucose-Capillary: 160 mg/dL — ABNORMAL HIGH (ref 70–99)
Glucose-Capillary: 224 mg/dL — ABNORMAL HIGH (ref 70–99)
Glucose-Capillary: 144 mg/dL — ABNORMAL HIGH (ref 70–99)

## 2022-10-31 LAB — CBC
HCT: 40.1 % (ref 39.0–52.0)
Hemoglobin: 12.9 g/dL — ABNORMAL LOW (ref 13.0–17.0)
MCH: 30.7 pg (ref 26.0–34.0)
MCHC: 32.2 g/dL (ref 30.0–36.0)
MCV: 95.5 fL (ref 80.0–100.0)
Platelets: 142 10*3/uL — ABNORMAL LOW (ref 150–400)
RBC: 4.2 MIL/uL — ABNORMAL LOW (ref 4.22–5.81)
RDW: 13.8 % (ref 11.5–15.5)
WBC: 11.9 10*3/uL — ABNORMAL HIGH (ref 4.0–10.5)
nRBC: 0 % (ref 0.0–0.2)

## 2022-10-31 LAB — CK: Total CK: 1907 U/L — ABNORMAL HIGH (ref 49–397)

## 2022-10-31 MED ORDER — LEVETIRACETAM IN NACL 1000 MG/100ML IV SOLN
1000.0000 mg | INTRAVENOUS | Status: AC
  Administered 2022-11-01: 1000 mg via INTRAVENOUS
  Filled 2022-10-31: qty 100

## 2022-10-31 MED ORDER — ROCURONIUM BROMIDE 10 MG/ML (PF) SYRINGE
PREFILLED_SYRINGE | INTRAVENOUS | Status: AC
  Administered 2022-11-01: 50 mg via INTRAVENOUS
  Filled 2022-10-31: qty 10

## 2022-10-31 MED ORDER — METOPROLOL TARTRATE 50 MG PO TABS
50.0000 mg | ORAL_TABLET | Freq: Two times a day (BID) | ORAL | Status: DC
  Administered 2022-10-31 (×2): 50 mg via ORAL
  Filled 2022-10-31 (×2): qty 1

## 2022-10-31 MED ORDER — FENTANYL CITRATE PF 50 MCG/ML IJ SOSY
PREFILLED_SYRINGE | INTRAMUSCULAR | Status: AC
  Administered 2022-11-01: 50 ug via INTRAVENOUS
  Filled 2022-10-31: qty 2

## 2022-10-31 MED ORDER — QUETIAPINE FUMARATE 25 MG PO TABS
25.0000 mg | ORAL_TABLET | Freq: Every day | ORAL | Status: DC
  Administered 2022-10-31: 25 mg via ORAL
  Filled 2022-10-31: qty 1

## 2022-10-31 MED ORDER — HALOPERIDOL LACTATE 5 MG/ML IJ SOLN
0.5000 mg | Freq: Once | INTRAMUSCULAR | Status: AC
  Administered 2022-10-31: 0.5 mg via INTRAVENOUS
  Filled 2022-10-31: qty 1

## 2022-10-31 MED ORDER — ETOMIDATE 2 MG/ML IV SOLN
INTRAVENOUS | Status: AC
  Administered 2022-11-01: 20 mg via INTRAVENOUS
  Filled 2022-10-31: qty 20

## 2022-10-31 NOTE — NC FL2 (Signed)
[place] LEVEL OF CARE FORM     IDENTIFICATION  Patient Name: Glen Martinez Birthdate: [birth year] Sex: male Admission Date (Current Location): 10/13/2022  [facility] and [place] Number:  Engineering geologist and Address:  [facility] [place]. [facility], [place] [address], [place], [place] [address]      Provider Number: [id number]  Attending Physician Name and Address:  Bonnielee Haff, MD  Relative Name and Phone Number:       Current Level of Care: Hospital Recommended Level of Care: [place] Prior Approval Number:    Date Approved/Denied:   PASRR Number: [id number]  Discharge Plan: SNF    Current Diagnoses: Patient Active Problem List   Diagnosis Date Noted   Acute ischemic right MCA stroke ([place]) 10/21/2022   Acute renal failure superimposed on stage 3a chronic kidney disease ([place]) 10/10/2022   Leukocytosis 10/23/2022   Elevated LFTs 10/29/2022   OSA (obstructive sleep apnea) 11/08/2022   Paroxysmal atrial fibrillation ([place]) 08/10/2021   Secondary hypercoagulable state ([place]) 08/10/2021   Hemorrhagic stroke ([place]) 07/10/2021   Abnormal ankle brachial index (ABI) 07/03/2019   Benign prostatic hyperplasia 04/09/2018   Hyperlipidemia associated with type 2 diabetes mellitus ([place]) 04/09/2018   Hypertension associated with diabetes ([place]) 04/09/2018   Hypothyroidism 04/09/2018   Personal history of gout 07/18/2016   Type 2 diabetes mellitus with stage 3 chronic kidney disease, with long-term current use of insulin ([place]) 11/20/2015   ED (erectile dysfunction) of organic origin 08/01/2012   Elevated prostate specific antigen (PSA) 08/01/2012   Nodular prostate with urinary obstruction 08/01/2012   Incomplete emptying of bladder 08/01/2012   Chronic prostatitis 08/01/2012   Disorder of male genital organs 08/01/2012   Urinary tract infection 08/01/2012    Orientation RESPIRATION BLADDER Height & Weight     Self, Time, Situation,  Place  Other (Comment) (CPAP at night) Incontinent Weight: 160 lb (72.6 kg) Height:  '5\' 6"'$  (167.6 cm)  BEHAVIORAL SYMPTOMS/MOOD NEUROLOGICAL BOWEL NUTRITION STATUS      Continent Diet (see DC summary)  AMBULATORY STATUS COMMUNICATION OF NEEDS Skin   Extensive Assist Verbally Skin abrasions                       Personal Care Assistance Level of Assistance  Bathing, Feeding, Dressing Bathing Assistance: Limited assistance Feeding assistance: Limited assistance Dressing Assistance: Limited assistance     Functional Limitations Info  Speech     Speech Info: Impaired (dysarthria)    SPECIAL CARE FACTORS FREQUENCY  PT (By licensed PT), OT (By licensed OT), Speech therapy     PT Frequency: 5x/wk OT Frequency: 5x/wk     Speech Therapy Frequency: 5x/wk      Contractures Contractures Info: Not present    Additional Factors Info  Code Status, Allergies, Insulin Sliding Scale Code Status Info: Full Allergies Info: NKA   Insulin Sliding Scale Info: see DC summary       Current Medications (10/31/2022):  This is the current hospital active medication list Current Facility-Administered Medications  Medication Dose Route Frequency Provider Last Rate Last Admin   acetaminophen (TYLENOL) suppository 650 mg  650 mg Rectal Q4H PRN Lenore Cordia, MD       aspirin tablet 325 mg  325 mg Oral Daily Bonnielee Haff, MD   325 mg at 10/31/22 0852   heparin injection 5,000 Units  5,000 Units Subcutaneous Lina Sar, MD   5,000 Units at 10/31/22 [phone]  insulin aspart (novoLOG) injection 0-15 Units  0-15 Units Subcutaneous Q4H Lenore Cordia, MD   2 Units at 10/31/22 0341   levothyroxine (SYNTHROID) tablet 88 mcg  88 mcg Oral Q0600 Bonnielee Haff, MD   88 mcg at 10/31/22 0532   metoprolol tartrate (LOPRESSOR) injection 2.5 mg  2.5 mg Intravenous Q6H PRN Bonnielee Haff, MD   2.5 mg at 10/31/22 0559   metoprolol tartrate (LOPRESSOR) tablet 50 mg  50 mg Oral BID Bonnielee Haff, MD    50 mg at 10/31/22 0853   rosuvastatin (CRESTOR) tablet 20 mg  20 mg Oral Daily Rosalin Hawking, MD   20 mg at 10/31/22 8338   senna-docusate (Senokot-S) tablet 1 tablet  1 tablet Oral QHS PRN Lenore Cordia, MD         Discharge Medications: Please see discharge summary for a list of discharge medications.  Relevant Imaging Results:  Relevant Lab Results:   Additional Information SS#: [id number]  Geralynn Ochs, LCSW

## 2022-10-31 NOTE — Progress Notes (Signed)
STROKE TEAM PROGRESS NOTE   SUBJECTIVE (INTERVAL HISTORY) No family is at the bedside. Per report, he had agitation this am and HR up with afib RVR. Patient received 0.'5mg'$  ativan and currently sedated, not easily waking up. Per RN, pt still has left sided weakness but stable and against gravity.    OBJECTIVE Temp:  [97.9 F (36.6 C)-99.5 F (37.5 C)] 98.6 F (37 C) (01/22 1123) Pulse Rate:  [67-124] 109 (01/22 1123) Cardiac Rhythm: Atrial fibrillation (01/22 0705) Resp:  [14-20] 17 (01/22 1123) BP: (112-171)/(65-103) 112/65 (01/22 1123) SpO2:  [96 %-100 %] 98 % (01/22 1123) FiO2 (%):  [21 %] 21 % (01/21 2215)  Recent Labs  Lab 10/30/22 2025 10/30/22 2330 10/31/22 0330 10/31/22 0804 10/31/22 1207  GLUCAP 149* 123* 142* 121* 165*   Recent Labs  Lab 11/07/2022 1919 10/11/2022 2000 11/07/2022 2000 10/29/22 0507 10/30/22 0402 10/31/22 0315  NA 139 140  --  140 139 145  K 4.1 3.5  --  3.4* 4.8 3.7  CL 104 101  --  104 109 111  CO2  --  21*  --  22 18* 27  GLUCOSE 199* 196*  --  124* 103* 106*  BUN 43* 29*  --  37* 33* 25*  CREATININE 2.40* 2.29*  --  2.05* 1.50* 1.44*  CALCIUM  --  8.8*   < > 8.3* 8.1* 8.3*   < > = values in this interval not displayed.   Recent Labs  Lab 11/04/2022 2000 10/29/22 0507 10/30/22 0402 10/31/22 0315  AST 223* 198* 134* 86*  ALT 59* 58* 54* 57*  ALKPHOS 92 83 75 69  BILITOT 1.5* 0.9 1.4* 0.4  PROT 7.4 6.5 6.1* 5.9*  ALBUMIN 4.0 3.6 3.2* 3.0*   Recent Labs  Lab 10/25/2022 1840 11/09/2022 1919 10/29/22 0507 10/30/22 0402 10/31/22 0315  WBC 22.6*  --  18.1* 14.8* 11.9*  NEUTROABS 19.6*  --   --   --   --   HGB 15.6 16.7 14.5 14.2 12.9*  HCT 47.4 49.0 42.3 42.8 40.1  MCV 93.9  --  91.6 94.9 95.5  PLT 199  --  164 142* 142*   Recent Labs  Lab 10/22/2022 2000 10/30/22 0402 10/31/22 0315  CKTOTAL 11,581* 4,099* 1,907*   Recent Labs    10/25/2022 1840 10/29/22 0507  LABPROT 14.9 15.0  INR 1.2 1.2   Recent Labs    10/30/2022 2014   COLORURINE YELLOW  LABSPEC 1.017  PHURINE 5.0  GLUCOSEU NEGATIVE  HGBUR LARGE*  BILIRUBINUR NEGATIVE  KETONESUR 5*  PROTEINUR 100*  NITRITE NEGATIVE  LEUKOCYTESUR NEGATIVE       Component Value Date/Time   CHOL 172 10/29/2022 0507   TRIG 102 10/29/2022 0507   HDL 56 10/29/2022 0507   CHOLHDL 3.1 10/29/2022 0507   VLDL 20 10/29/2022 0507   LDLCALC NOT CALCULATED 10/29/2022 0507   Lab Results  Component Value Date   HGBA1C 6.5 (H) 10/29/2022      Component Value Date/Time   LABOPIA NONE DETECTED 10/16/2022 2014   COCAINSCRNUR NONE DETECTED 10/14/2022 2014   LABBENZ NONE DETECTED 10/18/2022 2014   AMPHETMU NONE DETECTED 10/30/2022 2014   THCU NONE DETECTED 10/16/2022 2014   LABBARB NONE DETECTED 10/12/2022 2014    Recent Labs  Lab 11/02/2022 1840  ETH <10    I have personally reviewed the radiological images below and agree with the radiology interpretations.  DG Swallowing Func-Speech Pathology  Result Date: 10/31/2022 Table formatting from the original  result was not included. Images from the original result were not included. Objective Swallowing Evaluation: Type of Study: MBS-Modified Barium Swallow Study  Patient Details Name: Glen Martinez MRN: [id number] Date of Birth: March 30, 1931 Today's Date: 10/31/2022 Time: SLP Start Time (ACUTE ONLY): 0940 -SLP Stop Time (ACUTE ONLY): 1000 SLP Time Calculation (min) (ACUTE ONLY): 20 min Past Medical History: Past Medical History: Diagnosis Date  CVA (cerebral vascular accident) ([place]) 07/09/2021  No deficits  Diabetes mellitus without complication (HCC)   GERD (gastroesophageal reflux disease)   Gout   Hyperlipidemia   Hypertension   Hypothyroidism   PAF (paroxysmal atrial fibrillation) ([place])   Wears dentures   partial upper and lower Past Surgical History: Past Surgical History: Procedure Laterality Date  CATARACT EXTRACTION W/PHACO Left 05/02/2018  Procedure: CATARACT EXTRACTION PHACO AND INTRAOCULAR LENS PLACEMENT ([place]) DIABETIC;   Surgeon: Leandrew Koyanagi, MD;  Location: [facility];  Service: Ophthalmology;  Laterality: Left;  diabetic - insulin  CATARACT EXTRACTION W/PHACO Right 09/21/2022  Procedure: CATARACT EXTRACTION PHACO AND INTRAOCULAR LENS PLACEMENT ([facility]) RIGHT 12.41 01:15.0;  Surgeon: Leandrew Koyanagi, MD;  Location: [facility];  Service: Ophthalmology;  Laterality: Right;  Diabetic  COLONOSCOPY    HERNIA REPAIR    LOOP RECORDER INSERTION N/A 07/12/2021  Procedure: LOOP RECORDER INSERTION;  Surgeon: Evans Lance, MD;  Location: [facility];  Service: Cardiovascular;  Laterality: N/A;  TONSILLECTOMY   HPI: Pt is a 89 y.o. male who presented to the ED for evaluation of left-sided weakness, slurred speech, and left facial droop. MRI brain (1/19) revealed "shows a large area of acute ischemia within the posterior right MCA territory.  Petechial hemorrhage within the infarcted territory noted". SLE (07/10/21) post CVA significant for cognitive communication deficits with SLUMS score of 13/30. Passed [facility] 10/17/2022, however RN reports concern for pocketing of pills. BSE and SLE ordered. PMH: PAF on Coumadin, history of CVA, insulin-dependent T2DM, CKD stage IIIa, HTN, HLD, hypothyroidism, BPH, OSA on CPAP.  No data recorded  Recommendations for follow up therapy are one component of a multi-disciplinary discharge planning process, led by the attending physician.  Recommendations may be updated based on patient status, additional functional criteria and insurance authorization. Assessment / Plan / Recommendation   10/31/2022  10:37 AM Clinical Impressions Clinical Impression Pt presents with oropharyngeal dysphagia post CVA, marked by impaired mastication, poor bolus control, premature loss of liquids, reduced epiglottic inversion, reduced anterior hyoid excursion and delayed swallow initation, resulting in sensed aspiration of thin liquids (PAS 7). Chin tuck and smaller volumes not effective in eliminating  aspiration with this consistency. NTL by cup/straw resulted in x1 occurance of penetration below the cords, with material fully ejected spontaneously from airway with throat clear (PAS 6). Of note, poor BOT retraction and pharyngeal stripping resulted in min-mod residuals in vallecula and pyriform sinuses across trials, increasing with thicker liquid and regular solid consistencies. With a teaspoon of HTL, silent aspiration (PAS 8) after the swallow occured when pyriform residuals spilled into airway posteriorly. Effortful swallows, repeat dry swallows and NTL wash assisted with clearance of majority of oropharyngeal residuals across trials. Recommend continue dys 1 diet with NTL (cup or straw ok) with adherence to swallow precautions as indicated. Will f/u for tolerance. SLP Visit Diagnosis Dysphagia, oropharyngeal phase (R13.12) Impact on safety and function Moderate aspiration risk     10/31/2022  10:37 AM Treatment Recommendations Treatment Recommendations Therapy as outlined in treatment plan below     10/31/2022  10:37 AM Prognosis  Prognosis for Safe Diet Advancement Good Barriers to Reach Goals Severity of deficits;Cognitive deficits   10/31/2022  10:37 AM Diet Recommendations SLP Diet Recommendations Dysphagia 1 (Puree) solids;Nectar thick liquid Liquid Administration via Cup;Straw Medication Administration Crushed with puree Compensations Minimize environmental distractions;Slow rate;Small sips/bites;Lingual sweep for clearance of pocketing;Monitor for anterior loss;Multiple dry swallows after each bite/sip;Follow solids with liquid;Clear throat intermittently;Effortful swallow Postural Changes Seated upright at 90 degrees     10/31/2022  10:37 AM Other Recommendations Oral Care Recommendations Oral care BID Other Recommendations Order thickener from pharmacy Follow Up Recommendations Skilled nursing-short term rehab (<3 hours/day) Functional Status Assessment Patient has had a recent decline in their  functional status and demonstrates the ability to make significant improvements in function in a reasonable and predictable amount of time.   10/31/2022  10:37 AM Frequency and Duration  Speech Therapy Frequency (ACUTE ONLY) min 2x/week Treatment Duration 2 weeks     10/31/2022  10:37 AM Oral Phase Oral Phase Impaired Oral - Honey Teaspoon Lingual/palatal residue Oral - Nectar Teaspoon Lingual/palatal residue;Left anterior bolus loss;Left pocketing in lateral sulci;Decreased bolus cohesion Oral - Nectar Cup Lingual/palatal residue;Left anterior bolus loss;Left pocketing in lateral sulci;Decreased bolus cohesion Oral - Nectar Straw Lingual/palatal residue;Left anterior bolus loss;Left pocketing in lateral sulci;Decreased bolus cohesion Oral - Thin Teaspoon Lingual/palatal residue;Left anterior bolus loss;Left pocketing in lateral sulci;Decreased bolus cohesion;Premature spillage Oral - Thin Cup Lingual/palatal residue;Left anterior bolus loss;Left pocketing in lateral sulci;Decreased bolus cohesion;Premature spillage Oral - Thin Straw Lingual/palatal residue;Left anterior bolus loss;Left pocketing in lateral sulci;Decreased bolus cohesion;Premature spillage Oral - Puree Weak lingual manipulation;Lingual/palatal residue;Left pocketing in lateral sulci Oral - Regular Weak lingual manipulation;Lingual/palatal residue;Left pocketing in lateral sulci;Impaired mastication;Reduced posterior propulsion Oral - Pill NT    10/31/2022  10:37 AM Pharyngeal Phase Pharyngeal Phase Impaired Pharyngeal- Honey Teaspoon Delayed swallow initiation-pyriform sinuses;Reduced epiglottic inversion;Reduced anterior laryngeal mobility;Penetration/Apiration after swallow;Moderate aspiration;Pharyngeal residue - valleculae;Pharyngeal residue - pyriform;Pharyngeal residue - posterior pharnyx;Reduced tongue base retraction;Reduced pharyngeal peristalsis Pharyngeal Material enters airway, passes BELOW cords without attempt by patient to eject out  (silent aspiration) Pharyngeal- Nectar Teaspoon Delayed swallow initiation-pyriform sinuses;Reduced epiglottic inversion;Reduced anterior laryngeal mobility;Pharyngeal residue - valleculae;Pharyngeal residue - pyriform;Pharyngeal residue - posterior pharnyx;Reduced tongue base retraction;Reduced pharyngeal peristalsis;Penetration/Aspiration during swallow Pharyngeal- Nectar Cup Delayed swallow initiation-pyriform sinuses;Reduced epiglottic inversion;Reduced anterior laryngeal mobility;Pharyngeal residue - valleculae;Pharyngeal residue - pyriform;Pharyngeal residue - posterior pharnyx;Reduced tongue base retraction;Reduced pharyngeal peristalsis;Penetration/Aspiration during swallow Pharyngeal Material enters airway, passes BELOW cords then ejected out Pharyngeal- Nectar Straw Delayed swallow initiation-pyriform sinuses;Reduced epiglottic inversion;Reduced anterior laryngeal mobility;Pharyngeal residue - valleculae;Pharyngeal residue - pyriform;Pharyngeal residue - posterior pharnyx;Reduced tongue base retraction;Reduced pharyngeal peristalsis Pharyngeal Material does not enter airway Pharyngeal- Thin Teaspoon Delayed swallow initiation-pyriform sinuses;Reduced epiglottic inversion;Reduced anterior laryngeal mobility;Pharyngeal residue - valleculae;Pharyngeal residue - pyriform;Pharyngeal residue - posterior pharnyx;Reduced tongue base retraction;Reduced pharyngeal peristalsis;Penetration/Aspiration during swallow;Trace aspiration Pharyngeal Material enters airway, passes BELOW cords and not ejected out despite cough attempt by patient Pharyngeal- Thin Cup Delayed swallow initiation-pyriform sinuses;Reduced epiglottic inversion;Reduced anterior laryngeal mobility;Pharyngeal residue - valleculae;Pharyngeal residue - pyriform;Pharyngeal residue - posterior pharnyx;Reduced tongue base retraction;Reduced pharyngeal peristalsis;Penetration/Aspiration during swallow;Trace aspiration Pharyngeal Material enters airway,  passes BELOW cords and not ejected out despite cough attempt by patient Pharyngeal- Thin Straw Delayed swallow initiation-pyriform sinuses;Reduced epiglottic inversion;Reduced anterior laryngeal mobility;Pharyngeal residue - valleculae;Pharyngeal residue - pyriform;Pharyngeal residue - posterior pharnyx;Reduced tongue base retraction;Reduced pharyngeal peristalsis;Penetration/Aspiration during swallow;Trace aspiration Pharyngeal Material enters airway, passes BELOW cords and not ejected out despite cough attempt by patient Pharyngeal- Puree Delayed swallow  initiation-pyriform sinuses;Reduced epiglottic inversion;Reduced anterior laryngeal mobility;Pharyngeal residue - valleculae;Pharyngeal residue - pyriform;Pharyngeal residue - posterior pharnyx;Reduced tongue base retraction;Reduced pharyngeal peristalsis Pharyngeal- Regular Delayed swallow initiation-pyriform sinuses;Reduced epiglottic inversion;Reduced anterior laryngeal mobility;Pharyngeal residue - valleculae;Pharyngeal residue - pyriform;Pharyngeal residue - posterior pharnyx;Reduced tongue base retraction;Reduced pharyngeal peristalsis Pharyngeal- Pill NT     No data to display    Ellwood Dense, MA, [place] Office Number: [phone] Acie Fredrickson 10/31/2022, 11:09 AM                     ECHOCARDIOGRAM COMPLETE  Result Date: 10/29/2022    ECHOCARDIOGRAM REPORT   Patient Name:   Glen Martinez Keilman Date of Exam: 10/29/2022 Medical Rec #:  [id number]       Height:       66.0 in Accession #:    [id number]      Weight:       160.0 lb Date of Birth:  12/08/1930      BSA:          1.819 m Patient Age:    54 years        BP:           149/79 mmHg Patient Gender: M               HR:           118 bpm. Exam Location:  Inpatient Procedure: 2D Echo Indications:    stroke  History:        Patient has prior history of Echocardiogram examinations, most                 recent 07/10/2021. Stroke, Arrythmias:Atrial Fibrillation; Risk                  Factors:Hypertension, Diabetes and Dyslipidemia.  Sonographer:    Harvie Junior Referring Phys: [id number] [place]  1. In Afib with RVR during study. Left ventricular ejection fraction, by estimation, is 50 to 55%. The left ventricle has low normal function. The left ventricle has no regional wall motion abnormalities. There is mild left ventricular hypertrophy. Left  ventricular diastolic parameters are indeterminate.  2. Right ventricular systolic function is normal. The right ventricular size is normal. There is normal pulmonary artery systolic pressure. The estimated right ventricular systolic pressure is 09.3 mmHg.  3. The mitral valve is normal in structure. Trivial mitral valve regurgitation.  4. The aortic valve is tricuspid. Aortic valve regurgitation is mild. Aortic valve sclerosis is present, with no evidence of aortic valve stenosis.  5. The inferior vena cava is normal in size with greater than 50% respiratory variability, suggesting right atrial pressure of 3 mmHg. FINDINGS  Left Ventricle: Left ventricular ejection fraction, by estimation, is 50 to 55%. The left ventricle has low normal function. The left ventricle has no regional wall motion abnormalities. The left ventricular internal cavity size was normal in size. There is mild left ventricular hypertrophy. Left ventricular diastolic parameters are indeterminate. Right Ventricle: The right ventricular size is normal. No increase in right ventricular wall thickness. Right ventricular systolic function is normal. There is normal pulmonary artery systolic pressure. The tricuspid regurgitant velocity is 2.33 m/s, and  with an assumed right atrial pressure of 3 mmHg, the estimated right ventricular systolic pressure is 26.7 mmHg. Left Atrium: Left atrial size was normal in size. Right Atrium: Right atrial size was normal in size. Pericardium: There is no evidence of pericardial effusion. Mitral Valve:  The mitral valve is normal in  structure. Trivial mitral valve regurgitation. Tricuspid Valve: The tricuspid valve is normal in structure. Tricuspid valve regurgitation is trivial. Aortic Valve: The aortic valve is tricuspid. Aortic valve regurgitation is mild. Aortic regurgitation PHT measures 579 msec. Aortic valve sclerosis is present, with no evidence of aortic valve stenosis. Aortic valve mean gradient measures 1.2 mmHg. Aortic valve peak gradient measures 2.1 mmHg. Aortic valve area, by VTI measures 2.83 cm. Pulmonic Valve: The pulmonic valve was not well visualized. Pulmonic valve regurgitation is trivial. Aorta: The aortic root and ascending aorta are structurally normal, with no evidence of dilitation. Venous: The inferior vena cava is normal in size with greater than 50% respiratory variability, suggesting right atrial pressure of 3 mmHg. IAS/Shunts: The interatrial septum was not well visualized.  LEFT VENTRICLE PLAX 2D LVIDd:         4.30 cm     Diastology LVIDs:         3.20 cm     LV e' medial:    6.05 cm/s LV PW:         1.20 cm     LV E/e' medial:  12.9 LV IVS:        1.20 cm     LV e' lateral:   8.50 cm/s LVOT diam:     2.00 cm     LV E/e' lateral: 9.2 LV SV:         34 LV SV Index:   19 LVOT Area:     3.14 cm  LV Volumes (MOD) LV vol d, MOD A2C: 49.0 ml LV vol d, MOD A4C: 57.5 ml LV vol s, MOD A2C: 23.2 ml LV vol s, MOD A4C: 28.6 ml LV SV MOD A2C:     25.8 ml LV SV MOD A4C:     57.5 ml LV SV MOD BP:      29.0 ml RIGHT VENTRICLE RV Basal diam:  2.90 cm RV Mid diam:    2.40 cm RV S prime:     11.30 cm/s TAPSE (M-mode): 1.5 cm LEFT ATRIUM             Index        RIGHT ATRIUM           Index LA diam:        4.10 cm 2.25 cm/m   RA Area:     11.60 cm LA Vol (A2C):   35.7 ml 19.63 ml/m  RA Volume:   25.00 ml  13.74 ml/m LA Vol (A4C):   56.3 ml 30.95 ml/m LA Biplane Vol: 45.9 ml 25.24 ml/m  AORTIC VALVE                    PULMONIC VALVE AV Area (Vmax):    2.77 cm     PV Vmax:          0.72 m/s AV Area (Vmean):   2.74 cm      PV Peak grad:     2.1 mmHg AV Area (VTI):     2.83 cm     PR End Diast Vel: 5.57 msec AV Vmax:           72.40 cm/s AV Vmean:          52.450 cm/s AV VTI:            0.119 m AV Peak Grad:      2.1 mmHg AV Mean Grad:      1.2  mmHg LVOT Vmax:         63.92 cm/s LVOT Vmean:        45.680 cm/s LVOT VTI:          0.107 m LVOT/AV VTI ratio: 0.90 AI PHT:            579 msec  AORTA Ao Root diam: 3.60 cm Ao Asc diam:  3.20 cm MITRAL VALVE               TRICUSPID VALVE MV Area (PHT): 3.39 cm    TR Peak grad:   21.7 mmHg MV Decel Time: 224 msec    TR Vmax:        233.00 cm/s MR Peak grad: 28.3 mmHg MR Vmax:      266.00 cm/s  SHUNTS MV E velocity: 78.00 cm/s  Systemic VTI:  0.11 m MV A velocity: 20.10 cm/s  Systemic Diam: 2.00 cm MV E/A ratio:  3.88 Oswaldo Milian MD Electronically signed by Oswaldo Milian MD Signature Date/Time: 10/29/2022/3:45:51 PM    Final    VAS US CAROTID  Result Date: 10/29/2022 Carotid Arterial Duplex Study Patient Name:  Glen Martinez Primo  Date of Exam:   10/29/2022 Medical Rec #: [id number]        Accession #:    [id number] Date of Birth: October 07, 1931       Patient Gender: M Patient Age:   66 years Exam Location:  [facility] Procedure:      VAS US CAROTID Referring Phys: Alferd Patee [facility] --------------------------------------------------------------------------------  Indications:       CVA. Risk Factors:      Hypertension, hyperlipidemia, Diabetes. Comparison Study:  No prior studies. Performing Technologist: Oliver Hum RVT  Examination Guidelines: A complete evaluation includes B-mode imaging, spectral Doppler, color Doppler, and power Doppler as needed of all accessible portions of each vessel. Bilateral testing is considered an integral part of a complete examination. Limited examinations for reoccurring indications may be performed as noted.  Right Carotid Findings: +----------+--------+--------+--------+-----------------------+--------+           PSV cm/sEDV  cm/sStenosisPlaque Description     Comments +----------+--------+--------+--------+-----------------------+--------+ CCA Prox  83      14              smooth and heterogenous         +----------+--------+--------+--------+-----------------------+--------+ CCA Distal65      17              smooth and heterogenous         +----------+--------+--------+--------+-----------------------+--------+ ICA Prox  25      7               heterogenous and smooth         +----------+--------+--------+--------+-----------------------+--------+ ICA Distal42      12                                     tortuous +----------+--------+--------+--------+-----------------------+--------+ ECA       92      1                                               +----------+--------+--------+--------+-----------------------+--------+ +----------+--------+-------+--------+-------------------+           PSV cm/sEDV cmsDescribeArm Pressure (mmHG) +----------+--------+-------+--------+-------------------+ [id number]                                         +----------+--------+-------+--------+-------------------+ +---------+--------+--+--------+-+---------+  VertebralPSV cm/s24EDV cm/s5Antegrade +---------+--------+--+--------+-+---------+  Left Carotid Findings: +----------+--------+--------+--------+-----------------------+--------+           PSV cm/sEDV cm/sStenosisPlaque Description     Comments +----------+--------+--------+--------+-----------------------+--------+ CCA Prox  94      14              smooth and heterogenous         +----------+--------+--------+--------+-----------------------+--------+ CCA Distal72      11              smooth and heterogenous         +----------+--------+--------+--------+-----------------------+--------+ ICA Prox  33      12              smooth and heterogenous          +----------+--------+--------+--------+-----------------------+--------+ ICA Distal37      18                                     tortuous +----------+--------+--------+--------+-----------------------+--------+ ECA       61      7                                               +----------+--------+--------+--------+-----------------------+--------+ +----------+--------+--------+--------+-------------------+           PSV cm/sEDV cm/sDescribeArm Pressure (mmHG) +----------+--------+--------+--------+-------------------+ Subclavian110                                         +----------+--------+--------+--------+-------------------+ +---------+--------+--+--------+--+---------+ VertebralPSV cm/s25EDV cm/s10Antegrade +---------+--------+--+--------+--+---------+   Summary: Right Carotid: Velocities in the right ICA are consistent with a 1-39% stenosis. Left Carotid: Velocities in the left ICA are consistent with a 1-39% stenosis. Vertebrals: Bilateral vertebral arteries demonstrate antegrade flow. *See table(s) above for measurements and observations.     Preliminary    MR ANGIO HEAD WO CONTRAST  Result Date: 11/07/2022 CLINICAL DATA:  Acute neurologic deficit EXAM: MRA HEAD WITHOUT CONTRAST TECHNIQUE: Angiographic images of the Circle of Willis were acquired using MRA technique without intravenous contrast. COMPARISON:  None Available. FINDINGS: POSTERIOR CIRCULATION: --Vertebral arteries: Normal --Inferior cerebellar arteries: Normal. --Basilar artery: Normal. --Superior cerebellar arteries: Normal. --Posterior cerebral arteries: Normal. ANTERIOR CIRCULATION: --Intracranial internal carotid arteries: Normal. --Anterior cerebral arteries (ACA): Normal. --Middle cerebral arteries (MCA): Normal. IMPRESSION: Normal intracranial MRA. Electronically Signed   By: Ulyses Jarred M.D.   On: 10/25/2022 23:59   CT Head Wo Contrast  Result Date: 10/16/2022 CLINICAL DATA:  Neurologic  deficit.  Stroke suspected. EXAM: CT HEAD WITHOUT CONTRAST TECHNIQUE: Contiguous axial images were obtained from the base of the skull through the vertex without intravenous contrast. RADIATION DOSE REDUCTION: This exam was performed according to the departmental dose-optimization program which includes automated exposure control, adjustment of the mA and/or kV according to patient size and/or use of iterative reconstruction technique. COMPARISON:  Head CT dated 07/10/2021. FINDINGS: Evaluation of this exam is limited due to motion artifact. Brain: An area of hypodensity in the right posterior temporal and parietal lobes consistent with edema and infarct. There is minimal associated mass effect on the adjacent brain parenchyma. No midline shift. Areas of slight higher attenuation within the infarct suspicious for petechial hemorrhage. Evaluation is limited due to motion. There is background of mild age-related  atrophy and chronic microvascular ischemic changes. Vascular: Suboptimally evaluated due to motion. Skull: Normal. Negative for fracture or focal lesion. Sinuses/Orbits: Partial opacification of a left ethmoid air cells. The remainder of the visualized paranasal sinuses and mastoid air cells are clear. Other: None IMPRESSION: 1. Right MCA territory infarct with findings suspicious for small petechial hemorrhage. No midline shift. 2. Mild age-related atrophy and chronic microvascular ischemic changes. These results were called by telephone at the time of interpretation on 11/06/2022 at 9:32 pm to Dr Alvino Chapel, who verbally acknowledged these results. Electronically Signed   By: Anner Crete M.D.   On: 10/16/2022 21:34   MR BRAIN WO CONTRAST  Result Date: 11/04/2022 CLINICAL DATA:  Acute neurologic deficit EXAM: MRI HEAD WITHOUT CONTRAST TECHNIQUE: Multiplanar, multiecho pulse sequences of the brain and surrounding structures were obtained without intravenous contrast. COMPARISON:  07/09/2021 FINDINGS:  Brain: There is a large area of acute ischemia within the posterior right MCA territory. There is petechial hemorrhage within the infarcted territory. 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 There is multifocal hyperintense T2-weighted signal within the periventricular and deep white matter. Old right PCA territory infarct. Normal midline structures. Vascular: Normal flow voids. Skull and upper cervical spine: Normal marrow signal. Sinuses/Orbits: Paranasal sinuses are clear. No mastoid effusion. Normal orbits. Bilateral ocular lens replacements. Other: Negative IMPRESSION: 1. Large area of acute ischemia within the posterior right MCA territory. Petechial hemorrhage within the infarcted territory. No mass effect or midline shift. Heidelberg classification 1a: HI1, scattered small petechiae, no mass effect. 2. Old right PCA territory infarct. Electronically Signed   By: Ulyses Jarred M.D.   On: 10/25/2022 21:25   CT Cervical Spine Wo Contrast  Result Date: 10/30/2022 CLINICAL DATA:  Neck trauma EXAM: CT CERVICAL SPINE WITHOUT CONTRAST TECHNIQUE: Multidetector CT imaging of the cervical spine was performed without intravenous contrast. Multiplanar CT image reconstructions were also generated. RADIATION DOSE REDUCTION: This exam was performed according to the departmental dose-optimization program which includes automated exposure control, adjustment of the mA and/or kV according to patient size and/or use of iterative reconstruction technique. COMPARISON:  None Available. FINDINGS: Alignment: Normal. Skull base and vertebrae: No acute fracture identified. No focal osseous lesion. Soft tissues and spinal canal: No prevertebral fluid or swelling. No visible canal hematoma. Disc levels: Large anterior osteophytes are seen at C4, C5, C6 and C7. Disc spaces are preserved. No severe central canal or neural foraminal stenosis identified at any level. Upper chest: Negative. Other: None.  IMPRESSION: 1. No acute fracture or subluxation of the cervical spine. 2. Degenerative changes of the cervical spine. Electronically Signed   By: Ronney Asters M.D.   On: 10/24/2022 20:51   DG Chest Port 1 View  Result Date: 10/16/2022 CLINICAL DATA:  Pneumonia and cough EXAM: PORTABLE CHEST 1 VIEW COMPARISON:  12/12/2019 FINDINGS: Loop recorder is present. Slightly shallow inspiration. Heart size and pulmonary vascularity are normal. Lungs are clear. No pleural effusions. No pneumothorax. Mediastinal contours appear intact. IMPRESSION: No active disease. Electronically Signed   By: Lucienne Capers M.D.   On: 10/20/2022 19:46   DG Knee 2 Views Left  Result Date: 11/06/2022 CLINICAL DATA:  Fall EXAM: LEFT KNEE - 1-2 VIEW COMPARISON:  None Available. FINDINGS: There is no acute fracture or dislocation. Joint spaces are well maintained. There is no joint effusion. Peripheral vascular calcifications are present. There is bubbly cortical lucency in the proximal femoral diaphysis measuring 2.4 x 0.5 cm. IMPRESSION: 1. No acute fracture  or dislocation. 2. Bubbly cortical lucency in the proximal femoral diaphysis measuring 2.4 x 0.5 cm. This is nonspecific and may represent a nonossifying fibroma. Electronically Signed   By: Ronney Asters M.D.   On: 10/29/2022 19:10   CUP PACEART REMOTE DEVICE CHECK  Result Date: 10/05/2022 ILR summary report received. Battery status OK. Normal device function. No new symptom, tachy, brady, or pause episodes. Persistent AF/AFL, controlled rates, burden 86.6%, Eliquis Monthly summary reports and ROV/PRN LA    PHYSICAL EXAM  Temp:  [97.9 F (36.6 C)-99.5 F (37.5 C)] 98.6 F (37 C) (01/22 1123) Pulse Rate:  [67-124] 109 (01/22 1123) Resp:  [14-20] 17 (01/22 1123) BP: (112-171)/(65-103) 112/65 (01/22 1123) SpO2:  [96 %-100 %] 98 % (01/22 1123) FiO2 (%):  [21 %] 21 % (01/21 2215)  General - Well nourished, well developed, sedated with low dose  ativan.  Ophthalmologic - fundi not visualized due to noncooperation.  Cardiovascular - irregularly irregular heart rate and rhythm.  Neuro - sedated with low dose ativan, not open eyes on voice but moaning with tactile stimulation. Severe dysarthria and intangible words, not following all simple commands. No gaze palsy but right gaze preference, left hemianopia.  Left facial droop. Tongue midline.  Right upper and lower extremity at least 4/5, left upper extremity 2/5, left lower extremity 3+/5. Sensation symmetrical bilaterally subjectively, right FTN intact, gait not tested.     ASSESSMENT/PLAN Glen Martinez is a 89 y.o. male with history of hypertension, hyperlipidemia, diabetes, A-fib on Coumadin, stroke admitted for left-sided weakness, left facial droop, slurred speech and fell at home. No tPA given due to outside window.    Stroke:  right MCA large infarct with petechial hemorrhage, embolic pattern, secondary to A-fib on Coumadin with subtherapeutic INR CT right MCA large infarct with petechial hemorrhage, old right PCA infarct MRI right MCA large infarct with HI1 hemorrhagic transformation, questionable right cerebellar punctate infarct, old right PCA infarct. MRA unremarkable Carotid Doppler unremarkable 2D Echo EF 50 to 55% LDL 96 HgbA1c 6.5 UDS negative Heparin subcu for VTE prophylaxis warfarin daily prior to admission, now on aspirin 325 mg daily.  Given the size of the stroke and HT, will consider eliquis in 3 days.  Patient counseled to be compliant with his antithrombotic medications Ongoing aggressive stroke risk factor management Therapy recommendations: SNF Disposition: Pending  History of stroke 07/2021 admitted for dizziness and fall.  MRI showed large right PCA infarct with HI 2 hemorrhagic transformation as well as small left PCA infarct.  CTA head and neck right P3 occlusion.  EF 65 to 70%.  No DVT.  LDL 42, A1c 7.2.  Loop recorder placed.  Discharged on  aspirin 81 and Zocor 40  A-fib with RVR A-fib was found after loop placement Started on Eliquis first Eliquis later changed to Coumadin due to cost INR 1.2 on admission Currently on aspirin 325 On metoprolol Will consider Eliquis in 3 days Need social work consult for financial assistance  Diabetes HgbA1c 6.5 goal < 7.0 Controlled CBG monitoring SSI DM education and close PCP follow up  Hypertension Stable BP goal <160 Long term BP goal normotensive  Hyperlipidemia Home meds: Zocor 40 LDL 96, goal < 70 Now on Crestor 20 Continue statin at discharge  Other Stroke Risk Factors Advanced age  Other Active Problems AKI on CKD 3a, Cre 2.40-2.05->1.50->1.44, on IV fluid @ 100 Leukocytosis WBC 22.6-18.1->14.8->11.9  Hospital day # 3    Rosalin Hawking, MD PhD Stroke Neurology 10/31/2022  3:02 PM    To contact Stroke Continuity provider, please refer to [email or web address] After hours, contact General Neurology

## 2022-10-31 NOTE — Progress Notes (Signed)
[facility] Summary Report / Loop Recorder

## 2022-10-31 NOTE — Care Management Important Message (Signed)
Important Message  Patient Details  Name: Glen Martinez MRN: [id number] Date of Birth: Nov 13, 1930   Medicare Important Message Given:  Yes     Orbie Pyo 10/31/2022, 2:56 PM

## 2022-10-31 NOTE — Progress Notes (Signed)
[facility] PROGRESS NOTE   Glen Martinez ZOX:[id number] DOB: 1931-07-13 DOA: 10/22/2022  PCP: Tracie Harrier, MD  Brief History/Interval Summary:  89 y.o. male with medical history significant for PAF on Coumadin, history of CVA, insulin-dependent T2DM, CKD stage IIIa, HTN, HLD, hypothyroidism, BPH, OSA on CPAP who presented to the ED for evaluation of left-sided weakness, slurred speech, and left facial droop.  Apparently he was found on the floor next to his bed by his granddaughter.  Found to have acute stroke.  CK level was also elevated.  Patient was hospitalized for further management.  Consultants: Neurology  Procedures: Echocardiogram    Subjective/Interval History: Patient remains dysarthric.  Some agitation noted this morning.  Heart rate noted to be elevated.  Patient denies any chest pain or shortness of breath.      Assessment/Plan:  Acute stroke Patient appears to have sustained a right MCA stroke.  Patient with left-sided weakness and dysarthria. Neurology is following. Was on warfarin prior to admission which is on hold.  Currently on aspirin.  Neurology plans to consider reinitiating Eliquis in 5 to 7 days poststroke. Lipid panel was done.  LDL could not be calculated.  HDL is 56.  Total cholesterol is 172.  Patient is on rosuvastatin. HbA1c 6.5 Echocardiogram shows EF to be 50 to 55%. Speech therapy is following.  Currently on dysphagia 1 diet. Seen by physical therapy.  SNF is recommended for short-term rehab. Mildly agitated this morning.  Haldol x 1.  Acute kidney injury on chronic kidney disease stage IIIa/rhabdomyolysis/hypokalemia Baseline creatinine around 1.4-1.5.  Presented with creatinine of 2.4.  This is in the setting of ACE inhibitor use which is currently on hold. CK level was noted to be elevated suggesting rhabdomyolysis.  He was found lying on the floor next to his bed.  Unclear how long he was on the floor. Renal function  gradually improving and seems to be back to baseline.  CK level has improved from greater than 11,000 to 1907. Monitor urine output.  Continue to monitor labs daily. Avoid nephrotoxic agents. Stop IV fluids.  Paroxysmal atrial fibrillation with RVR Continue with metoprolol.  Monitor on telemetry. Was anticoagulated with warfarin.  Apparently he was on Eliquis previously but it was switched over due to cost issues.  INR was subtherapeutic at admission. Anticoagulation on hold for now.  Neurology plans to reinitiate about 5 to 7 days poststroke. Heart rate is poorly controlled.  Will increase the dose of metoprolol.  Continue IV metoprolol as needed.  Hopefully we can avoid infusions. Agitation is likely contributing as well.  Leukocytosis Presented with WBC of 22.6.  No clear infectious etiology identified.  WBC has been improving suggesting that this could have been reactive.  Continue to monitor.  He is afebrile.  UA was negative for UTI.  Chest x-ray negative for pneumonia.  Follow-up on blood cultures.  Continue to monitor off of antibiotics.  Essential hypertension Some degree of permissive hypertension was allowed.  Monitor closely.  Diabetes mellitus type 2, with renal complications with CKD stage IIIa Monitor CBGs.  Continue SSI.  Abnormal LFTs Most likely due to rhabdomyolysis.  Levels are improving. Abdomen is benign. Can hold off on imaging studies if liver function returns to normal. Hepatitis panel was negative.  Obstructive sleep apnea CPAP  Hypothyroidism Continue levothyroxine  Hyperlipidemia Was on simvastatin prior to admission.  Currently on rosuvastatin.  BPH Was on Proscar prior to admission  Left Thigh Lesion Patient noted to have lump  in the left thigh area.  Soft to touch.  Nontender.  No erythema noted.  Could be a lipoma.  No changes noted on examination today.  Skin abrasions secondary to fall Stable.  He was given tetanus shot since vaccination  status was unknown.  DVT Prophylaxis: Anticoagulation currently on hold.  SCDs. Code Status: Full code as of now Family Communication: Granddaughter was updated yesterday.  Will update family again later today. Disposition Plan: SNF  Status is: Inpatient Remains inpatient appropriate because: Acute stroke      Medications: Scheduled:  aspirin  325 mg Oral Daily   heparin injection (subcutaneous)  5,000 Units Subcutaneous Q8H   insulin aspart  0-15 Units Subcutaneous Q4H   levothyroxine  88 mcg Oral Q0600   metoprolol tartrate  50 mg Oral BID   rosuvastatin  20 mg Oral Daily   Continuous:  0.9 % NaCl with KCl 20 mEq / L 50 mL/hr at 10/30/22 2351   PRN:[DISCONTINUED] acetaminophen **OR** [DISCONTINUED] acetaminophen (TYLENOL) oral liquid 160 mg/5 mL **OR** acetaminophen, metoprolol tartrate, senna-docusate  Antibiotics: Anti-infectives (From admission, onward)    None       Objective:  Vital Signs  Vitals:   10/31/22 0328 10/31/22 0333 10/31/22 0558 10/31/22 0734  BP:  (!) 166/75 (!) 141/70 122/76  Pulse: 100 (!) 117  (!) 118  Resp:  '18 14 16  '$ Temp: 97.9 F (36.6 C) 97.9 F (36.6 C)  98.2 F (36.8 C)  TempSrc: Oral Oral  Oral  SpO2: 100% 99%  99%  Weight:      Height:        Intake/Output Summary (Last 24 hours) at 10/31/2022 1011 Last data filed at 10/31/2022 0541 Gross per 24 hour  Intake 2234.2 ml  Output 1200 ml  Net 1034.2 ml    Filed Weights   11/03/2022 1834  Weight: 72.6 kg    General appearance: Awake alert.  In no distress.  Distracted Resp: Clear to auscultation bilaterally.  Normal effort Cardio: S1-S2 is irregularly, tachycardic. GI: Abdomen is soft.  Nontender nondistended.  Bowel sounds are present normal.  No masses organomegaly Extremities: No edema.  Soft tissue swelling in the left thigh is stable. Neurologic: Left hemiparesis noted.   Lab Results:  Data Reviewed: I have personally reviewed following labs and reports of the  imaging studies  CBC: Recent Labs  Lab 10/18/2022 1840 10/18/2022 1919 10/29/22 0507 10/30/22 0402 10/31/22 0315  WBC 22.6*  --  18.1* 14.8* 11.9*  NEUTROABS 19.6*  --   --   --   --   HGB 15.6 16.7 14.5 14.2 12.9*  HCT 47.4 49.0 42.3 42.8 40.1  MCV 93.9  --  91.6 94.9 95.5  PLT 199  --  164 142* 142*     Basic Metabolic Panel: Recent Labs  Lab 10/15/2022 1919 11/07/2022 2000 10/29/22 0507 10/30/22 0402 10/31/22 0315  NA 139 140 140 139 145  K 4.1 3.5 3.4* 4.8 3.7  CL 104 101 104 109 111  CO2  --  21* 22 18* 27  GLUCOSE 199* 196* 124* 103* 106*  BUN 43* 29* 37* 33* 25*  CREATININE 2.40* 2.29* 2.05* 1.50* 1.44*  CALCIUM  --  8.8* 8.3* 8.1* 8.3*     GFR: Estimated Creatinine Clearance: 31.4 mL/min (A) (by C-G formula based on SCr of 1.44 mg/dL (H)).  Liver Function Tests: Recent Labs  Lab 10/12/2022 2000 10/29/22 0507 10/30/22 0402 10/31/22 0315  AST 223* 198* 134* 86*  ALT 59*  58* 54* 57*  ALKPHOS 92 83 75 69  BILITOT 1.5* 0.9 1.4* 0.4  PROT 7.4 6.5 6.1* 5.9*  ALBUMIN 4.0 3.6 3.2* 3.0*      Coagulation Profile: Recent Labs  Lab 10/14/2022 1840 10/29/22 0507  INR 1.2 1.2     Cardiac Enzymes: Recent Labs  Lab 10/23/2022 2000 10/30/22 0402 10/31/22 0315  CKTOTAL 11,581* 4,099* 1,907*      HbA1C: Recent Labs    10/29/22 0507  HGBA1C 6.5*     CBG: Recent Labs  Lab 10/30/22 1605 10/30/22 2025 10/30/22 2330 10/31/22 0330 10/31/22 0804  GLUCAP 179* 149* 123* 142* 121*     Lipid Profile: Recent Labs    10/29/22 0507  CHOL 172  HDL 56  LDLCALC NOT CALCULATED  TRIG 102  CHOLHDL 3.1      Radiology Studies: ECHOCARDIOGRAM COMPLETE  Result Date: 10/29/2022    ECHOCARDIOGRAM REPORT   Patient Name:   ASHLY GOETHE Morrone Date of Exam: 10/29/2022 Medical Rec #:  [id number]       Height:       66.0 in Accession #:    [id number]      Weight:       160.0 lb Date of Birth:  [birth year]      BSA:          1.819 m Patient Age:    27 years         BP:           149/79 mmHg Patient Gender: M               HR:           118 bpm. Exam Location:  Inpatient Procedure: 2D Echo Indications:    stroke  History:        Patient has prior history of Echocardiogram examinations, most                 recent 07/10/2021. Stroke, Arrythmias:Atrial Fibrillation; Risk                 Factors:Hypertension, Diabetes and Dyslipidemia.  Sonographer:    Harvie Junior Referring Phys: [id number] [place]  1. In Afib with RVR during study. Left ventricular ejection fraction, by estimation, is 50 to 55%. The left ventricle has low normal function. The left ventricle has no regional wall motion abnormalities. There is mild left ventricular hypertrophy. Left  ventricular diastolic parameters are indeterminate.  2. Right ventricular systolic function is normal. The right ventricular size is normal. There is normal pulmonary artery systolic pressure. The estimated right ventricular systolic pressure is 81.8 mmHg.  3. The mitral valve is normal in structure. Trivial mitral valve regurgitation.  4. The aortic valve is tricuspid. Aortic valve regurgitation is mild. Aortic valve sclerosis is present, with no evidence of aortic valve stenosis.  5. The inferior vena cava is normal in size with greater than 50% respiratory variability, suggesting right atrial pressure of 3 mmHg. FINDINGS  Left Ventricle: Left ventricular ejection fraction, by estimation, is 50 to 55%. The left ventricle has low normal function. The left ventricle has no regional wall motion abnormalities. The left ventricular internal cavity size was normal in size. There is mild left ventricular hypertrophy. Left ventricular diastolic parameters are indeterminate. Right Ventricle: The right ventricular size is normal. No increase in right ventricular wall thickness. Right ventricular systolic function is normal. There is normal pulmonary artery systolic pressure. The tricuspid regurgitant velocity is 2.33 m/s, and  with an assumed right atrial pressure of 3 mmHg, the estimated right ventricular systolic pressure is 60.1 mmHg. Left Atrium: Left atrial size was normal in size. Right Atrium: Right atrial size was normal in size. Pericardium: There is no evidence of pericardial effusion. Mitral Valve: The mitral valve is normal in structure. Trivial mitral valve regurgitation. Tricuspid Valve: The tricuspid valve is normal in structure. Tricuspid valve regurgitation is trivial. Aortic Valve: The aortic valve is tricuspid. Aortic valve regurgitation is mild. Aortic regurgitation PHT measures 579 msec. Aortic valve sclerosis is present, with no evidence of aortic valve stenosis. Aortic valve mean gradient measures 1.2 mmHg. Aortic valve peak gradient measures 2.1 mmHg. Aortic valve area, by VTI measures 2.83 cm. Pulmonic Valve: The pulmonic valve was not well visualized. Pulmonic valve regurgitation is trivial. Aorta: The aortic root and ascending aorta are structurally normal, with no evidence of dilitation. Venous: The inferior vena cava is normal in size with greater than 50% respiratory variability, suggesting right atrial pressure of 3 mmHg. IAS/Shunts: The interatrial septum was not well visualized.  LEFT VENTRICLE PLAX 2D LVIDd:         4.30 cm     Diastology LVIDs:         3.20 cm     LV e' medial:    6.05 cm/s LV PW:         1.20 cm     LV E/e' medial:  12.9 LV IVS:        1.20 cm     LV e' lateral:   8.50 cm/s LVOT diam:     2.00 cm     LV E/e' lateral: 9.2 LV SV:         34 LV SV Index:   19 LVOT Area:     3.14 cm  LV Volumes (MOD) LV vol d, MOD A2C: 49.0 ml LV vol d, MOD A4C: 57.5 ml LV vol s, MOD A2C: 23.2 ml LV vol s, MOD A4C: 28.6 ml LV SV MOD A2C:     25.8 ml LV SV MOD A4C:     57.5 ml LV SV MOD BP:      29.0 ml RIGHT VENTRICLE RV Basal diam:  2.90 cm RV Mid diam:    2.40 cm RV S prime:     11.30 cm/s TAPSE (M-mode): 1.5 cm LEFT ATRIUM             Index        RIGHT ATRIUM           Index LA diam:        4.10 cm  2.25 cm/m   RA Area:     11.60 cm LA Vol (A2C):   35.7 ml 19.63 ml/m  RA Volume:   25.00 ml  13.74 ml/m LA Vol (A4C):   56.3 ml 30.95 ml/m LA Biplane Vol: 45.9 ml 25.24 ml/m  AORTIC VALVE                    PULMONIC VALVE AV Area (Vmax):    2.77 cm     PV Vmax:          0.72 m/s AV Area (Vmean):   2.74 cm     PV Peak grad:     2.1 mmHg AV Area (VTI):     2.83 cm     PR End Diast Vel: 5.57 msec AV Vmax:           72.40 cm/s AV Vmean:  52.450 cm/s AV VTI:            0.119 m AV Peak Grad:      2.1 mmHg AV Mean Grad:      1.2 mmHg LVOT Vmax:         63.92 cm/s LVOT Vmean:        45.680 cm/s LVOT VTI:          0.107 m LVOT/AV VTI ratio: 0.90 AI PHT:            579 msec  AORTA Ao Root diam: 3.60 cm Ao Asc diam:  3.20 cm MITRAL VALVE               TRICUSPID VALVE MV Area (PHT): 3.39 cm    TR Peak grad:   21.7 mmHg MV Decel Time: 224 msec    TR Vmax:        233.00 cm/s MR Peak grad: 28.3 mmHg MR Vmax:      266.00 cm/s  SHUNTS MV E velocity: 78.00 cm/s  Systemic VTI:  0.11 m MV A velocity: 20.10 cm/s  Systemic Diam: 2.00 cm MV E/A ratio:  3.88 Oswaldo Milian MD Electronically signed by Oswaldo Milian MD Signature Date/Time: 10/29/2022/3:45:51 PM    Final    VAS US CAROTID  Result Date: 10/29/2022 Carotid Arterial Duplex Study Patient Name:  DOMINION KATHAN Seaman  Date of Exam:   10/29/2022 Medical Rec #: [id number]        Accession #:    [id number] Date of Birth: [birth year]       Patient Gender: M Patient Age:   62 years Exam Location:  [facility] Procedure:      VAS US CAROTID Referring Phys: Alferd Patee [facility] --------------------------------------------------------------------------------  Indications:       CVA. Risk Factors:      Hypertension, hyperlipidemia, Diabetes. Comparison Study:  No prior studies. Performing Technologist: Oliver Hum RVT  Examination Guidelines: A complete evaluation includes B-mode imaging, spectral Doppler, color Doppler, and power Doppler as needed of all  accessible portions of each vessel. Bilateral testing is considered an integral part of a complete examination. Limited examinations for reoccurring indications may be performed as noted.  Right Carotid Findings: +----------+--------+--------+--------+-----------------------+--------+           PSV cm/sEDV cm/sStenosisPlaque Description     Comments +----------+--------+--------+--------+-----------------------+--------+ CCA Prox  83      14              smooth and heterogenous         +----------+--------+--------+--------+-----------------------+--------+ CCA Distal65      17              smooth and heterogenous         +----------+--------+--------+--------+-----------------------+--------+ ICA Prox  25      7               heterogenous and smooth         +----------+--------+--------+--------+-----------------------+--------+ ICA Distal42      12                                     tortuous +----------+--------+--------+--------+-----------------------+--------+ ECA       92      1                                               +----------+--------+--------+--------+-----------------------+--------+ +----------+--------+-------+--------+-------------------+  PSV cm/sEDV cmsDescribeArm Pressure (mmHG) +----------+--------+-------+--------+-------------------+ Subclavian89                                         +----------+--------+-------+--------+-------------------+ +---------+--------+--+--------+-+---------+ VertebralPSV cm/s24EDV cm/s5Antegrade +---------+--------+--+--------+-+---------+  Left Carotid Findings: +----------+--------+--------+--------+-----------------------+--------+           PSV cm/sEDV cm/sStenosisPlaque Description     Comments +----------+--------+--------+--------+-----------------------+--------+ CCA Prox  94      14              smooth and heterogenous          +----------+--------+--------+--------+-----------------------+--------+ CCA Distal72      11              smooth and heterogenous         +----------+--------+--------+--------+-----------------------+--------+ ICA Prox  33      12              smooth and heterogenous         +----------+--------+--------+--------+-----------------------+--------+ ICA Distal37      18                                     tortuous +----------+--------+--------+--------+-----------------------+--------+ ECA       61      7                                               +----------+--------+--------+--------+-----------------------+--------+ +----------+--------+--------+--------+-------------------+           PSV cm/sEDV cm/sDescribeArm Pressure (mmHG) +----------+--------+--------+--------+-------------------+ Subclavian110                                         +----------+--------+--------+--------+-------------------+ +---------+--------+--+--------+--+---------+ VertebralPSV cm/s25EDV cm/s10Antegrade +---------+--------+--+--------+--+---------+   Summary: Right Carotid: Velocities in the right ICA are consistent with a 1-39% stenosis. Left Carotid: Velocities in the left ICA are consistent with a 1-39% stenosis. Vertebrals: Bilateral vertebral arteries demonstrate antegrade flow. *See table(s) above for measurements and observations.     Preliminary        LOS: 3 days   Desjuan Stearns [employer] on [email or web address]  10/31/2022, 10:11 AM

## 2022-10-31 NOTE — TOC Initial Note (Signed)
Transition of Care [facility]) - Initial/Assessment Note    Patient Details  Name: Glen Martinez MRN: [id number] Date of Birth: 06-29-1931  Transition of [facility]) CM/SW Contact:    Geralynn Ochs, LCSW Phone Number: 10/31/2022, 4:11 PM  Clinical Narrative:      CSW updated by RN earlier today that patient agreeable to SNF, preference for [place]. CSW met with patient and sister at bedside to discuss options for SNF and provide CMS choice list. Family to review options and determine SNF choice. CSW to follow.             Expected Discharge Plan: Skilled Nursing Facility Barriers to Discharge: Continued Medical Work up, Ship broker   Patient Goals and CMS Choice Patient states their goals for this hospitalization and ongoing recovery are:: to get rehab CMS Medicare.gov Compare Post Acute Care list provided to:: Patient Choice offered to / list presented to : Patient, Sibling [place] ownership interest in [facility].provided to:: Patient    Expected Discharge Plan and Services     Post Acute Care Choice: [place] Living arrangements for the past 2 months: Single Family Home                                      Prior Living Arrangements/Services Living arrangements for the past 2 months: Single Family Home Lives with:: Self Patient language and need for interpreter reviewed:: No Do you feel safe going back to the place where you live?: Yes      Need for Family Participation in Patient Care: Yes (Comment) Care giver support system in place?: No (comment)   Criminal Activity/Legal Involvement Pertinent to Current Situation/Hospitalization: No - Comment as needed  Activities of Daily Living      Permission Sought/Granted Permission sought to share information with : Facility Sport and exercise psychologist, Family Supports Permission granted to share information with : Yes, Verbal Permission Granted  Share Information with  NAME: Mechele Claude  Permission granted to share info w AGENCY: SNF  Permission granted to share info w Relationship: Sister     Emotional Assessment Appearance:: Appears stated age Attitude/Demeanor/Rapport: Engaged Affect (typically observed): Appropriate Orientation: : Oriented to Self, Oriented to Place, Oriented to Situation Alcohol / Substance Use: Not Applicable Psych Involvement: No (comment)  Admission diagnosis:  Acute ischemic right MCA stroke ([place]) [I63.511] Acute CVA (cerebrovascular accident) [facility]) [I63.9] Patient Active Problem List   Diagnosis Date Noted   Acute ischemic right MCA stroke ([place]) 10/13/2022   Acute renal failure superimposed on stage 3a chronic kidney disease ([place]) 11/02/2022   Leukocytosis 10/27/2022   Elevated LFTs 10/18/2022   OSA (obstructive sleep apnea) 10/20/2022   Paroxysmal atrial fibrillation ([place] 08/10/2021   Secondary hypercoagulable state ([place]) 08/10/2021   Hemorrhagic stroke ([place]) 07/10/2021   Abnormal ankle brachial index (ABI) 07/03/2019   Benign prostatic hyperplasia 04/09/2018   Hyperlipidemia associated with type 2 diabetes mellitus ([place]) 04/09/2018   Hypertension associated with diabetes ([place]) 04/09/2018   Hypothyroidism 04/09/2018   Personal history of gout 07/18/2016   Type 2 diabetes mellitus with stage 3 chronic kidney disease, with long-term current use of insulin ([place]) 11/20/2015   ED (erectile dysfunction) of organic origin 08/01/2012   Elevated prostate specific antigen (PSA) 08/01/2012   Nodular prostate with urinary obstruction 08/01/2012   Incomplete emptying of bladder 08/01/2012   Chronic prostatitis 08/01/2012   Disorder of male genital organs  08/01/2012   Urinary tract infection 08/01/2012   PCP:  Tracie Harrier, MD Pharmacy:   [place], [place] - [address] [place] [address] Phone: [phone] Fax: [phone]  [id number] [id number]: [phone]: [phone]    Social Determinants of Health (SDOH) Social History: SDOH Screenings   Tobacco Use: Low Risk  (09/22/2022)   SDOH Interventions:     Readmission Risk Interventions     No data to display

## 2022-10-31 NOTE — Progress Notes (Signed)
RT went to place patient on CPAP.  Patient not ready. RN will call when patient is ready.

## 2022-10-31 NOTE — Progress Notes (Signed)
Neurology Progress Note   S:// I was called emergently to evaluate the patient at bedside after he had an episode of syncope. The patient was presumably taken to the bathroom and while on the toilet had a syncopal episode and after that was unable to be woken up. He remained unresponsive, was having labored breathing and Cheyne-Stokes breathing.  Upon trying to clear his airway, he had no cough or gag. He also had gaze deviation to the left which I also witnessed which was transient. He later spontaneously started coughing. He was lethargic this morning but had received Ativan earlier according to the note. He had received p.o. Seroquel 30 to 40 minutes prior to this change in his mentation.  No benzos given this evening.  No other sedating medications given this evening. No clear last known well although he was able to get to the bathroom but has been lethargic since this morning maybe last night-not in the window for any intervention given unclear last known well, recent stroke with hemorrhagic transformation.  O:// Current vital signs: BP (!) 157/99 (BP Location: Right Arm)   Pulse (!) 138   Temp 98.2 F (36.8 C) (Axillary)   Resp 20   Ht '5\' 6"'$  (1.676 m)   Wt 72.6 kg   SpO2 94%   BMI 25.82 kg/m  Vital signs in last 24 hours: Temp:  [97.9 F (36.6 C)-99 F (37.2 C)] 98.2 F (36.8 C) (01/22 2318) Pulse Rate:  [94-138] 138 (01/22 2217) Resp:  [14-20] 20 (01/22 2318) BP: (112-169)/(65-122) 157/99 (01/22 2318) SpO2:  [94 %-100 %] 94 % (01/22 2318) General: Obtunded, no acute distress HEENT: Normocephalic/atraumatic Lungs:Clear Cardiovascular: Irregularly irregular Neurological exam Obtunded, does not respond to voice No spontaneous movement initially but after suctioning, started having coughing and started to move lower extremities somewhat. Nonverbal Cranial nerves: Pupils are equal round reactive to light, initially had a leftward gaze preference that resolved and gaze was  then midline, did not been to threat from either side, face with left lower facial weakness.  Motor examination: To sternal rub, localizes with both upper extremities.  Minimal withdrawal to noxious stimulation in both lower extremities Sensation: As above Coordination cannot be assessed given his mentation  Medications  Current Facility-Administered Medications:    [DISCONTINUED] acetaminophen (TYLENOL) tablet 650 mg, 650 mg, Oral, Q4H PRN **OR** [DISCONTINUED] acetaminophen (TYLENOL) 160 MG/5ML solution 650 mg, 650 mg, Per Tube, Q4H PRN **OR** acetaminophen (TYLENOL) suppository 650 mg, 650 mg, Rectal, Q4H PRN, Zada Finders R, MD   aspirin tablet 325 mg, 325 mg, Oral, Daily, Bonnielee Haff, MD, 325 mg at 10/31/22 0852   heparin injection 5,000 Units, 5,000 Units, Subcutaneous, Q8H, Rosalin Hawking, MD, 5,000 Units at 10/31/22 2225   insulin aspart (novoLOG) injection 0-15 Units, 0-15 Units, Subcutaneous, Q4H, Lenore Cordia, MD, 5 Units at 10/31/22 2042   levothyroxine (SYNTHROID) tablet 88 mcg, 88 mcg, Oral, Q0600, Bonnielee Haff, MD, 88 mcg at 10/31/22 0532   metoprolol tartrate (LOPRESSOR) injection 2.5 mg, 2.5 mg, Intravenous, Q6H PRN, Bonnielee Haff, MD, 2.5 mg at 10/31/22 2219   metoprolol tartrate (LOPRESSOR) tablet 50 mg, 50 mg, Oral, BID, Bonnielee Haff, MD, 50 mg at 10/31/22 2222   QUEtiapine (SEROQUEL) tablet 25 mg, 25 mg, Oral, QHS, Bonnielee Haff, MD, 25 mg at 10/31/22 2222   rosuvastatin (CRESTOR) tablet 20 mg, 20 mg, Oral, Daily, Rosalin Hawking, MD, 20 mg at 10/31/22 4540   senna-docusate (Senokot-S) tablet 1 tablet, 1 tablet, Oral, QHS PRN, Lenore Cordia,  MD  Labs CBC    Component Value Date/Time   WBC 11.9 (H) 10/31/2022 0315   RBC 4.20 (L) 10/31/2022 0315   HGB 12.9 (L) 10/31/2022 0315   HCT 40.1 10/31/2022 0315   PLT 142 (L) 10/31/2022 0315   MCV 95.5 10/31/2022 0315   MCH 30.7 10/31/2022 0315   MCHC 32.2 10/31/2022 0315   RDW 13.8 10/31/2022 0315   LYMPHSABS  1.1 10/27/2022 1840   MONOABS 1.7 (H) 11/07/2022 1840   EOSABS 0.0 10/31/2022 1840   BASOSABS 0.0 11/05/2022 1840  CMP     Component Value Date/Time   NA 145 10/31/2022 0315   K 3.7 10/31/2022 0315   CL 111 10/31/2022 0315   CO2 27 10/31/2022 0315   GLUCOSE 106 (H) 10/31/2022 0315   BUN 25 (H) 10/31/2022 0315   CREATININE 1.44 (H) 10/31/2022 0315   CALCIUM 8.3 (L) 10/31/2022 0315   PROT 5.9 (L) 10/31/2022 0315   ALBUMIN 3.0 (L) 10/31/2022 0315   AST 86 (H) 10/31/2022 0315   ALT 57 (H) 10/31/2022 0315   ALKPHOS 69 10/31/2022 0315   BILITOT 0.4 10/31/2022 0315   GFRNONAA 46 (L) 10/31/2022 0315   GFRAA >60 01/01/2020 1537    Please see stroke labs and workup in the progress note from earlier this morning from Dr. Erlinda Hong  Imaging I have reviewed images in epic and the results pertinent to this consultation are: CT head 10/27/2022 with large right MCA infarct with small petechial hemorrhage. MRI brain from 10/18/2022 with large area of acute ischemia in the posterior right MCA territory, petechial hemorrhage within the infarcted territory location a: HI1, spoke tendons mild petechiae with no mass effect.  Old right PCA territory infarct.  Assessment: 89 year old history of hypertension hyperlipidemia diabetes A-fib on Coumadin admitted for left-sided weakness, left facial droop slurred speech and fall at home.  No tPA was given due to outside the window.  Imaging revealed a right MCA infarct large in size with petechial hemorrhage consistent with embolic pattern likely due to A-fib with subtherapeutic INR on Coumadin.  Currently only on an aspirin. A-fib with RVR being managed by medicine. Had a change in his mentation late night of 10/31/2022 where he had a syncopal episode in the bathroom, had to be lowered down and since then had been having Cheyne-Stokes breathing.  Exam with left gaze briefly that then spontaneously resolved. Differentials include worsening of the stroke size, increasing  hemorrhagic transformation, new stroke on the other side, vasovagal syncope and possible seizures/status epilepticus.  Unfortunately not a candidate for IV thrombolysis due to the recent stroke and hemorrhagic transformation.   Recommendations: Critical care team has been consulted for airway management.  May need intubation. Will defer to the critical care team I need for him to have a stat head CT as well as to obtain some basic labs CBC, CMP and also check an ABG. Given the he has a right MCA stroke and the gaze deviation was to the left, seizures are in the realm of differentials and I would obtain an EEG. I would also give him a load of Keppra 1 g IV x 1. Avoiding benzodiazepines so that they do not depress his mental status as there was some question about it in the morning. Neurology will continue to follow.  ADDENDUM CTH with no acute changes. No significant acute process. Redemonstrated RMCA stroke that is evolving, with HT, which also is not worse than prior scan from 1/19.  There is also concern  of hyperdense left ICA terminus on the CT, which warrants repeat CT angiography head and neck.  I will order the stat CT angio head and neck and investigate further.  Plan was discussed with Drs. Trish Mage on secure chat. Patient's bedside RN has been notified of the order via phone and will coordinate with respiratory therapy to transport for CTA.   -- Amie Portland, MD Neurologist [facility] Pager: [phone]  CRITICAL CARE ATTESTATION Performed by: Amie Portland, MD Total critical care time: 35 minutes Critical care time was exclusive of separately billable procedures and treating other patients and/or supervising APPs/Residents/Students Critical care was necessary to treat or prevent imminent or life-threatening deterioration. This patient is critically ill and at significant risk for neurological worsening and/or death and care requires constant monitoring.  Critical care was time spent personally by me on the following activities: development of treatment plan with patient and/or surrogate as well as nursing, discussions with consultants, evaluation of patient's response to treatment, examination of patient, obtaining history from patient or surrogate, ordering and performing treatments and interventions, ordering and review of laboratory studies, ordering and review of radiographic studies, pulse oximetry, re-evaluation of patient's condition, participation in multidisciplinary rounds and medical decision making of high complexity in the care of this patient.

## 2022-10-31 NOTE — Progress Notes (Addendum)
TRH night coverage note:  Pt with witnessed syncope while having BM on toilet.  No fall, didn't hit head per staff members (RN and NT) who were present at time.  Recent meds given = Seroquel, metoprolol.  Did NOT receive any ativan nor haldol recently per RN.  Pt assisted to ground before being moved back to bed.  HR 130-150 A.Fib, pulse ox 92-98% on RA, BP 283 systolic.  RR variable with Cheyne-Stokes breathing.  No gag reflex.  Localizing to noxious stimuli only, no verbal, no eye opening.  Initially had pinpoint pupils and eye deviation to the RIGHT side on exam.  Neurology and PCCM at bedside:  Needs CT head per neuro.  PCCM intubating patient for airway protection due to persistent AMS.  CRITICAL CARE Performed by: Etta Quill.   Total critical care time: 30 minutes  Critical care time was exclusive of separately billable procedures and treating other patients.  Critical care was necessary to treat or prevent imminent or life-threatening deterioration.  Critical care was time spent personally by me on the following activities: development of treatment plan with patient and/or surrogate as well as nursing, discussions with consultants, evaluation of patient's response to treatment, examination of patient, obtaining history from patient or surrogate, ordering and performing treatments and interventions, ordering and review of laboratory studies, ordering and review of radiographic studies, pulse oximetry and re-evaluation of patient's condition.

## 2022-10-31 NOTE — Evaluation (Signed)
Modified Barium Swallow Progress Note  Patient Details  Name: Glen Martinez MRN: [id number] Date of Birth: 1931/03/31  Today's Date: 10/31/2022  Modified Barium Swallow completed.  Full report located under Chart Review in the Imaging Section.  Brief recommendations include the following:  Clinical Impression  Pt presents with oropharyngeal dysphagia post CVA, marked by impaired mastication, poor bolus control, premature loss of liquids, reduced epiglottic inversion, reduced anterior hyoid excursion and delayed swallow initation, resulting in sensed aspiration of thin liquids (PAS 7). Chin tuck and smaller volumes not effective in eliminating aspiration with this consistency. NTL by cup/straw resulted in x1 occurance of penetration below the cords, with material fully ejected spontaneously from airway with throat clear (PAS 6). Of note, poor BOT retraction and pharyngeal stripping resulted in min-mod residuals in vallecula and pyriform sinuses across trials, increasing with thicker liquid and regular solid consistencies. With a teaspoon of HTL, silent aspiration (PAS 8) after the swallow occured when pyriform residuals spilled into airway posteriorly. Effortful swallows, repeat dry swallows and NTL wash assisted with clearance of majority of oropharyngeal residuals across trials. Recommend continue dys 1 diet with NTL (cup or straw ok) with adherence to swallow precautions as indicated. Will f/u for tolerance.   Swallow Evaluation Recommendations       SLP Diet Recommendations: Dysphagia 1 (Puree) solids;Nectar thick liquid   Liquid Administration via: Cup;Straw   Medication Administration: Crushed with puree   Supervision: Full assist for feeding;Staff to assist with self feeding;Full supervision/cueing for compensatory strategies   Compensations: Minimize environmental distractions;Slow rate;Small sips/bites;Lingual sweep for clearance of pocketing;Monitor for anterior loss;Multiple dry  swallows after each bite/sip;Follow solids with liquid;Clear throat intermittently;Effortful swallow   Postural Changes: Seated upright at 90 degrees   Oral Care Recommendations: Oral care BID   Other Recommendations: Order thickener from [place], [place], [place] Office Number: [place] 10/31/2022,11:08 AM

## 2022-10-31 NOTE — Progress Notes (Signed)
Pt asked to use the bathroom at 2300, assisted with a stedy to the bathroom, had a BM and suddenly became unresponsive, pt assisted back to bed, rapid respond RN and Dr Alcario Drought at bedside, pt has a good pulse, still Afib on the monitor with a HR in the 130s-140s, On call Neurology (Dr Rory Percy) also at bedside, PCCM consulted, will continue to monitor. [place], Shaquia Berkley Efe

## 2022-10-31 NOTE — Progress Notes (Signed)
Patient placed on nasal CPAP and tolerating well at this time.  

## 2022-10-31 NOTE — Progress Notes (Signed)
   10/30/22 2334  Assess: MEWS Score  Temp 99 F (37.2 C)  BP (!) 171/103  MAP (mmHg) 122  ECG Heart Rate (!) 119  Resp 16  Level of Consciousness Alert  SpO2 100 %  O2 Device CPAP  Assess: MEWS Score  MEWS Temp 0  MEWS Systolic 0  MEWS Pulse 2  MEWS RR 0  MEWS LOC 0  MEWS Score 2  MEWS Score Color Yellow  Assess: if the MEWS score is Yellow or Red  Were vital signs taken at a resting state? Yes  Focused Assessment No change from prior assessment  Does the patient meet 2 or more of the SIRS criteria? No  Does the patient have a confirmed or suspected source of infection? No  Provider and Rapid Response Notified? No  Treat  MEWS Interventions Administered prn meds/treatments  Pain Scale 0-10  Pain Score 0  Take Vital Signs  Increase Vital Sign Frequency  Yellow: Q 2hr X 2 then Q 4hr X 2, if remains yellow, continue Q 4hrs  Escalate  MEWS: Escalate Yellow: discuss with charge nurse/RN and consider discussing with provider and RRT  Notify: Charge Nurse/RN  Name of Charge Nurse/RN Notified Devonn Giampietro, RN  Date Charge Nurse/RN Notified 10/30/22  Time Charge Nurse/RN Notified 2334  Document  Patient Outcome Stabilized after interventions  Progress note created (see row info) Yes  Assess: SIRS CRITERIA  SIRS Temperature  0  SIRS Pulse 1  SIRS Respirations  0  SIRS WBC 0  SIRS Score Sum  1

## 2022-10-31 NOTE — Progress Notes (Signed)
Occupational Therapy Treatment Patient Details Name: Glen Martinez MRN: [id number] DOB: [birth year] Today's Date: 10/31/2022   History of present illness Pt is an 89 y.o. male presenting via EMS 1/19 with facial droop, weakness, and slurred speech following a fall. MRI revealed large area of acute ischemia within the R MCA territory with petechial hemorrhage wuthin the infarcted territory. PMH significant for R PCA infarct, DM2, GERD, HLD, HTN, hypothyroidism, gout.   OT comments  Glen Martinez is making incremental progress. Upon arrival, pt noted to have pocket of food/liquid in L cheek. Cues given for swallowing techniques listed on pt's wall. Overall he needed mod A +2 for bed mobility and up to min A for sitting balance to correct L lateral lean and posterior LOB. Pt was able to stand and take pivotal steps from bed>chair with max A +2 and maximal multimodal cues. Pt demonstrates better AROM of proximal LUE than distal, he may benefit from prefab wrist cock up next session if he continues to have limited AROM. POC remains appropriate.    Recommendations for follow up therapy are one component of a multi-disciplinary discharge planning process, led by the attending physician.  Recommendations may be updated based on patient status, additional functional criteria and insurance authorization.    Follow Up Recommendations  Skilled nursing-short term rehab (<3 hours/day)     Assistance Recommended at Discharge Frequent or constant Supervision/Assistance  Patient can return home with the following  Two people to help with walking and/or transfers;A lot of help with bathing/dressing/bathroom;Assistance with cooking/housework;Assistance with feeding;Direct supervision/assist for medications management;Direct supervision/assist for financial management;Assist for transportation;Help with stairs or ramp for entrance   Equipment Recommendations  Other (comment)       Precautions / Restrictions  Precautions Precautions: Fall Restrictions Weight Bearing Restrictions: No       Mobility Bed Mobility Overal bed mobility: Needs Assistance Bed Mobility: Supine to Sit     Supine to sit: Mod assist, +2 for physical assistance, +2 for safety/equipment          Transfers Overall transfer level: Needs assistance Equipment used: Rolling walker (2 wheels) Transfers: Sit to/from Stand, Bed to chair/wheelchair/BSC Sit to Stand: Mod assist, +2 physical assistance, +2 safety/equipment     Step pivot transfers: Max assist, +2 physical assistance, +2 safety/equipment     General transfer comment: heavy L lateral lean     Balance Overall balance assessment: Needs assistance Sitting-balance support: No upper extremity supported, Feet supported Sitting balance-Leahy Scale: Poor     Standing balance support: During functional activity, Bilateral upper extremity supported Standing balance-Leahy Scale: Poor                             ADL either performed or assessed with clinical judgement   ADL Overall ADL's : Needs assistance/impaired     Grooming: Moderate assistance;Sitting Grooming Details (indicate cue type and reason): for sitting balance and hadn over hand with LUE                 Toilet Transfer: Maximal assistance;+2 for physical assistance;+2 for safety/equipment;Rolling walker (2 wheels) Toilet Transfer Details (indicate cue type and reason): simulated bed>chair Toileting- Clothing Manipulation and Hygiene: +2 for physical assistance;Maximal assistance;+2 for safety/equipment;Sit to/from stand Toileting - Clothing Manipulation Details (indicate cue type and reason): rear peri care in standing     Functional mobility during ADLs: Maximal assistance;+2 for physical assistance;+2 for safety/equipment General ADL Comments: L hemi and L  inattention, maximal cues needed for initiation and sequencing of all tasks    Extremity/Trunk Assessment Upper  Extremity Assessment Upper Extremity Assessment: LUE deficits/detail LUE Deficits / Details: shoulder flexion 2+/5; no AROM at hand. PROM WFL. Pt with sensation at shoulder level, but unable to detect touch below elbow. LUE Sensation: decreased light touch LUE Coordination: decreased fine motor;decreased gross motor   Lower Extremity Assessment Lower Extremity Assessment: Defer to PT evaluation        Vision   Vision Assessment?: Yes;Vision impaired- to be further tested in functional context Eye Alignment: Impaired (comment) Tracking/Visual Pursuits: Impaired - to be further tested in functional context Additional Comments: L inattention   Perception Perception Perception: Not tested   Praxis Praxis Praxis: Not tested    Cognition Arousal/Alertness: Awake/alert Behavior During Therapy: Flat affect Overall Cognitive Status: No family/caregiver present to determine baseline cognitive functioning                                 General Comments: oriented. limited self awareness, flat throughout, requires maximal cues for initiation and sequencing for all tasks. L inattention              General Comments VSS, pt seemingly with pocket of food in L cheek upon arrival    Pertinent Vitals/ Pain       Pain Assessment Pain Assessment: Faces Faces Pain Scale: No hurt Pain Intervention(s): Monitored during session   Frequency  Min 2X/week        Progress Toward Goals  OT Goals(current goals can now be found in the care plan section)  Progress towards OT goals: Progressing toward goals  Acute Rehab OT Goals OT Goal Formulation: With patient Time For Goal Achievement: 11/13/22 Potential to Achieve Goals: Good ADL Goals Pt Will Perform Grooming: with min assist;sitting Pt Will Perform Upper Body Dressing: with supervision;sitting Pt Will Perform Lower Body Dressing: with min assist;sit to/from stand Pt Will Transfer to Toilet: with min  assist;ambulating;bedside commode Pt/caregiver will Perform Home Exercise Program: Increased strength;Increased ROM;Left upper extremity;With written HEP provided;With Supervision  Plan Discharge plan remains appropriate       AM-PAC OT "6 Clicks" Daily Activity     Outcome Measure   Help from another person eating meals?: A Little Help from another person taking care of personal grooming?: A Lot Help from another person toileting, which includes using toliet, bedpan, or urinal?: A Lot Help from another person bathing (including washing, rinsing, drying)?: A Lot Help from another person to put on and taking off regular upper body clothing?: A Lot Help from another person to put on and taking off regular lower body clothing?: A Lot 6 Click Score: 13    End of Session Equipment Utilized During Treatment: Gait belt;Rolling walker (2 wheels)  OT Visit Diagnosis: Unsteadiness on feet (R26.81);Muscle weakness (generalized) (M62.81);Other abnormalities of gait and mobility (R26.89);Low vision, both eyes (H54.2);Other symptoms and signs involving cognitive function;Cognitive communication deficit (R41.841);Hemiplegia and hemiparesis Symptoms and signs involving cognitive functions: Cerebral infarction Hemiplegia - Right/Left: Left Hemiplegia - dominant/non-dominant: Non-Dominant Hemiplegia - caused by: Cerebral infarction   Activity Tolerance Patient tolerated treatment well   Patient Left in chair;with call bell/phone within reach;with chair alarm set   Nurse Communication Mobility status        Time: 6314-9702 OT Time Calculation (min): 37 min  Charges: OT General Charges $OT Visit: 1 Visit OT Treatments $Neuromuscular Re-education: 23-37 mins  Elliot Cousin 10/31/2022, 2:39 PM

## 2022-10-31 NOTE — TOC Benefit Eligibility Note (Signed)
Patient Teacher, English as a foreign language completed.    The patient is currently admitted and upon discharge could be taking Eliquis 5 mg.  The current 30 day co-pay is $45.00.   The patient is insured through [place] [place] Patient Advocate Specialist [place] Patient Advocate Team Direct Number: [phone]  Fax: [phone]

## 2022-11-01 ENCOUNTER — Other Ambulatory Visit (HOSPITAL_COMMUNITY): Payer: Medicare HMO

## 2022-11-01 ENCOUNTER — Inpatient Hospital Stay (HOSPITAL_COMMUNITY): Payer: Medicare HMO

## 2022-11-01 DIAGNOSIS — J9600 Acute respiratory failure, unspecified whether with hypoxia or hypercapnia: Secondary | ICD-10-CM

## 2022-11-01 DIAGNOSIS — R55 Syncope and collapse: Secondary | ICD-10-CM | POA: Diagnosis not present

## 2022-11-01 DIAGNOSIS — N179 Acute kidney failure, unspecified: Secondary | ICD-10-CM

## 2022-11-01 DIAGNOSIS — I63511 Cerebral infarction due to unspecified occlusion or stenosis of right middle cerebral artery: Secondary | ICD-10-CM | POA: Diagnosis not present

## 2022-11-01 LAB — CBC
HCT: 41.9 % (ref 39.0–52.0)
Hemoglobin: 13.5 g/dL (ref 13.0–17.0)
MCH: 31 pg (ref 26.0–34.0)
MCHC: 32.2 g/dL (ref 30.0–36.0)
MCV: 96.1 fL (ref 80.0–100.0)
Platelets: 128 10*3/uL — ABNORMAL LOW (ref 150–400)
RBC: 4.36 MIL/uL (ref 4.22–5.81)
RDW: 13.8 % (ref 11.5–15.5)
WBC: 10.5 10*3/uL (ref 4.0–10.5)
nRBC: 0 % (ref 0.0–0.2)

## 2022-11-01 LAB — POCT I-STAT 7, (LYTES, BLD GAS, ICA,H+H)
Acid-base deficit: 2 mmol/L (ref 0.0–2.0)
Bicarbonate: 23.6 mmol/L (ref 20.0–28.0)
Calcium, Ion: 1.21 mmol/L (ref 1.15–1.40)
HCT: 37 % — ABNORMAL LOW (ref 39.0–52.0)
Hemoglobin: 12.6 g/dL — ABNORMAL LOW (ref 13.0–17.0)
O2 Saturation: 100 %
Patient temperature: 98
Potassium: 3.6 mmol/L (ref 3.5–5.1)
Sodium: 147 mmol/L — ABNORMAL HIGH (ref 135–145)
TCO2: 25 mmol/L (ref 22–32)
pCO2 arterial: 42.2 mmHg (ref 32–48)
pH, Arterial: 7.353 (ref 7.35–7.45)
pO2, Arterial: 466 mmHg — ABNORMAL HIGH (ref 83–108)

## 2022-11-01 LAB — GLUCOSE, CAPILLARY
Glucose-Capillary: 98 mg/dL (ref 70–99)
Glucose-Capillary: 110 mg/dL — ABNORMAL HIGH (ref 70–99)

## 2022-11-01 LAB — COMPREHENSIVE METABOLIC PANEL
ALT: 50 U/L — ABNORMAL HIGH (ref 0–44)
AST: 61 U/L — ABNORMAL HIGH (ref 15–41)
Albumin: 3.1 g/dL — ABNORMAL LOW (ref 3.5–5.0)
Alkaline Phosphatase: 73 U/L (ref 38–126)
Anion gap: 10 (ref 5–15)
BUN: 26 mg/dL — ABNORMAL HIGH (ref 8–23)
CO2: 21 mmol/L — ABNORMAL LOW (ref 22–32)
Calcium: 8.3 mg/dL — ABNORMAL LOW (ref 8.9–10.3)
Chloride: 113 mmol/L — ABNORMAL HIGH (ref 98–111)
Creatinine, Ser: 1.6 mg/dL — ABNORMAL HIGH (ref 0.61–1.24)
GFR, Estimated: 41 mL/min — ABNORMAL LOW (ref >60)
Glucose, Bld: 118 mg/dL — ABNORMAL HIGH (ref 70–99)
Potassium: 3.9 mmol/L (ref 3.5–5.1)
Sodium: 144 mmol/L (ref 135–145)
Total Bilirubin: 0.7 mg/dL (ref 0.3–1.2)
Total Protein: 6.1 g/dL — ABNORMAL LOW (ref 6.5–8.1)

## 2022-11-01 LAB — MRSA NEXT GEN BY PCR, NASAL: MRSA by PCR Next Gen: NOT DETECTED

## 2022-11-01 LAB — LDL CHOLESTEROL, DIRECT: Direct LDL: 79 mg/dL (ref 0–99)

## 2022-11-01 MED ORDER — SODIUM CHLORIDE 0.9 % IV SOLN: Freq: Once | INTRAVENOUS | Status: AC

## 2022-11-01 MED ORDER — MORPHINE SULFATE (PF) 4 MG/ML IV SOLN: 4.0000 mg | Freq: Once | INTRAVENOUS | Status: AC

## 2022-11-01 MED ORDER — GLYCOPYRROLATE 0.2 MG/ML IJ SOLN: 0.2000 mg | INTRAMUSCULAR | Status: DC | PRN

## 2022-11-01 MED ORDER — LEVOTHYROXINE SODIUM 88 MCG PO TABS: 88.0000 ug | ORAL_TABLET | Freq: Every day | ORAL | Status: DC

## 2022-11-01 MED ORDER — ACETAMINOPHEN 650 MG RE SUPP: 650.0000 mg | Freq: Four times a day (QID) | RECTAL | Status: DC | PRN

## 2022-11-01 MED ORDER — MIDAZOLAM HCL 2 MG/2ML IJ SOLN
2.0000 mg | Freq: Once | INTRAMUSCULAR | Status: AC
  Administered 2022-11-01: 2 mg via INTRAVENOUS

## 2022-11-01 MED ORDER — FENTANYL CITRATE PF 50 MCG/ML IJ SOSY
25.0000 ug | PREFILLED_SYRINGE | INTRAMUSCULAR | Status: DC | PRN
  Administered 2022-11-01: 50 ug via INTRAVENOUS
  Filled 2022-11-01: qty 1

## 2022-11-01 MED ORDER — GLYCOPYRROLATE 0.2 MG/ML IJ SOLN
0.2000 mg | INTRAMUSCULAR | Status: DC | PRN
  Administered 2022-11-01: 0.2 mg via INTRAVENOUS
  Filled 2022-11-01: qty 1

## 2022-11-01 MED ORDER — ACETAMINOPHEN 325 MG PO TABS: 650.0000 mg | ORAL_TABLET | Freq: Four times a day (QID) | ORAL | Status: DC | PRN

## 2022-11-01 MED ORDER — MORPHINE SULFATE (PF) 2 MG/ML IV SOLN
2.0000 mg | INTRAVENOUS | Status: DC | PRN
  Administered 2022-11-01 (×2): 2 mg via INTRAVENOUS
  Filled 2022-11-01: qty 1
  Filled 2022-11-01: qty 2
  Filled 2022-11-01: qty 1

## 2022-11-01 MED ORDER — CHLORHEXIDINE GLUCONATE CLOTH 2 % EX PADS
6.0000 | MEDICATED_PAD | Freq: Every day | CUTANEOUS | Status: DC
  Administered 2022-11-01: 6 via TOPICAL

## 2022-11-01 MED ORDER — GLYCOPYRROLATE 1 MG PO TABS: 1.0000 mg | ORAL_TABLET | ORAL | Status: DC | PRN

## 2022-11-01 MED ORDER — QUETIAPINE FUMARATE 25 MG PO TABS: 25.0000 mg | ORAL_TABLET | Freq: Every day | ORAL | Status: DC

## 2022-11-01 MED ORDER — SODIUM CHLORIDE 0.9 % IV SOLN: INTRAVENOUS | Status: DC

## 2022-11-01 MED ORDER — FENTANYL CITRATE PF 50 MCG/ML IJ SOSY
50.0000 ug | PREFILLED_SYRINGE | Freq: Once | INTRAMUSCULAR | Status: AC
  Administered 2022-11-01: 50 ug via INTRAVENOUS

## 2022-11-01 MED ORDER — DOCUSATE SODIUM 50 MG/5ML PO LIQD
100.0000 mg | Freq: Two times a day (BID) | ORAL | Status: DC
  Administered 2022-11-01: 100 mg
  Filled 2022-11-01 (×2): qty 10

## 2022-11-01 MED ORDER — METOPROLOL TARTRATE 50 MG PO TABS
50.0000 mg | ORAL_TABLET | Freq: Two times a day (BID) | ORAL | Status: DC
  Administered 2022-11-01: 50 mg
  Filled 2022-11-01: qty 1

## 2022-11-01 MED ORDER — MIDAZOLAM HCL 2 MG/2ML IJ SOLN
INTRAMUSCULAR | Status: AC
  Filled 2022-11-01: qty 2

## 2022-11-01 MED ORDER — FENTANYL CITRATE PF 50 MCG/ML IJ SOSY: 50.0000 ug | PREFILLED_SYRINGE | Freq: Once | INTRAMUSCULAR | Status: AC

## 2022-11-01 MED ORDER — POLYETHYLENE GLYCOL 3350 17 G PO PACK
17.0000 g | PACK | Freq: Every day | ORAL | Status: DC
  Administered 2022-11-01: 17 g
  Filled 2022-11-01: qty 1

## 2022-11-01 MED ORDER — FENTANYL CITRATE PF 50 MCG/ML IJ SOSY: 25.0000 ug | PREFILLED_SYRINGE | INTRAMUSCULAR | Status: DC | PRN

## 2022-11-01 MED ORDER — ROSUVASTATIN CALCIUM 20 MG PO TABS
20.0000 mg | ORAL_TABLET | Freq: Every day | ORAL | Status: DC
  Administered 2022-11-01: 20 mg
  Filled 2022-11-01: qty 1

## 2022-11-01 MED ORDER — LACTATED RINGERS IV SOLN: INTRAVENOUS | Status: DC

## 2022-11-01 MED ORDER — PROPOFOL 1000 MG/100ML IV EMUL
0.0000 ug/kg/min | INTRAVENOUS | Status: DC
  Administered 2022-11-01: 5 ug/kg/min via INTRAVENOUS
  Administered 2022-11-01: 25 ug/kg/min via INTRAVENOUS
  Filled 2022-11-01 (×2): qty 100

## 2022-11-01 MED ORDER — ASPIRIN 325 MG PO TABS
325.0000 mg | ORAL_TABLET | Freq: Every day | ORAL | Status: DC
  Administered 2022-11-01: 325 mg
  Filled 2022-11-01: qty 1

## 2022-11-01 MED ORDER — SENNOSIDES-DOCUSATE SODIUM 8.6-50 MG PO TABS: 1.0000 | ORAL_TABLET | Freq: Every evening | ORAL | Status: DC | PRN

## 2022-11-01 MED ORDER — SODIUM CHLORIDE 0.9 % IV SOLN: INTRAVENOUS | Status: DC | PRN

## 2022-11-01 MED ORDER — PHENYLEPHRINE 80 MCG/ML (10ML) SYRINGE FOR IV PUSH (FOR BLOOD PRESSURE SUPPORT)
80.0000 ug | PREFILLED_SYRINGE | Freq: Once | INTRAVENOUS | Status: AC | PRN
  Administered 2022-11-01: 200 ug via INTRAVENOUS

## 2022-11-01 MED ORDER — ROCURONIUM BROMIDE 50 MG/5ML IV SOLN
50.0000 mg | Freq: Once | INTRAVENOUS | Status: AC
  Filled 2022-11-01: qty 5

## 2022-11-01 MED ORDER — ORAL CARE MOUTH RINSE
15.0000 mL | OROMUCOSAL | Status: DC
  Administered 2022-11-01 (×9): 15 mL via OROMUCOSAL

## 2022-11-01 MED ORDER — IOHEXOL 350 MG/ML SOLN
75.0000 mL | Freq: Once | INTRAVENOUS | Status: AC | PRN
  Administered 2022-11-01: 75 mL via INTRAVENOUS

## 2022-11-01 MED ORDER — PHENYLEPHRINE 200 MCG/ML (NON-ED) FOR PRIAPISM
200.0000 ug | Freq: Once | INTRAMUSCULAR | Status: DC
  Filled 2022-11-01: qty 10

## 2022-11-01 MED ORDER — HYDRALAZINE HCL 20 MG/ML IJ SOLN: 10.0000 mg | Freq: Four times a day (QID) | INTRAMUSCULAR | Status: DC | PRN

## 2022-11-01 MED ORDER — MORPHINE BOLUS VIA INFUSION
2.0000 mg | INTRAVENOUS | Status: DC | PRN
  Administered 2022-11-01 (×6): 4 mg via INTRAVENOUS

## 2022-11-01 MED ORDER — MORPHINE SULFATE (PF) 2 MG/ML IV SOLN
2.0000 mg | INTRAVENOUS | Status: AC | PRN
  Administered 2022-11-01 (×2): 2 mg via INTRAVENOUS

## 2022-11-01 MED ORDER — POLYVINYL ALCOHOL 1.4 % OP SOLN: 1.0000 [drp] | Freq: Four times a day (QID) | OPHTHALMIC | Status: DC | PRN

## 2022-11-01 MED ORDER — ORAL CARE MOUTH RINSE: 15.0000 mL | OROMUCOSAL | Status: DC | PRN

## 2022-11-01 MED ORDER — MORPHINE 100MG IN NS 100ML (1MG/ML) PREMIX INFUSION
0.0000 mg/h | INTRAVENOUS | Status: DC
  Administered 2022-11-01: 5 mg/h via INTRAVENOUS
  Administered 2022-11-01: 20 mg/h via INTRAVENOUS
  Filled 2022-11-01 (×2): qty 100

## 2022-11-01 MED ORDER — ETOMIDATE 2 MG/ML IV SOLN: 20.0000 mg | Freq: Once | INTRAVENOUS | Status: AC

## 2022-11-03 LAB — CULTURE, BLOOD (ROUTINE X 2)
Culture: NO GROWTH
Culture: NO GROWTH
Special Requests: ADEQUATE
Special Requests: ADEQUATE

## 2022-11-10 NOTE — Significant Event (Signed)
Rapid Response Event Note   Reason for Call :  Syncopy  Per RN, pt was on steady chair being taken back to his bed from the bathroom when he collapsed. Pt was placed back in bed, CBG-144,  VS obtained, and RRT called.  Initial Focused Assessment:  Pt lying in bed with eyes closed. He is cheynes-stokes breathing. He will localize to pain in all 4 extremities but will not open eyes. He has no gag. Pupils 2, sluggish, and equal. Pt has a L sided gaze-this is interrmitent. Lungs diminished t/o. Skin cool to touch.   HR-135, BP-157/99, RR-0-24(cheynes-stokes breathing pattern), SpO2-85-99% on RA.  Dr. Alcario Drought at bedside on my arrival. Dr. Arora(Neuro) to bedside to assess. Dr. Olalere(PCCM) to bedside to assess.  Interventions:  CBG-144 IVT started ABG, PCXR CBC, BMP CT head EEG Tx to 2H15 Plan of Care:  Pt not protecting airway. Plan to tx to 2H15, intubate, and then obtain ABG/PCXR/CMC/CMP/CT scan/EEG.   Event Summary:   MD Notified: Dr. Alcario Drought notified and came to bedside, Dr. Rory Percy to bedside. PCCM consulted and came to bedside.  Call [place]  Dillard Essex, RN

## 2022-11-10 NOTE — Progress Notes (Addendum)
eLink Physician-Brief Progress Note Patient Name: Glen Martinez DOB: 1931-09-20 MRN: [id number]   Date of Service  11-13-2022  HPI/Events of Note  89 year old male with right MCA stroke with petechial hemorrhage, atrial fib, Transferred to ICU after witnessed syncopal episode during BM on toilet. Staff denied head trauma. He was moved to bed. PCCM consulted for intubation for airway protection and STAT CT head  eICU Interventions  F/u CT head EEG ordered Full vent support ABG pending    13-Nov-2022 CT head Nov 13, 2022 evolution of right MCA associated with petechial blood products without hemorrhagic transformation. ?Asymmetric hyperdensity of left ICA for possible intraluminal thrombus. Neuro recommends CTA.    ABG reviewed and appropriate O2 and ventilation SBP 184/110> PRN hydralazine ordered  3:24 AM Foley ordered   Intervention Category Evaluation Type: New Patient Evaluation  Riaan Toledo Rodman Pickle 2022/11/13, 12:45 AM

## 2022-11-10 NOTE — TOC Progression Note (Addendum)
Transition of Care [facility]) - Progression Note    Patient Details  Name: Glen Martinez MRN: [id number] Date of Birth: 06-10-1931  Transition of Care [facility]) CM/SW [place], [place] Phone Number: 11-25-2022, 3:30 PM  Clinical Narrative:      Update- CSW received message from [place] with [facility] in [place] who informed CSW patient was approved for hospice home. Lorayne Bender informed CSW no bed availability today. CSW informed MD.   CSW received consult  to make referral to residential hospice placement and that family requesting residential hospice in [place]. CSW spoke with Mechele Claude Patients daughter who gave CSW permission to make referral to [facility] in [place]. CSW spoke with Sherri with [facility] and made referral for residential hospice placement for patient.CSW will continue to follow and assist with patients dc planning needs.  Expected Discharge Plan: [place] Barriers to Discharge: Continued Medical Work up, Ship broker  Expected Discharge Plan and [place] Choice: [place] Living arrangements for the past 2 months: Single Family Home                                       Social Determinants of Health (SDOH) Interventions SDOH Screenings   Tobacco Use: Low Risk  (09/22/2022)    Readmission Risk Interventions     No data to display

## 2022-11-10 NOTE — Progress Notes (Signed)
SLP Cancellation Note  Patient Details Name: Glen Martinez MRN: [id number] DOB: [birth year]   Cancelled treatment:        Pt with acute change in status overnight and transferred to higher level of care with finding of new L CVA.  Pt intubated at present and is not medically appropriate for ongoing dysphagia management.  SLP will sign off at this time.  Please reconsult ST post extubation when pt is medically ready for PO trials.    Celedonio Savage, MA, [place] Office: [phone] 2022-11-26, 9:58 AM

## 2022-11-10 NOTE — Progress Notes (Addendum)
Neurology progress note  Patient CT head was reviewed and concern for dense left ICA. Stat CT angiography head and neck ordered which shows a left internal carotid occlusion right after the bifurcation. There is very poor collateralization in the left cerebral hemisphere. Unfortunately he is not a candidate for IV thrombolysis due to recent stroke with HT. EVT still remains an option since the CT head aspects was 10. I called the patient's daughter to discuss this imaging finding as well as the options of possible neuroendovascular intervention that can be offered with the understanding that he would be an extremely high risk candidate given the very fresh right hemispheric stroke with hemorrhagic transformation. The daughter did want me to discuss this with the interventionalists, which I did.  The interventionalist was amenable to the procedure with the understanding that the outcome may still be that he does not gain any benefit in terms of neurologically meaningful recovery or functional status recovery as well as higher than normal risk of bleeding. I discussed again in detail with the daughter, explained the risks including that of hemorrhage, after which, she asked me some very good questions regarding likelihood of him being independent, which I told her would be very unlikely given the already right hemispheric stroke, the new left hemispheric stroke that is developing with extremity collaterals and his advanced age.   She said that he enjoyed driving his car and would not want to give it up and he also would not want to give up living in his own house and if with the strokes now on both sides of the brain, either with the intervention for the acute left hemispheric stroke or without, if that is not a possibility, that he would not want to proceed with any kind of intervention.  I also discussed the CODE STATUS-she would want him to be a DNR.  I will relay these findings and conversation details  to the primary team-PCCM.  I appreciate the interventionalist taking my call and discussing this case with me.  I have answered all the questions to the daughter Ms. Manvir Prabhu over 2 phone calls tonight.  The plan is to support him to the morning, let the family come by, make sure to have the same discussions as above in person and then make decisions on one-way extubation-which if he survives, that is fine but if he does not, he can be placed on comfort measures and can pass peacefully.  Stroke team will continue to follow  Plan was discussed with Dr. Loanne Drilling from PCCM over the phone  -- Amie Portland, MD Neurologist [facility] Pager: [phone]  Additional 30 min cc time.

## 2022-11-10 NOTE — Consult Note (Signed)
NAME:  Glen Martinez, MRN:  [id number], DOB:  26-Oct-1930, LOS: 4 ADMISSION DATE:  10/20/2022, CONSULTATION DATE: 11/06/22 REFERRING MD: Dr. Maryland Pink, CHIEF COMPLAINT: Stroke  History of Present Illness:  Called to see patient emergently with inability to protect his airway Activated following a syncopal event from which he was unable to wake up Witnessed syncope while having a bowel motion on the toilet , Did not fall, did not hit his head, was assisted to the ground. Remains unresponsive, labored breathing, Cheyne-Stokes breathing No cough, no gag Started spontaneously coughing Apparently had received Seroquel and Ativan this morning  Pertinent  Medical History   Past Medical History:  Diagnosis Date   CVA (cerebral vascular accident) ([place]) 07/09/2021   No deficits   Diabetes mellitus without complication (HCC)    GERD (gastroesophageal reflux disease)    Gout    Hyperlipidemia    Hypertension    Hypothyroidism    PAF (paroxysmal atrial fibrillation) ([place])    Wears dentures    partial upper and lower     Significant Hospital Events: Including procedures, antibiotic start and stop dates in addition to other pertinent events   CT head 1/19 1. Right MCA territory infarct with findings suspicious for small petechial hemorrhage. No midline shift. 2. Mild age-related atrophy and chronic microvascular ischemic changes.    Interim History / Subjective:  Patient transferred to ICU  Objective   Blood pressure (!) 185/135, pulse (!) 121, temperature 98.2 F (36.8 C), temperature source Axillary, resp. rate 13, height '5\' 6"'$  (1.676 m), weight 72.6 kg, SpO2 100 %.    FiO2 (%):  [100 %] 100 %   Intake/Output Summary (Last 24 hours) at November 06, 2022 0040 Last data filed at 10/31/2022 0541 Gross per 24 hour  Intake 456.49 ml  Output 1200 ml  Net -743.51 ml   Filed Weights   10/22/2022 1834  Weight: 72.6 kg    Examination: General: Elderly, does not appear to be in  distress HENT: Moist oral mucosa Lungs: Clear breath sounds Cardiovascular: S1-S2 appreciated Abdomen: Soft, bowel sounds appreciated Extremities: No edema, no clubbing Neuro: Unresponsive, no gag GU:   Resolved Hospital Problem list     Assessment & Plan:  Large right MCA stroke with small petechial hemorrhages -Needs CT to assess evolution of stroke -Will get EEG  A-fib with RVR -On aspirin -On metoprolol To consider Eliquis in 3 days  Diabetes -SSI  Hypertension -Hold antihypertensives  Continue to monitor closely Prognosis will definitely depend on evolution of the stroke  Best Practice (right click and "Reselect all SmartList Selections" daily)   Diet/type: NPO DVT prophylaxis:  GI prophylaxis: H2B Lines: N/A Foley:  N/A Code Status:  full code Last date of multidisciplinary goals of care discussion [pending]  Labs   CBC: Recent Labs  Lab 11/06/2022 1840 10/12/2022 1919 10/29/22 0507 10/30/22 0402 10/31/22 0315  WBC 22.6*  --  18.1* 14.8* 11.9*  NEUTROABS 19.6*  --   --   --   --   HGB 15.6 16.7 14.5 14.2 12.9*  HCT 47.4 49.0 42.3 42.8 40.1  MCV 93.9  --  91.6 94.9 95.5  PLT 199  --  164 142* 142*    Basic Metabolic Panel: Recent Labs  Lab 10/24/2022 1919 10/24/2022 2000 10/29/22 0507 10/30/22 0402 10/31/22 0315  NA 139 140 140 139 145  K 4.1 3.5 3.4* 4.8 3.7  CL 104 101 104 109 111  CO2  --  21* 22 18* 27  GLUCOSE  199* 196* 124* 103* 106*  BUN 43* 29* 37* 33* 25*  CREATININE 2.40* 2.29* 2.05* 1.50* 1.44*  CALCIUM  --  8.8* 8.3* 8.1* 8.3*   GFR: Estimated Creatinine Clearance: 31.4 mL/min (A) (by C-G formula based on SCr of 1.44 mg/dL (H)). Recent Labs  Lab 10/27/2022 1840 10/29/22 0507 10/29/22 1853 10/29/22 2032 10/30/22 0402 10/31/22 0315  WBC 22.6* 18.1*  --   --  14.8* 11.9*  LATICACIDVEN  --   --  1.7 1.9  --   --     Liver Function Tests: Recent Labs  Lab 10/21/2022 2000 10/29/22 0507 10/30/22 0402 10/31/22 0315  AST 223*  198* 134* 86*  ALT 59* 58* 54* 57*  ALKPHOS 92 83 75 69  BILITOT 1.5* 0.9 1.4* 0.4  PROT 7.4 6.5 6.1* 5.9*  ALBUMIN 4.0 3.6 3.2* 3.0*   No results for input(s): "LIPASE", "AMYLASE" in the last 168 hours. No results for input(s): "AMMONIA" in the last 168 hours.  ABG    Component Value Date/Time   TCO2 22 10/17/2022 1919     Coagulation Profile: Recent Labs  Lab 10/10/2022 1840 10/29/22 0507  INR 1.2 1.2    Cardiac Enzymes: Recent Labs  Lab 10/27/2022 2000 10/30/22 0402 10/31/22 0315  CKTOTAL 11,581* 4,099* 1,907*    HbA1C: Hgb A1c MFr Bld  Date/Time Value Ref Range Status  10/29/2022 05:07 AM 6.5 (H) 4.8 - 5.6 % Final    Comment:    (NOTE) Pre diabetes:          5.7%-6.4%  Diabetes:              >6.4%  Glycemic control for   <7.0% adults with diabetes   07/10/2021 12:15 AM 7.2 (H) 4.8 - 5.6 % Final    Comment:    (NOTE) Pre diabetes:          5.7%-6.4%  Diabetes:              >6.4%  Glycemic control for   <7.0% adults with diabetes     CBG: Recent Labs  Lab 10/31/22 0804 10/31/22 1207 10/31/22 1613 10/31/22 2005 10/31/22 2315  GLUCAP 121* 165* 160* 224* 144*    Review of Systems:   Unable to provide  Past Medical History:  He,  has a past medical history of CVA (cerebral vascular accident) ([place]) (07/09/2021), Diabetes mellitus without complication ([place]), GERD (gastroesophageal reflux disease), Gout, Hyperlipidemia, Hypertension, Hypothyroidism, PAF (paroxysmal atrial fibrillation) ([place]), and Wears dentures.   Surgical History:   Past Surgical History:  Procedure Laterality Date   CATARACT EXTRACTION W/PHACO Left 05/02/2018   Procedure: CATARACT EXTRACTION PHACO AND INTRAOCULAR LENS PLACEMENT ([place]) DIABETIC;  Surgeon: Leandrew Koyanagi, MD;  Location: [facility];  Service: Ophthalmology;  Laterality: Left;  diabetic - insulin   CATARACT EXTRACTION W/PHACO Right 09/21/2022   Procedure: CATARACT EXTRACTION PHACO AND INTRAOCULAR  LENS PLACEMENT ([facility]) RIGHT 12.41 01:15.0;  Surgeon: Leandrew Koyanagi, MD;  Location: [facility];  Service: Ophthalmology;  Laterality: Right;  Diabetic   COLONOSCOPY     HERNIA REPAIR     LOOP RECORDER INSERTION N/A 07/12/2021   Procedure: LOOP RECORDER INSERTION;  Surgeon: Evans Lance, MD;  Location: [facility];  Service: Cardiovascular;  Laterality: N/A;   TONSILLECTOMY       Social History:   reports that he has never smoked. He has never used smokeless tobacco. He reports that he does not currently use alcohol. He reports that he does  not use drugs.   Family History:  His family history is not on file.   Allergies No Known Allergies   The patient is critically ill with multiple organ systems failure and requires high complexity decision making for assessment and support, frequent evaluation and titration of therapies, application of advanced monitoring technologies and extensive interpretation of multiple databases. Critical Care Time devoted to patient care services described in this note independent of APP/resident time (if applicable)  is 32 1/94/1740 minutes.   Sherrilyn Rist MD [place] [facility] Personal pager: See Amion If unanswered, please page CCM On-call: [phone]

## 2022-11-10 NOTE — Procedures (Signed)
Orogastric tube placed uneventfully

## 2022-11-10 NOTE — Procedures (Signed)
Extubation Procedure Note  Patient Details:   Name: [name] DOB: 1931-05-08 MRN: [id number]   Airway Documentation:    Vent end date: 11-14-22 Vent end time: 1516   Evaluation  Patient extubated per comfort care measures. RN at bedside.  [name] 11-14-22, 3:16 PM

## 2022-11-10 NOTE — Plan of Care (Signed)
  Overnight event was noted: (please see progress note by Dr. Malen Gauze). Per discussion with family patient would likely be terminally extubated this morning after [place] conversation. Family met this morning with Critical care and are leaning towards terminally extubation. Critical care will call neuro if patient family wishes to speak with neurology again.   Laurey Morale, MSN, NP-C [facility] See AMION or use Epic Chat

## 2022-11-10 NOTE — Progress Notes (Signed)
Postintubation chest x-ray reviewed by myself showing endotracheal tube about 4 cm above carina, advanced by 1 cm  OG tube in stomach

## 2022-11-10 NOTE — IPAL (Addendum)
  Interdisciplinary Goals of Care Family Meeting   Date carried out: 11-04-2022  Location of the meeting: Bedside  Member's involved: Bedside Registered Nurse, Family Member or next of kin, and Other: PA-C  Durable Power of [employer] or acting medical decision maker: Daughter  Mechele Claude  Discussion: We discussed goals of care for [employer] .  Dr. Rory Percy spoke with family overnight regarding evolving stroke and poor prognosis and high risk of bleeding if intervention was made. Daughter agrees to holding off on further intervention and would discuss with family about one-way extubation in am. Also patient was made DNR. This am spoke with patient's daughter and other family members at bedside regarding [place]. Family agreed that patient would not want to be on long term ventilation and do not want to prolong any suffering. I recommended we try to wean sedation off and perform one-way extubation today and transition to comfort care. Family in agreement. They do want to discuss with one more family member and make sure they are in agreement with plan and will notify us later today when they are ready to proceed. Family states they would like to transfer him to a hospic facility in [place] if possible. Will reach out to [facility].  Code status:   Code Status: DNR   Disposition: one-way extubation and likely transition to In-patient comfort care  Time spent for the meeting: 35 minutes   Mick Sell, PA-C  2022-11-04, 11:20 AM

## 2022-11-10 NOTE — Progress Notes (Signed)
NAME:  Glen Martinez, MRN:  [id number], DOB:  July 28, 1931, LOS: 4 ADMISSION DATE:  10/23/2022, CONSULTATION DATE: 11-26-2022 REFERRING MD: Dr. Maryland Pink, CHIEF COMPLAINT: Stroke  History of Present Illness:  Patient is a 89 yo M w/ pertinent PMH PAF on coumadin, CVA, DMT2, HTN, HLD, CKD 3a, [facility] to [facility] 1/19 code stroke.   1/19 patient had Left sided weakness and slurred speech. EMS arrived. Patient in afib rvr rate 210. Given Cardizem. Transferred to [facility]. Rate 110s on arrival. SBP 160s. Neuro consulted. CT head/MRI showing Large acute ischemia in posterior R MCA w/ petechial hemorrhage in infarcted territory. AC held and continued ASA.  1/22 PCCM consulted  to see patient emergently with inability to protect his airway.  Activated following a syncopal event from which he was unable to wake up. Witnessed syncope while having a bowel motion on the toilet. Did not fall, or  hit his head. Remains unresponsive, labored breathing, Cheyne-Stokes breathing No cough, no gag Started spontaneously coughing Apparently had received Seroquel and Ativan this morning. Patient intubated for airway protection.  Pertinent  Medical History   Past Medical History:  Diagnosis Date   CVA (cerebral vascular accident) ([place]) 07/09/2021   No deficits   Diabetes mellitus without complication (HCC)    GERD (gastroesophageal reflux disease)    Gout    Hyperlipidemia    Hypertension    Hypothyroidism    PAF (paroxysmal atrial fibrillation) ([place])    Wears dentures    partial upper and lower     Significant Hospital Events: Including procedures, antibiotic start and stop dates in addition to other pertinent events   CT head 1/19 1. Right MCA territory infarct with findings suspicious for small petechial hemorrhage. No midline shift. 2. Mild age-related atrophy and chronic microvascular ischemic changes.  11/26/22: Less responsive, no cough/gag; intubated for airway protection; CT head concerning for dense  Left ICA; CTA left internal carotid occlusion after bifurcation; EEG no seizure, severe encephalopathy; Neuro discussed w/ daughter findings, decision to be made DNR and   Interim History / Subjective:  On mech vent Low dose propofol Cough/gag reflex in tact  Objective   Blood pressure 138/80, pulse (!) 109, temperature 98.9 F (37.2 C), temperature source Oral, resp. rate 17, height '5\' 6"'$  (1.676 m), weight 72.6 kg, SpO2 99 %.    Vent Mode: PRVC FiO2 (%):  [40 %-100 %] 40 % Set Rate:  [14 bmp] 14 bmp Vt Set:  [510 mL] 510 mL PEEP:  [5 cmH20] 5 cmH20 Plateau Pressure:  [13 cmH20-16 cmH20] 13 cmH20   Intake/Output Summary (Last 24 hours) at November 26, 2022 1020 Last data filed at 11/26/2022 1000 Gross per 24 hour  Intake 1297.87 ml  Output 1135 ml  Net 162.87 ml    Filed Weights   11/04/2022 1834  Weight: 72.6 kg    Examination: General: critically ill appearing on mech vent HEENT: MM pink/moist; ETT in place Neuro: sedate, not following commands; cough/gag reflex in tact; pupils 96m sluggish CV: s1s2, irregularly irregular, no m/r/g PULM:  dim clear BS bilaterally; on mech vent PRVC GI: soft, bsx4 active  Extremities: warm/dry, no edema  Skin: no rashes or lesions appreciated  Resolved Hospital Problem list     Assessment & Plan:  Large right MCA stroke with small petechial hemorrhages -12024-02-17CT head acute occlusion of left ICA distal to carotid bifurcation -1February 17, 2024EEG negative for seizure Plan: -Neuro following; appreciate recs -family deferred further procedures given risk and likely poor outcome; overall  poor prognosis; will have family discussion regarding GOC -frequent neuro checks -limit sedating meds -BP goal per neuro -ASA and statin per neuro -AC on hold -Continue neuroprotective measures- normothermia, euglycemia, HOB greater than 30, head in neutral alignment, normocapnia, normoxia.   Acute respiratory failure: intubated due to poor GCS OSA Plan: -intubated  yesterday -see ipal note; likely one-way extubation later today; waiting on family -LTVV strategy with tidal volumes of 6-8 cc/kg ideal body weight -Wean PEEP/FiO2 for SpO2 >92% -VAP bundle in place -Daily SAT and SBT -PAD protocol in place -wean sedation for RASS goal 0 to -1  A-fib with RVR Plan: -AC on hold per neuro -cont asa and metoprolol  DMT2 Plan: -ssi and cbg monitoring  Leukocytosis: reactive? Plan: -trending down -cultures negative -trend cbc  Thrombocytopenia Plan: -trend cbc  Hypertension HLD Plan: -Hold antihypertensives -cont statin  AKI Plan: -Trend BMP / urinary output -Replace electrolytes as indicated -Avoid nephrotoxic agents, ensure adequate renal perfusion  Elevated LFTs Plan: -improving -trend cmp  Hypothyroidism Plan: -synthroid  Best Practice (right click and "Reselect all SmartList Selections" daily)   Diet/type: NPO DVT prophylaxis: SCDs GI prophylaxis: H2B Lines: N/A Foley:  Yes, and it is still needed Code Status:  DNR Last date of multidisciplinary goals of care discussion November 07, 2022 see separate ipal note]  Labs   CBC: Recent Labs  Lab 10/13/2022 1840 10/20/2022 1919 10/29/22 0507 10/30/22 0402 10/31/22 0315 11/07/22 0120 2022-11-07 0121  WBC 22.6*  --  18.1* 14.8* 11.9*  --  10.5  NEUTROABS 19.6*  --   --   --   --   --   --   HGB 15.6   < > 14.5 14.2 12.9* 12.6* 13.5  HCT 47.4   < > 42.3 42.8 40.1 37.0* 41.9  MCV 93.9  --  91.6 94.9 95.5  --  96.1  PLT 199  --  164 142* 142*  --  128*   < > = values in this interval not displayed.     Basic Metabolic Panel: Recent Labs  Lab 10/30/2022 2000 10/29/22 0507 10/30/22 0402 10/31/22 0315 11-07-2022 0120 Nov 07, 2022 0121  NA 140 140 139 145 147* 144  K 3.5 3.4* 4.8 3.7 3.6 3.9  CL 101 104 109 111  --  113*  CO2 21* 22 18* 27  --  21*  GLUCOSE 196* 124* 103* 106*  --  118*  BUN 29* 37* 33* 25*  --  26*  CREATININE 2.29* 2.05* 1.50* 1.44*  --  1.60*  CALCIUM 8.8*  8.3* 8.1* 8.3*  --  8.3*    GFR: Estimated Creatinine Clearance: 28.2 mL/min (A) (by C-G formula based on SCr of 1.6 mg/dL (H)). Recent Labs  Lab 10/29/22 0507 10/29/22 1853 10/29/22 2032 10/30/22 0402 10/31/22 0315 11-07-22 0121  WBC 18.1*  --   --  14.8* 11.9* 10.5  LATICACIDVEN  --  1.7 1.9  --   --   --      Liver Function Tests: Recent Labs  Lab 10/14/2022 2000 10/29/22 0507 10/30/22 0402 10/31/22 0315 07-Nov-2022 0121  AST 223* 198* 134* 86* 61*  ALT 59* 58* 54* 57* 50*  ALKPHOS 92 83 75 69 73  BILITOT 1.5* 0.9 1.4* 0.4 0.7  PROT 7.4 6.5 6.1* 5.9* 6.1*  ALBUMIN 4.0 3.6 3.2* 3.0* 3.1*    No results for input(s): "LIPASE", "AMYLASE" in the last 168 hours. No results for input(s): "AMMONIA" in the last 168 hours.  ABG    Component  Value Date/Time   PHART 7.353 November 17, 2022 0120   PCO2ART 42.2 11-17-2022 0120   PO2ART 466 (H) 17-Nov-2022 0120   HCO3 23.6 November 17, 2022 0120   TCO2 25 11/17/2022 0120   ACIDBASEDEF 2.0 11/17/2022 0120   O2SAT 100 17-Nov-2022 0120     Coagulation Profile: Recent Labs  Lab 11/09/2022 1840 10/29/22 0507  INR 1.2 1.2     Cardiac Enzymes: Recent Labs  Lab 10/26/2022 2000 10/30/22 0402 10/31/22 0315  CKTOTAL 11,581* 4,099* 1,907*     HbA1C: Hgb A1c MFr Bld  Date/Time Value Ref Range Status  10/29/2022 05:07 AM 6.5 (H) 4.8 - 5.6 % Final    Comment:    (NOTE) Pre diabetes:          5.7%-6.4%  Diabetes:              >6.4%  Glycemic control for   <7.0% adults with diabetes   07/10/2021 12:15 AM 7.2 (H) 4.8 - 5.6 % Final    Comment:    (NOTE) Pre diabetes:          5.7%-6.4%  Diabetes:              >6.4%  Glycemic control for   <7.0% adults with diabetes     CBG: Recent Labs  Lab 10/31/22 1207 10/31/22 1613 10/31/22 2005 10/31/22 2315 November 17, 2022 0746  GLUCAP 165* 160* 224* 144* 98     Review of Systems:   Unable to provide  Past Medical History:  He,  has a past medical history of CVA (cerebral vascular  accident) ([place]) (07/09/2021), Diabetes mellitus without complication ([place]), GERD (gastroesophageal reflux disease), Gout, Hyperlipidemia, Hypertension, Hypothyroidism, PAF (paroxysmal atrial fibrillation) ([place]), and Wears dentures.   Surgical History:   Past Surgical History:  Procedure Laterality Date   CATARACT EXTRACTION W/PHACO Left 05/02/2018   Procedure: CATARACT EXTRACTION PHACO AND INTRAOCULAR LENS PLACEMENT ([place]) DIABETIC;  Surgeon: Leandrew Koyanagi, MD;  Location: [facility];  Service: Ophthalmology;  Laterality: Left;  diabetic - insulin   CATARACT EXTRACTION W/PHACO Right 09/21/2022   Procedure: CATARACT EXTRACTION PHACO AND INTRAOCULAR LENS PLACEMENT ([facility]) RIGHT 12.41 01:15.0;  Surgeon: Leandrew Koyanagi, MD;  Location: [facility];  Service: Ophthalmology;  Laterality: Right;  Diabetic   COLONOSCOPY     HERNIA REPAIR     LOOP RECORDER INSERTION N/A 07/12/2021   Procedure: LOOP RECORDER INSERTION;  Surgeon: Evans Lance, MD;  Location: [facility];  Service: Cardiovascular;  Laterality: N/A;   TONSILLECTOMY       Social History:   reports that he has never smoked. He has never used smokeless tobacco. He reports that he does not currently use alcohol. He reports that he does not use drugs.   Family History:  His family history is not on file.   Allergies No Known Allergies   Critical Care Time: 35 minutes  JD Rexene Agent [facility] 11-17-2022, 11:31 AM  Please see [email or web address] for pager details.  From 7A-7P if no response, please call [phone]. After hours, please call ELink [phone].

## 2022-11-10 NOTE — Progress Notes (Signed)
EEG complete - results pending 

## 2022-11-10 NOTE — Progress Notes (Signed)
Pt transported from 2H15 to CT and back without any complications. RN at bedside.

## 2022-11-10 NOTE — Progress Notes (Signed)
Manufacturing engineer [facility]) Hospital Liaison Note  Received request from [employer] [place] for family interest in [place]. Visited patient at bedside and spoke with daughter/Joanne to confirm interest and explain services.  Patient has been approved for the Hospice.Unfortunately, [facility] is not able to offer a room today. Family and [facility] Manager aware hospital liaison will follow up tomorrow or sooner if a room becomes available.   Please do not hesitate to call with any hospice related questions.    Thank you for the opportunity to participate in this patient's care.  Phillis Haggis, MSW [facility]iaison  [phone]

## 2022-11-10 NOTE — Progress Notes (Signed)
Pt transported to CT and back to 2H15 with no events to report.

## 2022-11-10 NOTE — Death Summary Note (Signed)
DEATH SUMMARY   Patient Details  Name: Glen Martinez MRN: [id number] DOB: 06-23-1931  Admission/Discharge Information   Admit Date:  11-Nov-2022  Date of Death: Date of Death: Nov 15, 2022  Time of Death: Time of Death: 01/04/1924  Length of Stay: 4  Referring Physician: Tracie Harrier, MD   Reason(s) for Hospitalization  Acute R MCA CVA  Diagnoses  Preliminary cause of death:  Secondary Diagnoses (including complications and co-morbidities):  Principal Problem:   Acute ischemic right MCA stroke [facility]) Active Problems:   Benign prostatic hyperplasia   Hyperlipidemia associated with type 2 diabetes mellitus ([place])   Hypertension associated with diabetes ([place])   Hypothyroidism   Type 2 diabetes mellitus with stage 3 chronic kidney disease, with long-term current use of insulin (HCC)   Paroxysmal atrial fibrillation ([place])   Acute renal failure superimposed on stage 3a chronic kidney disease (HCC)   Leukocytosis   Elevated LFTs   OSA (obstructive sleep apnea)   Acute respiratory failure ([place])   AKI (acute kidney injury) ([place]) Acute respiratory with acute respiratory failure with hypoxia requiring manage mechanical ventilation Acute left ICA occlusion large left stroke in the MCA-ACA territory Atrial fibrillation with RVR   Brief Hospital Course (including significant findings, care, treatment, and services provided and events leading to death)  Glen Martinez is a 89 y.o. year old male  with medical history significant for PAF on Coumadin, history of CVA, insulin-dependent T2DM, CKD stage IIIa, HTN, HLD, hypothyroidism, BPH, OSA on CPAP who presented to the ED for evaluation of left-sided weakness, slurred speech, and left facial droop.   Patient was last known well when he spoke to family on Jan 04, 2023 (1/17).  Patient states sometime yesterday he began to have difficulty walking and fell.  His granddaughter went to his home and found him on the floor next to the bed and on his  knees.  He was having difficulty standing up.  He had abrasions on his left leg.  He was noted to have slurred speech as well.   EMS were called and per ED documentation he was noted to have A-fib RVR heart rate 210.  He was given 10 mg Cardizem with heart rate of 110 on arrival to the hospital.   Of note, patient was previously on Eliquis but appears anticoagulation was changed to Coumadin due to cost.  INR was supratherapeutic at 4.0 on 12/28 and subtherapeutic on 1/4 and 1/11.   ED Course  Labs/Imaging on admission: I have personally reviewed following labs and imaging studies.   Initial vitals showed BP 168/89, pulse 126, RR 22, temp 98.3 F, SpO2 100% on room air.   Labs show WBC 22.6, hemoglobin 15.6, platelets 199,000, sodium 140, potassium 3.5, bicarb 21, BUN 29, creatinine 2.29 (baseline 1.4-1.5), serum glucose 196, AST 223, ALT 59, alk phos 92, total bilirubin 1.5.  Serum ethanol <10.  UDS negative.  UA negative for UTI.  Blood cultures in process.   MRI brain shows a large area of acute ischemia within the posterior right MCA territory.  Petechial hemorrhage within the infarcted territory noted.  Old right PCA territory infarct also seen.   CT cervical spine negative for acute fracture or subluxation.   Follow-up chest x-ray negative for focal consolidation, edema, effusion.  Loop recorder seen in place.   Left knee x-ray negative for acute fracture or dislocation.   Patient was given 1 L normal saline.  Neurology consulted and recommended medical admission.  The hospitalist service was consulted to  admit  He was treated for his CVA but unfortunately developed acute neuro changes on 1/22 overnight. He was intubated overnight for airway protection and imaging demonstrated a catastraophic L ICA occlusion and stroke. Despite maximal treatment, his prognosis was limited and family elected to focus on his comfort. He passed away peacefully on 11/05/22.   Pertinent Labs and Studies   Significant Diagnostic Studies EEG adult  Result Date: 11-05-2022 Lora Havens, MD     2022-11-05  8:45 AM Patient Name: Glen Martinez MRN: [id number] Epilepsy Attending: Lora Havens Referring Physician/Provider: Amie [place], MD Date: 05-Nov-2022 Duration: 27.06 mins Patient history: 89 year old male with syncope.  EEG to evaluate for seizure. Level of alertness: comatose AEDs during EEG study: Propofol Technical aspects: This EEG study was done with scalp electrodes positioned according to the 10-20 International system of electrode placement. Electrical activity was reviewed with band pass filter of 1-'70Hz'$ , sensitivity of 7 uV/mm, display speed of 38m/sec with a '60Hz'$  notched filter applied as appropriate. EEG data were recorded continuously and digitally stored.  Video monitoring was available and reviewed as appropriate. Description: EEG showed continuous generalized 3 to 5 Hz theta and delta slowing with overriding 12 to 15 Hz beta activity. Hyperventilation and photic stimulation were not performed.   ABNORMALITY - Continuous slow, generalized IMPRESSION: This study is suggestive of severe diffuse encephalopathy, nonspecific to etiology but could be secondary to sedation.  No seizures or epileptiform discharges were seen throughout the recording. PLora Havens  DG Chest 1 View  Result Date: 1January 27, 2024CLINICAL DATA:  Aspiration EXAM: CHEST  1 VIEW COMPARISON:  Chest radiograph 10/15/2022 FINDINGS: The endotracheal tube tip is proximally 4.1 cm from the carina. The enteric catheter tip is in the stomach with sidehole just below the level of the GE junction. A cardiac recorder device is again noted. The cardiomediastinal silhouette is normal. There is no focal consolidation or pulmonary edema. There is no pleural effusion or pneumothorax. Enteric contrast is noted in the bowel in the imaged abdomen. There is no acute osseous abnormality. IMPRESSION: 1. Endotracheal and enteric tubes in  appropriate position. 2. Clear lungs. Electronically Signed   By: PValetta MoleM.D.   On: 001-27-202408:06   CT ANGIO HEAD NECK W WO CM  Result Date: 101/27/2024CLINICAL DATA:  Initial evaluation for acute stroke. Concern for LVO on prior CT. EXAM: CT ANGIOGRAPHY HEAD AND NECK TECHNIQUE: Multidetector CT imaging of the head and neck was performed using the standard protocol during bolus administration of intravenous contrast. Multiplanar CT image reconstructions and MIPs were obtained to evaluate the vascular anatomy. Carotid stenosis measurements (when applicable) are obtained utilizing NASCET criteria, using the distal internal carotid diameter as the denominator. RADIATION DOSE REDUCTION: This exam was performed according to the departmental dose-optimization program which includes automated exposure control, adjustment of the mA and/or kV according to patient size and/or use of iterative reconstruction technique. CONTRAST:  Please see contrast documentation. COMPARISON:  Prior head CT from earlier the same day as well as recent MRA from 10/30/2022. FINDINGS: CTA NECK FINDINGS Aortic arch: Visualized aortic arch normal caliber with standard 3 vessel morphology. Mild atheromatous change about the arch itself. No stenosis about the origin of the great vessels. Right carotid system: Right common and internal carotid arteries are patent without dissection. Mild atheromatous change about the right carotid bulb without hemodynamically significant greater than 50% stenosis. Left carotid system: Left CCA patent from its origin to the bifurcation. There is occlusion  of the left ICA just beyond the bifurcation, acute in appearance. Left ICA remains occluded within the neck. Vertebral arteries: Both vertebral arteries arise from the subclavian arteries. No proximal subclavian artery stenosis. Left vertebral artery dominant. Vertebral arteries patent without stenosis or dissection. Skeleton: No discrete or worrisome  osseous lesions. Prominent bridging osteophytic spurring noted within the visualized cervicothoracic spine, suggestive of DISH. Patient is edentulous. Other neck: Endotracheal and enteric tubes in place. No other acute soft tissue abnormality within the neck. Upper chest: Mild dependent atelectatic changes noted within the visualized right lung. Visualized upper chest demonstrates no other acute finding. Review of the MIP images confirms the above findings CTA HEAD FINDINGS Anterior circulation: Scattered atheromatous change within the right carotid siphon without hemodynamically significant stenosis. The left ICA remains occluded to the terminus. Left MCA occluded as well. No significant collateral flow seen within the left MCA distribution. Right A1 segment patent. Left A1 segment likely occluded proximally, but remains patent distally. Normal anterior communicating artery complex. Both anterior cerebral arteries patent without significant stenosis. Right M1 segment patent. Occlusion of a proximal right M3 branch (series 7, image 137), in keeping with the evolving right MCA distribution infarct. Remainder of the right MCA branches are somewhat attenuated but otherwise remain patent. Posterior circulation: Bo dominant left V4 segment widely patent. Atheromatous change within the hypoplastic right V4 segment without significant stenosis. Both PICA patent at their origins. Basilar patent without stenosis. Superior cerebellar arteries patent bilaterally. Both PCAs primarily supplied via the basilar. PCAs remain patent to their distal aspects without hemodynamically significant stenosis. Venous sinuses: Not well assessed due to arterial timing the contrast bolus. Anatomic variants: None significant.  No aneurysm. Review of the MIP images confirms the above findings IMPRESSION: 1. Acute occlusion of the left ICA just distal to the carotid bifurcation. Left ICA remains occluded to the terminus, with occlusion of the left  MCA distally. No significant collateral flow seen within the left MCA distribution. 2. Occlusion of a proximal right M3 branch, in keeping with the evolving right MCA distribution infarct. 3. Mild atheromatous change about the right carotid bulb and right carotid siphon without hemodynamically significant stenosis. Aortic Atherosclerosis (ICD10-I70.0). Critical Value/emergent results were discussed by telephone at the time of interpretation on 04-Nov-2022 at 2:33 a.m. to provider [place]. Electronically Signed   By: Jeannine Boga M.D.   On: November 04, 2022 03:10   CT HEAD WO CONTRAST (5MM)  Result Date: 04-Nov-2022 CLINICAL DATA:  Initial evaluation for neuro deficit, stroke suspected. EXAM: CT HEAD WITHOUT CONTRAST TECHNIQUE: Contiguous axial images were obtained from the base of the skull through the vertex without intravenous contrast. RADIATION DOSE REDUCTION: This exam was performed according to the departmental dose-optimization program which includes automated exposure control, adjustment of the mA and/or kV according to patient size and/or use of iterative reconstruction technique. COMPARISON:  Prior studies from 11/02/2022 and earlier. FINDINGS: Brain: Continued interval evolution of previously identified right MCA territory infarct. Associated petechial blood products without frank hemorrhagic transformation. No significant regional mass effect. No other acute large vessel territory infarct. Chronic right PCA distribution infarct again noted. No mass lesion or midline shift. No hydrocephalus or extra-axial fluid collection. Vascular: There is question of asymmetric hyperdensity involving the left ICA terminus (series 3, image 15). Calcified atherosclerosis present at skull base. Skull: Scalp soft tissues and calvarium within normal limits. Sinuses/Orbits: Globes orbital soft tissues demonstrate no acute finding. Mild scattered mucosal thickening about the ethmoidal air cells. Mastoid air cells are  clear. Patient is intubated. Other: None. IMPRESSION: 1. Continued interval evolution of previously identified right MCA territory infarct. Associated petechial blood products without frank hemorrhagic transformation. No significant regional mass effect. 2. Question of asymmetric hyperdensity involving the left ICA terminus. While this finding may be artifactual in nature, possible intraluminal thrombus could also have this appearance. Correlation with dedicated CTA suggested for further evaluation as warranted. 3. No other new acute intracranial abnormality. These results were communicated to Dr. Rory Percy at 1:47 am on 2022-11-09 by text page via the [facility] messaging system. Electronically Signed   By: Jeannine Boga M.D.   On: 2022-11-09 01:50   VAS US CAROTID  Result Date: 10/31/2022 Carotid Arterial Duplex Study Patient Name:  Glen Martinez  Date of Exam:   10/29/2022 Medical Rec #: [id number]        Accession #:    [id number] Date of Birth: 07-17-1931       Patient Gender: M Patient Age:   33 years Exam Location:  [facility] Procedure:      VAS US CAROTID Referring Phys: Alferd Patee [facility] --------------------------------------------------------------------------------  Indications:       CVA. Risk Factors:      Hypertension, hyperlipidemia, Diabetes. Comparison Study:  No prior studies. Performing Technologist: Oliver Hum RVT  Examination Guidelines: A complete evaluation includes B-mode imaging, spectral Doppler, color Doppler, and power Doppler as needed of all accessible portions of each vessel. Bilateral testing is considered an integral part of a complete examination. Limited examinations for reoccurring indications may be performed as noted.  Right Carotid Findings: +----------+--------+--------+--------+-----------------------+--------+           PSV cm/sEDV cm/sStenosisPlaque Description     Comments +----------+--------+--------+--------+-----------------------+--------+ CCA  Prox  83      14              smooth and heterogenous         +----------+--------+--------+--------+-----------------------+--------+ CCA Distal65      17              smooth and heterogenous         +----------+--------+--------+--------+-----------------------+--------+ ICA Prox  25      7               heterogenous and smooth         +----------+--------+--------+--------+-----------------------+--------+ ICA Distal42      12                                     tortuous +----------+--------+--------+--------+-----------------------+--------+ ECA       92      1                                               +----------+--------+--------+--------+-----------------------+--------+ +----------+--------+-------+--------+-------------------+           PSV cm/sEDV cmsDescribeArm Pressure (mmHG) +----------+--------+-------+--------+-------------------+ Subclavian89                                         +----------+--------+-------+--------+-------------------+ +---------+--------+--+--------+-+---------+ VertebralPSV cm/s24EDV cm/s5Antegrade +---------+--------+--+--------+-+---------+  Left Carotid Findings: +----------+--------+--------+--------+-----------------------+--------+           PSV cm/sEDV cm/sStenosisPlaque Description     Comments +----------+--------+--------+--------+-----------------------+--------+ CCA Prox  94  14              smooth and heterogenous         +----------+--------+--------+--------+-----------------------+--------+ CCA Distal72      11              smooth and heterogenous         +----------+--------+--------+--------+-----------------------+--------+ ICA Prox  33      12              smooth and heterogenous         +----------+--------+--------+--------+-----------------------+--------+ ICA Distal37      18                                     tortuous  +----------+--------+--------+--------+-----------------------+--------+ ECA       61      7                                               +----------+--------+--------+--------+-----------------------+--------+ +----------+--------+--------+--------+-------------------+           PSV cm/sEDV cm/sDescribeArm Pressure (mmHG) +----------+--------+--------+--------+-------------------+ Subclavian110                                         +----------+--------+--------+--------+-------------------+ +---------+--------+--+--------+--+---------+ VertebralPSV cm/s25EDV cm/s10Antegrade +---------+--------+--+--------+--+---------+   Summary: Right Carotid: Velocities in the right ICA are consistent with a 1-39% stenosis. Left Carotid: Velocities in the left ICA are consistent with a 1-39% stenosis. Vertebrals: Bilateral vertebral arteries demonstrate antegrade flow. *See table(s) above for measurements and observations.  Electronically signed by Antony Contras MD on 10/31/2022 at 3:46:42 PM.    Final    DG Swallowing Func-Speech Pathology  Result Date: 10/31/2022 Table formatting from the original result was not included. Images from the original result were not included. Objective Swallowing Evaluation: Type of Study: MBS-Modified Barium Swallow Study  Patient Details Name: Glen Martinez MRN: [id number] Date of Birth: November 11, 1930 Today's Date: 10/31/2022 Time: SLP Start Time (ACUTE ONLY): 0940 -SLP Stop Time (ACUTE ONLY): 1000 SLP Time Calculation (min) (ACUTE ONLY): 20 min Past Medical History: Past Medical History: Diagnosis Date  CVA (cerebral vascular accident) ([place]) 07/09/2021  No deficits  Diabetes mellitus without complication (HCC)   GERD (gastroesophageal reflux disease)   Gout   Hyperlipidemia   Hypertension   Hypothyroidism   PAF (paroxysmal atrial fibrillation) ([place])   Wears dentures   partial upper and lower Past Surgical History: Past Surgical History: Procedure Laterality Date   CATARACT EXTRACTION W/PHACO Left 05/02/2018  Procedure: CATARACT EXTRACTION PHACO AND INTRAOCULAR LENS PLACEMENT ([place]) DIABETIC;  Surgeon: Leandrew Koyanagi, MD;  Location: [facility];  Service: Ophthalmology;  Laterality: Left;  diabetic - insulin  CATARACT EXTRACTION W/PHACO Right 09/21/2022  Procedure: CATARACT EXTRACTION PHACO AND INTRAOCULAR LENS PLACEMENT ([facility]) RIGHT 12.41 01:15.0;  Surgeon: Leandrew Koyanagi, MD;  Location: [place];  Service: Ophthalmology;  Laterality: Right;  Diabetic  COLONOSCOPY    HERNIA REPAIR    LOOP RECORDER INSERTION N/A 07/12/2021  Procedure: LOOP RECORDER INSERTION;  Surgeon: Evans Lance, MD;  Location: [facility];  Service: Cardiovascular;  Laterality: N/A;  TONSILLECTOMY   HPI: Pt is a 89 y.o. male who presented to the ED for evaluation of  left-sided weakness, slurred speech, and left facial droop. MRI brain (1/19) revealed "shows a large area of acute ischemia within the posterior right MCA territory.  Petechial hemorrhage within the infarcted territory noted". SLE (07/10/21) post CVA significant for cognitive communication deficits with SLUMS score of 13/30. Passed [facility] 10/19/2022, however RN reports concern for pocketing of pills. BSE and SLE ordered. PMH: PAF on Coumadin, history of CVA, insulin-dependent T2DM, CKD stage IIIa, HTN, HLD, hypothyroidism, BPH, OSA on CPAP.  No data recorded  Recommendations for follow up therapy are one component of a multi-disciplinary discharge planning process, led by the attending physician.  Recommendations may be updated based on patient status, additional functional criteria and insurance authorization. Assessment / Plan / Recommendation   10/31/2022  10:37 AM Clinical Impressions Clinical Impression Pt presents with oropharyngeal dysphagia post CVA, marked by impaired mastication, poor bolus control, premature loss of liquids, reduced epiglottic inversion, reduced anterior hyoid excursion and delayed  swallow initation, resulting in sensed aspiration of thin liquids (PAS 7). Chin tuck and smaller volumes not effective in eliminating aspiration with this consistency. NTL by cup/straw resulted in x1 occurance of penetration below the cords, with material fully ejected spontaneously from airway with throat clear (PAS 6). Of note, poor BOT retraction and pharyngeal stripping resulted in min-mod residuals in vallecula and pyriform sinuses across trials, increasing with thicker liquid and regular solid consistencies. With a teaspoon of HTL, silent aspiration (PAS 8) after the swallow occured when pyriform residuals spilled into airway posteriorly. Effortful swallows, repeat dry swallows and NTL wash assisted with clearance of majority of oropharyngeal residuals across trials. Recommend continue dys 1 diet with NTL (cup or straw ok) with adherence to swallow precautions as indicated. Will f/u for tolerance. SLP Visit Diagnosis Dysphagia, oropharyngeal phase (R13.12) Impact on safety and function Moderate aspiration risk     10/31/2022  10:37 AM Treatment Recommendations Treatment Recommendations Therapy as outlined in treatment plan below     10/31/2022  10:37 AM Prognosis Prognosis for Safe Diet Advancement Good Barriers to Reach Goals Severity of deficits;Cognitive deficits   10/31/2022  10:37 AM Diet Recommendations SLP Diet Recommendations Dysphagia 1 (Puree) solids;Nectar thick liquid Liquid Administration via Cup;Straw Medication Administration Crushed with puree Compensations Minimize environmental distractions;Slow rate;Small sips/bites;Lingual sweep for clearance of pocketing;Monitor for anterior loss;Multiple dry swallows after each bite/sip;Follow solids with liquid;Clear throat intermittently;Effortful swallow Postural Changes Seated upright at 90 degrees     10/31/2022  10:37 AM Other Recommendations Oral Care Recommendations Oral care BID Other Recommendations Order thickener from pharmacy Follow Up  Recommendations Skilled nursing-short term rehab (<3 hours/day) Functional Status Assessment Patient has had a recent decline in their functional status and demonstrates the ability to make significant improvements in function in a reasonable and predictable amount of time.   10/31/2022  10:37 AM Frequency and Duration  Speech Therapy Frequency (ACUTE ONLY) min 2x/week Treatment Duration 2 weeks     10/31/2022  10:37 AM Oral Phase Oral Phase Impaired Oral - Honey Teaspoon Lingual/palatal residue Oral - Nectar Teaspoon Lingual/palatal residue;Left anterior bolus loss;Left pocketing in lateral sulci;Decreased bolus cohesion Oral - Nectar Cup Lingual/palatal residue;Left anterior bolus loss;Left pocketing in lateral sulci;Decreased bolus cohesion Oral - Nectar Straw Lingual/palatal residue;Left anterior bolus loss;Left pocketing in lateral sulci;Decreased bolus cohesion Oral - Thin Teaspoon Lingual/palatal residue;Left anterior bolus loss;Left pocketing in lateral sulci;Decreased bolus cohesion;Premature spillage Oral - Thin Cup Lingual/palatal residue;Left anterior bolus loss;Left pocketing in lateral sulci;Decreased bolus cohesion;Premature spillage Oral - Thin Straw Lingual/palatal residue;Left anterior  bolus loss;Left pocketing in lateral sulci;Decreased bolus cohesion;Premature spillage Oral - Puree Weak lingual manipulation;Lingual/palatal residue;Left pocketing in lateral sulci Oral - Regular Weak lingual manipulation;Lingual/palatal residue;Left pocketing in lateral sulci;Impaired mastication;Reduced posterior propulsion Oral - Pill NT    10/31/2022  10:37 AM Pharyngeal Phase Pharyngeal Phase Impaired Pharyngeal- Honey Teaspoon Delayed swallow initiation-pyriform sinuses;Reduced epiglottic inversion;Reduced anterior laryngeal mobility;Penetration/Apiration after swallow;Moderate aspiration;Pharyngeal residue - valleculae;Pharyngeal residue - pyriform;Pharyngeal residue - posterior pharnyx;Reduced tongue base  retraction;Reduced pharyngeal peristalsis Pharyngeal Material enters airway, passes BELOW cords without attempt by patient to eject out (silent aspiration) Pharyngeal- Nectar Teaspoon Delayed swallow initiation-pyriform sinuses;Reduced epiglottic inversion;Reduced anterior laryngeal mobility;Pharyngeal residue - valleculae;Pharyngeal residue - pyriform;Pharyngeal residue - posterior pharnyx;Reduced tongue base retraction;Reduced pharyngeal peristalsis;Penetration/Aspiration during swallow Pharyngeal- Nectar Cup Delayed swallow initiation-pyriform sinuses;Reduced epiglottic inversion;Reduced anterior laryngeal mobility;Pharyngeal residue - valleculae;Pharyngeal residue - pyriform;Pharyngeal residue - posterior pharnyx;Reduced tongue base retraction;Reduced pharyngeal peristalsis;Penetration/Aspiration during swallow Pharyngeal Material enters airway, passes BELOW cords then ejected out Pharyngeal- Nectar Straw Delayed swallow initiation-pyriform sinuses;Reduced epiglottic inversion;Reduced anterior laryngeal mobility;Pharyngeal residue - valleculae;Pharyngeal residue - pyriform;Pharyngeal residue - posterior pharnyx;Reduced tongue base retraction;Reduced pharyngeal peristalsis Pharyngeal Material does not enter airway Pharyngeal- Thin Teaspoon Delayed swallow initiation-pyriform sinuses;Reduced epiglottic inversion;Reduced anterior laryngeal mobility;Pharyngeal residue - valleculae;Pharyngeal residue - pyriform;Pharyngeal residue - posterior pharnyx;Reduced tongue base retraction;Reduced pharyngeal peristalsis;Penetration/Aspiration during swallow;Trace aspiration Pharyngeal Material enters airway, passes BELOW cords and not ejected out despite cough attempt by patient Pharyngeal- Thin Cup Delayed swallow initiation-pyriform sinuses;Reduced epiglottic inversion;Reduced anterior laryngeal mobility;Pharyngeal residue - valleculae;Pharyngeal residue - pyriform;Pharyngeal residue - posterior pharnyx;Reduced tongue base  retraction;Reduced pharyngeal peristalsis;Penetration/Aspiration during swallow;Trace aspiration Pharyngeal Material enters airway, passes BELOW cords and not ejected out despite cough attempt by patient Pharyngeal- Thin Straw Delayed swallow initiation-pyriform sinuses;Reduced epiglottic inversion;Reduced anterior laryngeal mobility;Pharyngeal residue - valleculae;Pharyngeal residue - pyriform;Pharyngeal residue - posterior pharnyx;Reduced tongue base retraction;Reduced pharyngeal peristalsis;Penetration/Aspiration during swallow;Trace aspiration Pharyngeal Material enters airway, passes BELOW cords and not ejected out despite cough attempt by patient Pharyngeal- Puree Delayed swallow initiation-pyriform sinuses;Reduced epiglottic inversion;Reduced anterior laryngeal mobility;Pharyngeal residue - valleculae;Pharyngeal residue - pyriform;Pharyngeal residue - posterior pharnyx;Reduced tongue base retraction;Reduced pharyngeal peristalsis Pharyngeal- Regular Delayed swallow initiation-pyriform sinuses;Reduced epiglottic inversion;Reduced anterior laryngeal mobility;Pharyngeal residue - valleculae;Pharyngeal residue - pyriform;Pharyngeal residue - posterior pharnyx;Reduced tongue base retraction;Reduced pharyngeal peristalsis Pharyngeal- Pill NT     No data to display    Ellwood Dense, MA, [place] Office Number: [phone] Acie Fredrickson 10/31/2022, 11:09 AM                     ECHOCARDIOGRAM COMPLETE  Result Date: 10/29/2022    ECHOCARDIOGRAM REPORT   Patient Name:   Glen Martinez Date of Exam: 10/29/2022 Medical Rec #:  [id number]       Height:       66.0 in Accession #:    [id number]      Weight:       160.0 lb Date of Birth:  1931-08-29      BSA:          1.819 m Patient Age:    40 years        BP:           149/79 mmHg Patient Gender: M               HR:           118 bpm. Exam Location:  Inpatient Procedure: 2D Echo Indications:    stroke  History:  Patient has prior  history of Echocardiogram examinations, most                 recent 07/10/2021. Stroke, Arrythmias:Atrial Fibrillation; Risk                 Factors:Hypertension, Diabetes and Dyslipidemia.  Sonographer:    Harvie Junior Referring Phys: [id number] [place]  1. In Afib with RVR during study. Left ventricular ejection fraction, by estimation, is 50 to 55%. The left ventricle has low normal function. The left ventricle has no regional wall motion abnormalities. There is mild left ventricular hypertrophy. Left  ventricular diastolic parameters are indeterminate.  2. Right ventricular systolic function is normal. The right ventricular size is normal. There is normal pulmonary artery systolic pressure. The estimated right ventricular systolic pressure is 34.7 mmHg.  3. The mitral valve is normal in structure. Trivial mitral valve regurgitation.  4. The aortic valve is tricuspid. Aortic valve regurgitation is mild. Aortic valve sclerosis is present, with no evidence of aortic valve stenosis.  5. The inferior vena cava is normal in size with greater than 50% respiratory variability, suggesting right atrial pressure of 3 mmHg. FINDINGS  Left Ventricle: Left ventricular ejection fraction, by estimation, is 50 to 55%. The left ventricle has low normal function. The left ventricle has no regional wall motion abnormalities. The left ventricular internal cavity size was normal in size. There is mild left ventricular hypertrophy. Left ventricular diastolic parameters are indeterminate. Right Ventricle: The right ventricular size is normal. No increase in right ventricular wall thickness. Right ventricular systolic function is normal. There is normal pulmonary artery systolic pressure. The tricuspid regurgitant velocity is 2.33 m/s, and  with an assumed right atrial pressure of 3 mmHg, the estimated right ventricular systolic pressure is 42.5 mmHg. Left Atrium: Left atrial size was normal in size. Right Atrium: Right  atrial size was normal in size. Pericardium: There is no evidence of pericardial effusion. Mitral Valve: The mitral valve is normal in structure. Trivial mitral valve regurgitation. Tricuspid Valve: The tricuspid valve is normal in structure. Tricuspid valve regurgitation is trivial. Aortic Valve: The aortic valve is tricuspid. Aortic valve regurgitation is mild. Aortic regurgitation PHT measures 579 msec. Aortic valve sclerosis is present, with no evidence of aortic valve stenosis. Aortic valve mean gradient measures 1.2 mmHg. Aortic valve peak gradient measures 2.1 mmHg. Aortic valve area, by VTI measures 2.83 cm. Pulmonic Valve: The pulmonic valve was not well visualized. Pulmonic valve regurgitation is trivial. Aorta: The aortic root and ascending aorta are structurally normal, with no evidence of dilitation. Venous: The inferior vena cava is normal in size with greater than 50% respiratory variability, suggesting right atrial pressure of 3 mmHg. IAS/Shunts: The interatrial septum was not well visualized.  LEFT VENTRICLE PLAX 2D LVIDd:         4.30 cm     Diastology LVIDs:         3.20 cm     LV e' medial:    6.05 cm/s LV PW:         1.20 cm     LV E/e' medial:  12.9 LV IVS:        1.20 cm     LV e' lateral:   8.50 cm/s LVOT diam:     2.00 cm     LV E/e' lateral: 9.2 LV SV:         34 LV SV Index:   19 LVOT Area:  3.14 cm  LV Volumes (MOD) LV vol d, MOD A2C: 49.0 ml LV vol d, MOD A4C: 57.5 ml LV vol s, MOD A2C: 23.2 ml LV vol s, MOD A4C: 28.6 ml LV SV MOD A2C:     25.8 ml LV SV MOD A4C:     57.5 ml LV SV MOD BP:      29.0 ml RIGHT VENTRICLE RV Basal diam:  2.90 cm RV Mid diam:    2.40 cm RV S prime:     11.30 cm/s TAPSE (M-mode): 1.5 cm LEFT ATRIUM             Index        RIGHT ATRIUM           Index LA diam:        4.10 cm 2.25 cm/m   RA Area:     11.60 cm LA Vol (A2C):   35.7 ml 19.63 ml/m  RA Volume:   25.00 ml  13.74 ml/m LA Vol (A4C):   56.3 ml 30.95 ml/m LA Biplane Vol: 45.9 ml 25.24 ml/m   AORTIC VALVE                    PULMONIC VALVE AV Area (Vmax):    2.77 cm     PV Vmax:          0.72 m/s AV Area (Vmean):   2.74 cm     PV Peak grad:     2.1 mmHg AV Area (VTI):     2.83 cm     PR End Diast Vel: 5.57 msec AV Vmax:           72.40 cm/s AV Vmean:          52.450 cm/s AV VTI:            0.119 m AV Peak Grad:      2.1 mmHg AV Mean Grad:      1.2 mmHg LVOT Vmax:         63.92 cm/s LVOT Vmean:        45.680 cm/s LVOT VTI:          0.107 m LVOT/AV VTI ratio: 0.90 AI PHT:            579 msec  AORTA Ao Root diam: 3.60 cm Ao Asc diam:  3.20 cm MITRAL VALVE               TRICUSPID VALVE MV Area (PHT): 3.39 cm    TR Peak grad:   21.7 mmHg MV Decel Time: 224 msec    TR Vmax:        233.00 cm/s MR Peak grad: 28.3 mmHg MR Vmax:      266.00 cm/s  SHUNTS MV E velocity: 78.00 cm/s  Systemic VTI:  0.11 m MV A velocity: 20.10 cm/s  Systemic Diam: 2.00 cm MV E/A ratio:  3.88 Oswaldo Milian MD Electronically signed by Oswaldo Milian MD Signature Date/Time: 10/29/2022/3:45:51 PM    Final    MR ANGIO HEAD WO CONTRAST  Result Date: 10/23/2022 CLINICAL DATA:  Acute neurologic deficit EXAM: MRA HEAD WITHOUT CONTRAST TECHNIQUE: Angiographic images of the Circle of Willis were acquired using MRA technique without intravenous contrast. COMPARISON:  None Available. FINDINGS: POSTERIOR CIRCULATION: --Vertebral arteries: Normal --Inferior cerebellar arteries: Normal. --Basilar artery: Normal. --Superior cerebellar arteries: Normal. --Posterior cerebral arteries: Normal. ANTERIOR CIRCULATION: --Intracranial internal carotid arteries: Normal. --Anterior cerebral arteries (ACA): Normal. --Middle cerebral arteries (MCA): Normal. IMPRESSION: Normal  intracranial MRA. Electronically Signed   By: Ulyses Jarred M.D.   On: 10/23/2022 23:59   CT Head Wo Contrast  Result Date: 10/10/2022 CLINICAL DATA:  Neurologic deficit.  Stroke suspected. EXAM: CT HEAD WITHOUT CONTRAST TECHNIQUE: Contiguous axial images were obtained  from the base of the skull through the vertex without intravenous contrast. RADIATION DOSE REDUCTION: This exam was performed according to the departmental dose-optimization program which includes automated exposure control, adjustment of the mA and/or kV according to patient size and/or use of iterative reconstruction technique. COMPARISON:  Head CT dated 07/10/2021. FINDINGS: Evaluation of this exam is limited due to motion artifact. Brain: An area of hypodensity in the right posterior temporal and parietal lobes consistent with edema and infarct. There is minimal associated mass effect on the adjacent brain parenchyma. No midline shift. Areas of slight higher attenuation within the infarct suspicious for petechial hemorrhage. Evaluation is limited due to motion. There is background of mild age-related atrophy and chronic microvascular ischemic changes. Vascular: Suboptimally evaluated due to motion. Skull: Normal. Negative for fracture or focal lesion. Sinuses/Orbits: Partial opacification of a left ethmoid air cells. The remainder of the visualized paranasal sinuses and mastoid air cells are clear. Other: None IMPRESSION: 1. Right MCA territory infarct with findings suspicious for small petechial hemorrhage. No midline shift. 2. Mild age-related atrophy and chronic microvascular ischemic changes. These results were called by telephone at the time of interpretation on 11/07/2022 at 9:32 pm to Dr Alvino Chapel, who verbally acknowledged these results. Electronically Signed   By: Anner Crete M.D.   On: 11/04/2022 21:34   MR BRAIN WO CONTRAST  Result Date: 11/06/2022 CLINICAL DATA:  Acute neurologic deficit EXAM: MRI HEAD WITHOUT CONTRAST TECHNIQUE: Multiplanar, multiecho pulse sequences of the brain and surrounding structures were obtained without intravenous contrast. COMPARISON:  07/09/2021 FINDINGS: Brain: There is a large area of acute ischemia within the posterior right MCA territory. There is petechial  hemorrhage within the infarcted territory. 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 There is multifocal hyperintense T2-weighted signal within the periventricular and deep white matter. Old right PCA territory infarct. Normal midline structures. Vascular: Normal flow voids. Skull and upper cervical spine: Normal marrow signal. Sinuses/Orbits: Paranasal sinuses are clear. No mastoid effusion. Normal orbits. Bilateral ocular lens replacements. Other: Negative IMPRESSION: 1. Large area of acute ischemia within the posterior right MCA territory. Petechial hemorrhage within the infarcted territory. No mass effect or midline shift. Heidelberg classification 1a: HI1, scattered small petechiae, no mass effect. 2. Old right PCA territory infarct. Electronically Signed   By: Ulyses Jarred M.D.   On: 11/08/2022 21:25   CT Cervical Spine Wo Contrast  Result Date: 11/09/2022 CLINICAL DATA:  Neck trauma EXAM: CT CERVICAL SPINE WITHOUT CONTRAST TECHNIQUE: Multidetector CT imaging of the cervical spine was performed without intravenous contrast. Multiplanar CT image reconstructions were also generated. RADIATION DOSE REDUCTION: This exam was performed according to the departmental dose-optimization program which includes automated exposure control, adjustment of the mA and/or kV according to patient size and/or use of iterative reconstruction technique. COMPARISON:  None Available. FINDINGS: Alignment: Normal. Skull base and vertebrae: No acute fracture identified. No focal osseous lesion. Soft tissues and spinal canal: No prevertebral fluid or swelling. No visible canal hematoma. Disc levels: Large anterior osteophytes are seen at C4, C5, C6 and C7. Disc spaces are preserved. No severe central canal or neural foraminal stenosis identified at any level. Upper chest: Negative. Other: None. IMPRESSION: 1. No acute fracture or  subluxation of the cervical spine. 2. Degenerative changes of the cervical  spine. Electronically Signed   By: Ronney Asters M.D.   On: 10/20/2022 20:51   DG Chest Port 1 View  Result Date: 10/30/2022 CLINICAL DATA:  Pneumonia and cough EXAM: PORTABLE CHEST 1 VIEW COMPARISON:  12/12/2019 FINDINGS: Loop recorder is present. Slightly shallow inspiration. Heart size and pulmonary vascularity are normal. Lungs are clear. No pleural effusions. No pneumothorax. Mediastinal contours appear intact. IMPRESSION: No active disease. Electronically Signed   By: Lucienne Capers M.D.   On: 10/12/2022 19:46   DG Knee 2 Views Left  Result Date: 10/13/2022 CLINICAL DATA:  Fall EXAM: LEFT KNEE - 1-2 VIEW COMPARISON:  None Available. FINDINGS: There is no acute fracture or dislocation. Joint spaces are well maintained. There is no joint effusion. Peripheral vascular calcifications are present. There is bubbly cortical lucency in the proximal femoral diaphysis measuring 2.4 x 0.5 cm. IMPRESSION: 1. No acute fracture or dislocation. 2. Bubbly cortical lucency in the proximal femoral diaphysis measuring 2.4 x 0.5 cm. This is nonspecific and may represent a nonossifying fibroma. Electronically Signed   By: Ronney Asters M.D.   On: 10/10/2022 19:10   CUP PACEART REMOTE DEVICE CHECK  Result Date: 10/05/2022 ILR summary report received. Battery status OK. Normal device function. No new symptom, tachy, brady, or pause episodes. Persistent AF/AFL, controlled rates, burden 86.6%, Eliquis Monthly summary reports and ROV/PRN LA   Microbiology Recent Results (from the past 240 hour(s))  Urine Culture     Status: None   Collection Time: 10/26/2022  8:14 PM   Specimen: Urine, Clean Catch  Result Value Ref Range Status   Specimen Description URINE, CLEAN CATCH  Final   Special Requests NONE  Final   Culture   Final    NO GROWTH Performed at [facility], [place] [address], [place], [place] [address]    Report Status 10/30/2022 FINAL  Final  Blood culture (routine x 2)     Status: None  (Preliminary result)   Collection Time: 10/29/22  6:53 PM   Specimen: BLOOD  Result Value Ref Range Status   Specimen Description BLOOD SITE NOT SPECIFIED  Final   Special Requests   Final    BOTTLES DRAWN AEROBIC AND ANAEROBIC Blood Culture adequate volume   Culture   Final    NO GROWTH 4 DAYS Performed at [facility], [place] [address], [place], [place] [address]    Report Status PENDING  Incomplete  Blood culture (routine x 2)     Status: None (Preliminary result)   Collection Time: 10/29/22  6:53 PM   Specimen: BLOOD  Result Value Ref Range Status   Specimen Description BLOOD SITE NOT SPECIFIED  Final   Special Requests   Final    BOTTLES DRAWN AEROBIC AND ANAEROBIC Blood Culture adequate volume   Culture   Final    NO GROWTH 4 DAYS Performed at [facility], [address], [place],  [address]    Report Status PENDING  Incomplete  MRSA Next Gen by PCR, Nasal     Status: None   Collection Time: 11-17-22 12:50 AM   Specimen: Nasal Mucosa; Nasal Swab  Result Value Ref Range Status   MRSA by PCR Next Gen NOT DETECTED NOT DETECTED Final    Comment: (NOTE) The GeneXpert MRSA Assay (FDA approved for NASAL specimens only), is one component of a comprehensive MRSA colonization surveillance program. It is not intended to  diagnose MRSA infection nor to guide or monitor treatment for MRSA infections. Test performance is not FDA approved in patients less than 38 years old. Performed at [facility], [place] [address], [place], [place] [address]     Lab Basic Metabolic Panel: Recent Labs  Lab 11/08/2022 2000 10/29/22 0507 10/30/22 0402 10/31/22 0315 2022/11/19 0120 11/19/2022 0121  NA 140 140 139 145 147* 144  K 3.5 3.4* 4.8 3.7 3.6 3.9  CL 101 104 109 111  --  113*  CO2 21* 22 18* 27  --  21*  GLUCOSE 196* 124* 103* 106*  --  118*  BUN 29* 37* 33* 25*  --  26*  CREATININE 2.29* 2.05* 1.50* 1.44*  --  1.60*  CALCIUM 8.8* 8.3* 8.1* 8.3*  --  8.3*   Liver  Function Tests: Recent Labs  Lab 10/11/2022 2000 10/29/22 0507 10/30/22 0402 10/31/22 0315 November 19, 2022 0121  AST 223* 198* 134* 86* 61*  ALT 59* 58* 54* 57* 50*  ALKPHOS 92 83 75 69 73  BILITOT 1.5* 0.9 1.4* 0.4 0.7  PROT 7.4 6.5 6.1* 5.9* 6.1*  ALBUMIN 4.0 3.6 3.2* 3.0* 3.1*   No results for input(s): "LIPASE", "AMYLASE" in the last 168 hours. No results for input(s): "AMMONIA" in the last 168 hours. CBC: Recent Labs  Lab 10/27/2022 1840 10/22/2022 1919 10/29/22 0507 10/30/22 0402 10/31/22 0315 Nov 19, 2022 0120 11-19-22 0121  WBC 22.6*  --  18.1* 14.8* 11.9*  --  10.5  NEUTROABS 19.6*  --   --   --   --   --   --   HGB 15.6   < > 14.5 14.2 12.9* 12.6* 13.5  HCT 47.4   < > 42.3 42.8 40.1 37.0* 41.9  MCV 93.9  --  91.6 94.9 95.5  --  96.1  PLT 199  --  164 142* 142*  --  128*   < > = values in this interval not displayed.   Cardiac Enzymes: Recent Labs  Lab 10/21/2022 2000 10/30/22 0402 10/31/22 0315  CKTOTAL 11,581* 4,099* 1,907*   Sepsis Labs: Recent Labs  Lab 10/29/22 0507 10/29/22 1853 10/29/22 2032 10/30/22 0402 10/31/22 0315 11/19/22 0121  WBC 18.1*  --   --  14.8* 11.9* 10.5  LATICACIDVEN  --  1.7 1.9  --   --   --     Procedures/Operations  Intubation   Julian Hy 11/02/2022, 9:28 PM

## 2022-11-10 NOTE — Progress Notes (Signed)
Transported patient from 3W to 2H15 without event.

## 2022-11-10 NOTE — Procedures (Signed)
Patient Name: Glen Martinez  MRN: [id number]  Epilepsy Attending: Lora Havens  Referring Physician/Provider: Amie Portland, MD  Date: 2022-11-20 Duration: 27.06 mins  Patient history: 89 year old male with syncope.  EEG to evaluate for seizure.  Level of alertness: comatose  AEDs during EEG study: Propofol  Technical aspects: This EEG study was done with scalp electrodes positioned according to the 10-20 International system of electrode placement. Electrical activity was reviewed with band pass filter of 1-'70Hz'$ , sensitivity of 7 uV/mm, display speed of 41m/sec with a '60Hz'$  notched filter applied as appropriate. EEG data were recorded continuously and digitally stored.  Video monitoring was available and reviewed as appropriate.  Description: EEG showed continuous generalized 3 to 5 Hz theta and delta slowing with overriding 12 to 15 Hz beta activity. Hyperventilation and photic stimulation were not performed.     ABNORMALITY - Continuous slow, generalized  IMPRESSION: This study is suggestive of severe diffuse encephalopathy, nonspecific to etiology but could be secondary to sedation.  No seizures or epileptiform discharges were seen throughout the recording.  Barbarajean Kinzler OBarbra Sarks

## 2022-11-10 NOTE — Procedures (Addendum)
Intubation Procedure Note  Glen Martinez  [id number]  05-08-1931  Date:November 26, 2022  Time:12:37 AM   Provider Performing:Khalfani Weideman A Mayreli Alden    Procedure: Intubation (26948)  Indication(s) Respiratory Failure  Consent Unable to obtain consent due to emergent nature of procedure.   Anesthesia Etomidate, Fentanyl, and Rocuronium 20 mg etomidate, 100 mcg fentanyl, 50 rocuronium  Time Out Verified patient identification, verified procedure, site/side was marked, verified correct patient position, special equipment/implants available, medications/allergies/relevant history reviewed, required imaging and test results available.   Sterile Technique Usual hand hygeine, masks, and gloves were used   Procedure Description Patient positioned in bed supine.  Sedation given as noted above.  Patient was intubated with endotracheal tube using Glidescope.  View was Grade 1 full glottis .  Number of attempts was 1.  Colorimetric CO2 detector was consistent with tracheal placement.   Complications/Tolerance None; patient tolerated the procedure well. Chest X-ray is ordered to verify placement.   EBL Minimal   Specimen(s) None

## 2022-11-10 NOTE — Progress Notes (Signed)
PT Cancellation Note  Patient Details Name: Glen Martinez MRN: [id number] DOB: December 05, 1930   Cancelled Treatment:    Reason Eval/Treat Not Completed: Medical issues which prohibited therapy Pt with acute change in status overnight and transferred to higher level of care with finding of new L CVA. Pt intubated at present.  Wyona Almas, PT, DPT Acute Rehabilitation Services Office [place] 11-29-2022, 10:50 AM

## 2022-11-10 NOTE — Progress Notes (Signed)
RN & Pt. Headed down to CT at this time. EEG to be placed later.

## 2022-11-10 DEATH — deceased

## 2023-05-05 IMAGING — CT CT ANGIO HEAD-NECK (W OR W/O PERF)
2 of 7 series · 8 of 33 positions shown · IV contrast (omnipaque)
Comparison: 07/09/2021 head CT and brain MRI

CLINICAL DATA: Acute neurologic deficit

EXAM:
CT ANGIOGRAPHY HEAD AND NECK
TECHNIQUE: Multidetector CT imaging of the head and neck was performed using
the standard protocol during bolus administration of intravenous
contrast. Multiplanar CT image reconstructions and MIPs were
obtained to evaluate the vascular anatomy. Carotid stenosis
measurements (when applicable) are obtained utilizing NASCET
criteria, using the distal internal carotid diameter as the
denominator.
CONTRAST:  75mL OMNIPAQUE IOHEXOL 350 MG/ML SOLN

[Series 5: cta neck/head · axial · 0.48mm/px · z∈[-332,-210]mm · 2 of 185 slices shown]
[im 62/185  soft-tissue]
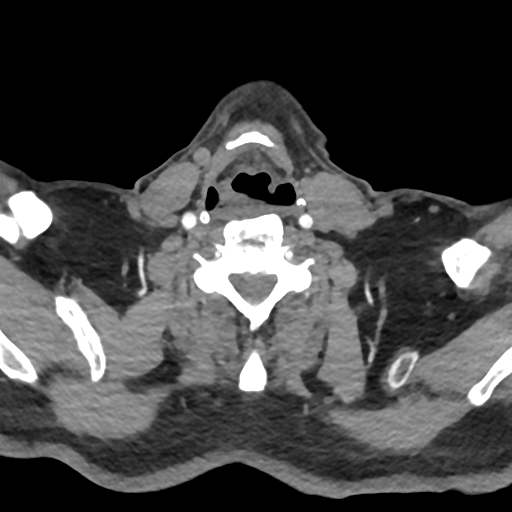
[im 123/185  soft-tissue]
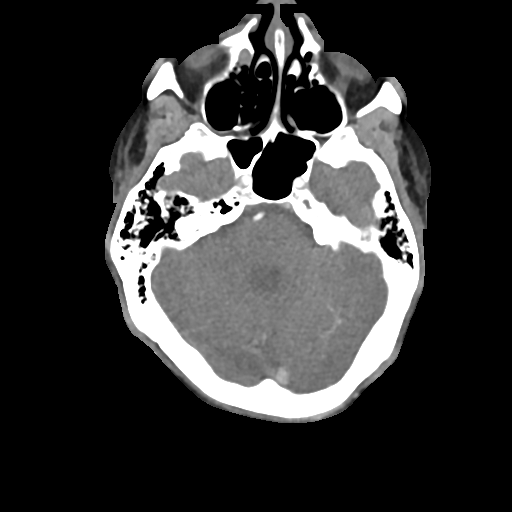

[Series 7: ax thins · axial · 0.39mm/px · z∈[-402,-138]mm · 6 of 370 slices shown]
[im 53/370  soft-tissue]
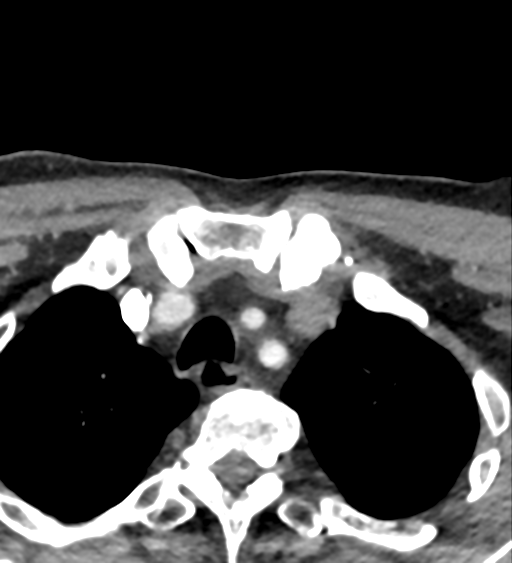
[im 106/370  bone]
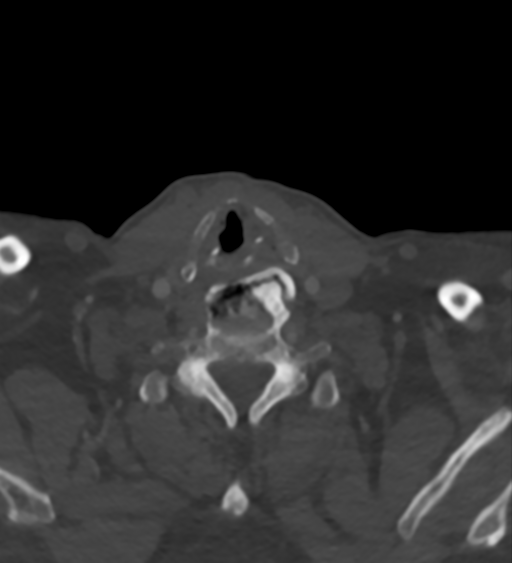
[im 159/370  soft-tissue]
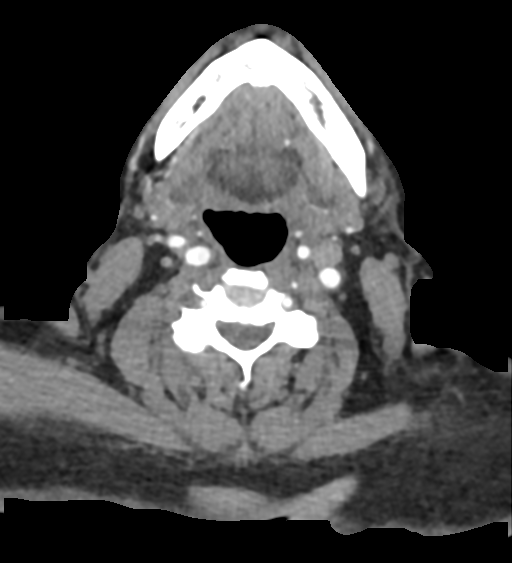
[im 211/370  bone]
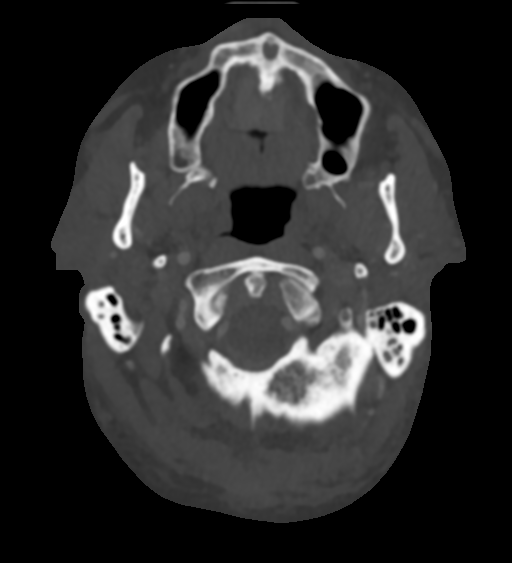
[im 264/370  soft-tissue]
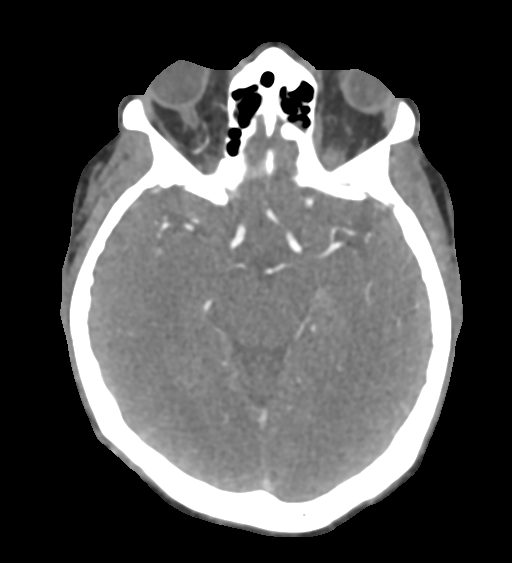
[im 317/370  bone]
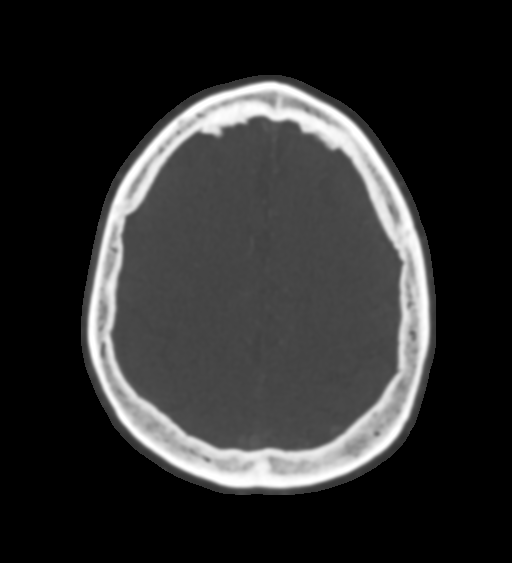

[8 of 33 positions shown; findings below may reference images not displayed]

FINDINGS: CTA NECK FINDINGS

SKELETON: There is no bony spinal canal stenosis. No lytic or
blastic lesion.

OTHER NECK: Normal pharynx, larynx and major salivary glands. No
cervical lymphadenopathy. Unremarkable thyroid gland.

UPPER CHEST: No pneumothorax or pleural effusion. No nodules or
masses.

AORTIC ARCH:

There is calcific atherosclerosis of the aortic arch. There is no
aneurysm, dissection or hemodynamically significant stenosis of the
visualized portion of the aorta. Conventional 3 vessel aortic
branching pattern. The visualized proximal subclavian arteries are
widely patent.

RIGHT CAROTID SYSTEM: Normal without aneurysm, dissection or
stenosis.

LEFT CAROTID SYSTEM: Normal without aneurysm, dissection or
stenosis.

VERTEBRAL ARTERIES: Left dominant configuration. Both origins are
clearly patent. There is no dissection, occlusion or flow-limiting
stenosis to the skull base (V1-V3 segments).

CTA HEAD FINDINGS

POSTERIOR CIRCULATION:

--Vertebral arteries: Normal V4 segments.

--Inferior cerebellar arteries: Normal.

--Basilar artery: Normal.

--Superior cerebellar arteries: Normal.

--Posterior cerebral arteries (PCA): Right PCA may be occluded at
the distal right P3 segment. Left PCA is patent.

ANTERIOR CIRCULATION:

--Intracranial internal carotid arteries: Atherosclerotic
calcification of the internal carotid arteries at the skull base
without hemodynamically significant stenosis.

--Anterior cerebral arteries (ACA): Normal. Both A1 segments are
present. Patent anterior communicating artery (a-comm).

--Middle cerebral arteries (MCA): Normal.

VENOUS SINUSES: As permitted by contrast timing, patent.

ANATOMIC VARIANTS: None

Review of the MIP images confirms the above findings.
IMPRESSION: 1. No emergent large vessel occlusion or high-grade stenosis of the
intracranial arteries.
2. Possible occlusion of the distal right P3 segment.

Aortic Atherosclerosis ([id number]).

## 2023-06-29 ENCOUNTER — Ambulatory Visit: Payer: Medicare HMO | Admitting: Urology
